# Patient Record
Sex: Male | Born: 1975 | Race: White | Hispanic: No | Marital: Married | Smoking: Never smoker
Health system: Southern US, Community
[De-identification: ages and names within clinical notes are randomized; demographics above are authoritative.]

## PROBLEM LIST (undated history)

## (undated) DIAGNOSIS — E119 Type 2 diabetes mellitus without complications: Secondary | ICD-10-CM

## (undated) DIAGNOSIS — E1142 Type 2 diabetes mellitus with diabetic polyneuropathy: Secondary | ICD-10-CM

## (undated) DIAGNOSIS — Z800 Family history of malignant neoplasm of digestive organs: Secondary | ICD-10-CM

## (undated) DIAGNOSIS — K602 Anal fissure, unspecified: Secondary | ICD-10-CM

## (undated) DIAGNOSIS — Z860100 Personal history of colon polyps, unspecified: Secondary | ICD-10-CM

## (undated) DIAGNOSIS — K219 Gastro-esophageal reflux disease without esophagitis: Secondary | ICD-10-CM

## (undated) DIAGNOSIS — T7840XA Allergy, unspecified, initial encounter: Secondary | ICD-10-CM

## (undated) HISTORY — DX: Personal history of colon polyps, unspecified: Z86.0100

## (undated) HISTORY — DX: Anal fissure, unspecified: K60.2

## (undated) HISTORY — DX: Allergy, unspecified, initial encounter: T78.40XA

## (undated) HISTORY — DX: Family history of malignant neoplasm of digestive organs: Z80.0

## (undated) HISTORY — DX: Type 2 diabetes mellitus with diabetic polyneuropathy: E11.42

## (undated) HISTORY — DX: Personal history of colonic polyps: Z86.010

---
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
## 2004-07-22 ENCOUNTER — Emergency Department: Payer: Self-pay | Admitting: Unknown Physician Specialty

## 2006-02-22 ENCOUNTER — Ambulatory Visit: Payer: Self-pay | Admitting: Unknown Physician Specialty

## 2013-11-30 ENCOUNTER — Ambulatory Visit: Payer: Self-pay

## 2013-11-30 LAB — RAPID STREP-A WITH REFLX: Micro Text Report: NEGATIVE

## 2013-11-30 LAB — CBC WITH DIFFERENTIAL/PLATELET
Basophil #: 0.1 10*3/uL (ref 0.0–0.1)
Basophil %: 0.5 %
Eosinophil #: 0.2 10*3/uL (ref 0.0–0.7)
Eosinophil %: 1.5 %
HCT: 46.9 % (ref 40.0–52.0)
HGB: 16 g/dL (ref 13.0–18.0)
Lymphocyte #: 3 10*3/uL (ref 1.0–3.6)
Lymphocyte %: 24.8 %
MCH: 29.4 pg (ref 26.0–34.0)
MCHC: 34 g/dL (ref 32.0–36.0)
MCV: 86 fL (ref 80–100)
Monocyte #: 0.7 x10 3/mm (ref 0.2–1.0)
Monocyte %: 6 %
Neutrophil #: 8.1 10*3/uL — ABNORMAL HIGH (ref 1.4–6.5)
Neutrophil %: 67.2 %
Platelet: 287 10*3/uL (ref 150–440)
RBC: 5.44 10*6/uL (ref 4.40–5.90)
RDW: 13.3 % (ref 11.5–14.5)
WBC: 12.1 10*3/uL — ABNORMAL HIGH (ref 3.8–10.6)

## 2013-11-30 LAB — MONONUCLEOSIS SCREEN: Mono Test: NEGATIVE

## 2013-12-03 LAB — BETA STREP CULTURE(ARMC)

## 2014-06-19 ENCOUNTER — Ambulatory Visit (INDEPENDENT_AMBULATORY_CARE_PROVIDER_SITE_OTHER): Payer: 59 | Admitting: Cardiovascular Disease

## 2014-06-19 ENCOUNTER — Encounter (INDEPENDENT_AMBULATORY_CARE_PROVIDER_SITE_OTHER): Payer: Self-pay

## 2014-06-19 ENCOUNTER — Encounter: Payer: Self-pay | Admitting: Cardiovascular Disease

## 2014-06-19 VITALS — BP 151/108 | HR 71 | Ht 76.0 in | Wt 256.8 lb

## 2014-06-19 DIAGNOSIS — IMO0001 Reserved for inherently not codable concepts without codable children: Secondary | ICD-10-CM

## 2014-06-19 DIAGNOSIS — R030 Elevated blood-pressure reading, without diagnosis of hypertension: Secondary | ICD-10-CM

## 2014-06-19 DIAGNOSIS — E785 Hyperlipidemia, unspecified: Secondary | ICD-10-CM

## 2014-06-19 DIAGNOSIS — Z8349 Family history of other endocrine, nutritional and metabolic diseases: Secondary | ICD-10-CM

## 2014-06-19 DIAGNOSIS — Z83438 Family history of other disorder of lipoprotein metabolism and other lipidemia: Secondary | ICD-10-CM

## 2014-06-19 DIAGNOSIS — I10 Essential (primary) hypertension: Secondary | ICD-10-CM

## 2014-06-19 NOTE — Assessment & Plan Note (Signed)
It is possible that he might have white coat syndrome. He also has not had any routine labs done in the last few years. Thus, I ordered CBC, basic metabolic profile and liver profile. I asked him to check his blood pressure at home twice weekly over the next few weeks and provide [place] with readings. If blood pressure is elevated at home, I recommend lifestyle changes with low-sodium diet, exercise and weight loss.

## 2014-06-19 NOTE — Progress Notes (Signed)
   HPI  Tanner Frye is a pleasant 38 year old male who is here to establish cardiovascular care. He has no previous cardiac history on no chronic medical conditions. He has family history of hypertension and hyperlipidemia but no premature coronary artery disease. He has known history of GERD. He is not a smoker. He denies any chest pain, shortness of breath, palpitations or dizziness. He does not have a primary care physician.  Allergies  Allergen Reactions  . Bee Venom   . Prednisone     Other reaction(s): Other (See Comments) Effects mood verbal per patient     No current outpatient prescriptions on file prior to visit.   No current facility-administered medications on file prior to visit.     History reviewed. No pertinent past medical history.   Past Surgical History  Procedure Laterality Date  . Hernia repair       Family History  Problem Relation Age of Onset  . Hypertension Mother   . Hyperlipidemia Mother      History   Social History  . Marital Status: Married    Spouse Name: N/A    Number of Children: N/A  . Years of Education: N/A   Occupational History  . Not on file.   Social History Main Topics  . Smoking status: Never Smoker   . Smokeless tobacco: Not on file  . Alcohol Use: Yes     Comment: social  . Drug Use: No  . Sexual Activity: Not on file   Other Topics Concern  . Not on file   Social History Narrative  . No narrative on file     ROS A 10 point review of system was performed. It is negative other than that mentioned in the history of present illness.   PHYSICAL EXAM   BP 151/108  Pulse 71  Ht 6\' 4"  (1.93 m)  Wt 256 lb 12 oz (116.461 kg)  BMI 31.27 kg/m2 Constitutional: He is oriented to person, place, and time. He appears well-developed and well-nourished. No distress.  HENT: No nasal discharge.  Head: Normocephalic and atraumatic.  Eyes: Pupils are equal and round.  No discharge. Neck: Normal range of motion. Neck supple.  No JVD present. No thyromegaly present.  Cardiovascular: Normal rate, regular rhythm, normal heart sounds. Exam reveals no gallop and no friction rub. No murmur heard.  Pulmonary/Chest: Effort normal and breath sounds normal. No stridor. No respiratory distress. He has no wheezes. He has no rales. He exhibits no tenderness.  Abdominal: Soft. Bowel sounds are normal. He exhibits no distension. There is no tenderness. There is no rebound and no guarding.  Musculoskeletal: Normal range of motion. He exhibits no edema and no tenderness.  Neurological: He is alert and oriented to person, place, and time. Coordination normal.  Skin: Skin is warm and dry. No rash noted. He is not diaphoretic. No erythema. No pallor.  Psychiatric: He has a normal mood and affect. His behavior is normal. Judgment and thought content normal.       UXN:ATFTD  Rhythm  -RSR(V1) -nondiagnostic.   PROBABLY NORMAL   ASSESSMENT AND PLAN

## 2014-06-19 NOTE — Assessment & Plan Note (Signed)
I requested a fasting lipid profile.

## 2014-06-19 NOTE — Patient Instructions (Signed)
Your physician recommends that you have labs today: CBC BMP Lipid and Liver Panel   Your physician wants you to follow-up in: 1 year with Dr. Fletcher Anon. You will receive a reminder letter in the mail two months in advance. If you don't receive a letter, please call our office to schedule the follow-up appointment.  Please check your blood pressure at home twice a week for 1 month and send [place] your results

## 2014-06-20 ENCOUNTER — Telehealth: Payer: Self-pay | Admitting: *Deleted

## 2014-06-20 DIAGNOSIS — E785 Hyperlipidemia, unspecified: Secondary | ICD-10-CM

## 2014-06-20 DIAGNOSIS — R945 Abnormal results of liver function studies: Secondary | ICD-10-CM

## 2014-06-20 LAB — CBC WITH DIFFERENTIAL
Basophils Absolute: 0 10*3/uL (ref 0.0–0.2)
Basos: 0 %
Eos: 1 %
Eosinophils Absolute: 0.2 10*3/uL (ref 0.0–0.4)
HCT: 43.2 % (ref 37.5–51.0)
Hemoglobin: 15 g/dL (ref 12.6–17.7)
Immature Grans (Abs): 0 10*3/uL (ref 0.0–0.1)
Immature Granulocytes: 0 %
Lymphocytes Absolute: 3.4 10*3/uL — ABNORMAL HIGH (ref 0.7–3.1)
Lymphs: 32 %
MCH: 29.3 pg (ref 26.6–33.0)
MCHC: 34.7 g/dL (ref 31.5–35.7)
MCV: 84 fL (ref 79–97)
Monocytes Absolute: 0.9 10*3/uL (ref 0.1–0.9)
Monocytes: 9 %
Neutrophils Absolute: 6.1 10*3/uL (ref 1.4–7.0)
Neutrophils Relative %: 58 %
Platelets: 268 10*3/uL (ref 150–379)
RBC: 5.12 x10E6/uL (ref 4.14–5.80)
RDW: 14.1 % (ref 12.3–15.4)
WBC: 10.7 10*3/uL (ref 3.4–10.8)

## 2014-06-20 LAB — LIPID PANEL
Chol/HDL Ratio: 5.8 ratio units — ABNORMAL HIGH (ref 0.0–5.0)
Cholesterol, Total: 244 mg/dL — ABNORMAL HIGH (ref 100–199)
HDL: 42 mg/dL (ref >39)
LDL Calculated: 124 mg/dL — ABNORMAL HIGH (ref 0–99)
Triglycerides: 391 mg/dL — ABNORMAL HIGH (ref 0–149)
VLDL Cholesterol Cal: 78 mg/dL — ABNORMAL HIGH (ref 5–40)

## 2014-06-20 LAB — HEPATIC FUNCTION PANEL
ALT: 80 IU/L — ABNORMAL HIGH (ref 0–44)
AST: 47 IU/L — ABNORMAL HIGH (ref 0–40)
Albumin: 4.5 g/dL (ref 3.5–5.5)
Alkaline Phosphatase: 41 IU/L (ref 39–117)
Bilirubin, Direct: 0.19 mg/dL (ref 0.00–0.40)
Total Bilirubin: 0.8 mg/dL (ref 0.0–1.2)
Total Protein: 6.8 g/dL (ref 6.0–8.5)

## 2014-06-20 LAB — BASIC METABOLIC PANEL
BUN/Creatinine Ratio: 13 (ref 8–19)
BUN: 11 mg/dL (ref 6–20)
CO2: 23 mmol/L (ref 18–29)
Calcium: 9.8 mg/dL (ref 8.7–10.2)
Chloride: 98 mmol/L (ref 97–108)
Creatinine, Ser: 0.88 mg/dL (ref 0.76–1.27)
GFR calc Af Amer: 127 mL/min/{1.73_m2} (ref >59)
GFR calc non Af Amer: 110 mL/min/{1.73_m2} (ref >59)
Glucose: 82 mg/dL (ref 65–99)
Potassium: 4.1 mmol/L (ref 3.5–5.2)
Sodium: 139 mmol/L (ref 134–144)

## 2014-06-20 MED ORDER — FISH OIL 1000 MG PO CPDR: 300.0000 | DELAYED_RELEASE_CAPSULE | Freq: Every day | ORAL | Status: DC

## 2014-06-20 NOTE — Telephone Encounter (Signed)
Message copied by Tracie Harrier on Wed Jun 20, 2014  9:45 AM ------      Message from: Kathlyn Sacramento A      Created: Wed Jun 20, 2014  9:03 AM       Inform patient that labs were normal except for mildly abnormal liver test. How much alcohol does he drink exactly? Any previous liver issues.  Cholesterol and triglycerdies were both high. He needs the low carb low fat diet, exercise and some weight loss. Start Fish oil 3 gram daily. We should repeat liver and lipid in 3 months. ------

## 2014-06-20 NOTE — Telephone Encounter (Signed)
Message copied by Tracie Harrier on Wed Jun 20, 2014  1:46 PM ------      Message from: Kathlyn Sacramento A      Created: Wed Jun 20, 2014 10:08 AM       Order hepatitis b and c tests. Order liver ultrasound ------

## 2014-06-21 ENCOUNTER — Other Ambulatory Visit: Payer: Self-pay | Admitting: Cardiovascular Disease

## 2014-06-21 ENCOUNTER — Other Ambulatory Visit: Payer: Self-pay

## 2014-06-21 ENCOUNTER — Telehealth: Payer: Self-pay | Admitting: *Deleted

## 2014-06-21 DIAGNOSIS — R945 Abnormal results of liver function studies: Secondary | ICD-10-CM

## 2014-06-21 DIAGNOSIS — R748 Abnormal levels of other serum enzymes: Secondary | ICD-10-CM

## 2014-06-21 NOTE — Telephone Encounter (Signed)
You are scheduled for an Abdominal Ultrasound on 06/26/14 at 0945 am at [facility]  Please arrive at the registration desk in the medical mall  Nothing to eat or drink after midnight the night before  Please have labs at that time as well

## 2014-06-21 NOTE — Telephone Encounter (Signed)
Message copied by Tracie Harrier on Thu Jun 21, 2014 12:09 PM ------      Message from: Kathlyn Sacramento A      Created: Wed Jun 20, 2014 10:08 AM       Order hepatitis b and c tests. Order liver ultrasound ------

## 2014-06-26 ENCOUNTER — Other Ambulatory Visit: Payer: Self-pay | Admitting: Cardiovascular Disease

## 2014-06-26 ENCOUNTER — Telehealth: Payer: Self-pay | Admitting: *Deleted

## 2014-06-26 NOTE — Telephone Encounter (Signed)
Patient accidentally forgot to remain NPO this morning for abd [place]  He is still getting labs today  Ultrasound rescheduled for 06/29/14 0900  Patient aware

## 2014-06-27 ENCOUNTER — Other Ambulatory Visit: Payer: Self-pay

## 2014-06-27 DIAGNOSIS — R945 Abnormal results of liver function studies: Secondary | ICD-10-CM

## 2014-06-29 ENCOUNTER — Ambulatory Visit: Payer: Self-pay | Admitting: Cardiovascular Disease

## 2014-07-13 ENCOUNTER — Other Ambulatory Visit: Payer: Self-pay

## 2014-07-13 DIAGNOSIS — E785 Hyperlipidemia, unspecified: Secondary | ICD-10-CM

## 2014-08-25 ENCOUNTER — Ambulatory Visit: Payer: 59

## 2014-08-25 ENCOUNTER — Ambulatory Visit (INDEPENDENT_AMBULATORY_CARE_PROVIDER_SITE_OTHER): Payer: 59 | Admitting: *Deleted

## 2014-08-25 DIAGNOSIS — Z23 Encounter for immunization: Secondary | ICD-10-CM

## 2014-09-19 ENCOUNTER — Other Ambulatory Visit: Payer: 59

## 2014-09-24 ENCOUNTER — Telehealth: Payer: Self-pay | Admitting: *Deleted

## 2014-09-24 MED ORDER — DICLOFENAC SODIUM 75 MG PO TBEC: 75.0000 mg | DELAYED_RELEASE_TABLET | Freq: Two times a day (BID) | ORAL | Status: DC

## 2014-09-24 NOTE — Telephone Encounter (Signed)
Per patient he is out of his gout medication  He is in severe pain  He asked if Dr. Fletcher Anon will refill once   Per telephone call with Dr. Fletcher Anon  He stated it was all right to refill medication one time   Patient aware

## 2014-10-26 ENCOUNTER — Telehealth: Payer: Self-pay | Admitting: *Deleted

## 2014-10-26 MED ORDER — DICLOFENAC SODIUM 75 MG PO TBEC: 75.0000 mg | DELAYED_RELEASE_TABLET | Freq: Two times a day (BID) | ORAL | Status: DC

## 2014-10-26 NOTE — Telephone Encounter (Signed)
Please refill Voltaren per verbal from Dr. Fletcher Anon

## 2014-10-28 ENCOUNTER — Telehealth: Payer: 59 | Admitting: Family

## 2014-10-28 DIAGNOSIS — J0120 Acute ethmoidal sinusitis, unspecified: Secondary | ICD-10-CM

## 2014-10-28 MED ORDER — AMOXICILLIN-POT CLAVULANATE 875-125 MG PO TABS: 1.0000 | ORAL_TABLET | Freq: Two times a day (BID) | ORAL | Status: DC

## 2014-10-28 NOTE — Progress Notes (Signed)

## 2015-08-15 ENCOUNTER — Telehealth: Payer: Self-pay | Admitting: Cardiovascular Disease

## 2015-08-15 NOTE — Telephone Encounter (Signed)
Patient does not want to schedule at this time per last phone call.  Will call when ready to be seen.     Deleting recall.

## 2015-09-13 ENCOUNTER — Telehealth: Payer: 59 | Admitting: Family

## 2015-09-13 DIAGNOSIS — J019 Acute sinusitis, unspecified: Secondary | ICD-10-CM

## 2015-09-13 MED ORDER — AMOXICILLIN-POT CLAVULANATE 875-125 MG PO TABS: 1.0000 | ORAL_TABLET | Freq: Two times a day (BID) | ORAL | Status: DC

## 2015-09-13 NOTE — Progress Notes (Signed)
We are sorry that you are not feeling well.  Here is how we plan to help!  Based on what you have shared with me it looks like you have sinusitis.  Sinusitis is inflammation and infection in the sinus cavities of the head.  Based on your presentation I believe you most likely have Acute Bacterial Sinusitis.  This is an infection caused by bacteria and is treated with antibiotics. I have prescribed Augmentin, an antibiotic in the penicillin family, one tablet twice daily with food, for 7 days. You may use an oral decongestant such as Mucinex D or if you have glaucoma or high blood pressure use plain Mucinex. Saline nasal spray help and can safely be used as often as needed for congestion.  If you develop worsening sinus pain, fever or notice severe headache and vision changes, or if symptoms are not better after completion of antibiotic, please schedule an appointment with a health care provider.    Sinus infections are not as easily transmitted as other respiratory infection, however we still recommend that you avoid close contact with loved ones, especially the very young and elderly.  Remember to wash your hands thoroughly throughout the day as this is the number one way to prevent the spread of infection!  Home Care:  Only take medications as instructed by your medical team.  Complete the entire course of an antibiotic.  Do not take these medications with alcohol.  A steam or ultrasonic humidifier can help congestion.  You can place a towel over your head and breathe in the steam from hot water coming from a faucet.  Avoid close contacts especially the very young and the elderly.  Cover your mouth when you cough or sneeze.  Always remember to wash your hands.  Get Help Right Away If:  You develop worsening fever or sinus pain.  You develop a severe head ache or visual changes.  Your symptoms persist after you have completed your treatment plan.  Make sure you  Understand these  instructions.  Will watch your condition.  Will get help right away if you are not doing well or get worse.  Your e-visit answers were reviewed by a board certified advanced clinical practitioner to complete your personal care plan.  Depending on the condition, your plan could have included both over the counter or prescription medications.  If there is a problem please reply  once you have received a response from your provider.  Your safety is important to [place].  If you have drug allergies check your prescription carefully.    You can use MyChart to ask questions about today's visit, request a non-urgent call back, or ask for a work or school excuse for 24 hours related to this e-Visit. If it has been greater than 24 hours you will need to follow up with your provider, or enter a new e-Visit to address those concerns.  You will get an e-mail in the next two days asking about your experience.  I hope that your e-visit has been valuable and will speed your recovery. Thank you for using e-visits.

## 2015-09-30 ENCOUNTER — Other Ambulatory Visit: Payer: Self-pay

## 2015-09-30 DIAGNOSIS — E785 Hyperlipidemia, unspecified: Secondary | ICD-10-CM

## 2015-09-30 DIAGNOSIS — Z8249 Family history of ischemic heart disease and other diseases of the circulatory system: Secondary | ICD-10-CM

## 2015-10-08 ENCOUNTER — Ambulatory Visit (INDEPENDENT_AMBULATORY_CARE_PROVIDER_SITE_OTHER)
Admission: RE | Admit: 2015-10-08 | Discharge: 2015-10-08 | Disposition: A | Discharge status: Home / Self Care | Payer: 59 | Source: Ambulatory Visit | Attending: Cardiovascular Disease | Admitting: Cardiovascular Disease

## 2015-10-08 DIAGNOSIS — E785 Hyperlipidemia, unspecified: Secondary | ICD-10-CM

## 2015-10-08 DIAGNOSIS — Z8249 Family history of ischemic heart disease and other diseases of the circulatory system: Secondary | ICD-10-CM

## 2015-10-15 ENCOUNTER — Other Ambulatory Visit: Payer: Self-pay

## 2015-10-15 DIAGNOSIS — R918 Other nonspecific abnormal finding of lung field: Secondary | ICD-10-CM

## 2016-02-17 DIAGNOSIS — J305 Allergic rhinitis due to food: Secondary | ICD-10-CM | POA: Diagnosis not present

## 2016-02-17 DIAGNOSIS — T781XXA Other adverse food reactions, not elsewhere classified, initial encounter: Secondary | ICD-10-CM | POA: Diagnosis not present

## 2016-03-20 DIAGNOSIS — D229 Melanocytic nevi, unspecified: Secondary | ICD-10-CM | POA: Diagnosis not present

## 2016-03-20 DIAGNOSIS — Z1283 Encounter for screening for malignant neoplasm of skin: Secondary | ICD-10-CM | POA: Diagnosis not present

## 2016-03-20 DIAGNOSIS — L821 Other seborrheic keratosis: Secondary | ICD-10-CM | POA: Diagnosis not present

## 2016-03-20 DIAGNOSIS — L814 Other melanin hyperpigmentation: Secondary | ICD-10-CM | POA: Diagnosis not present

## 2016-07-09 DIAGNOSIS — H5213 Myopia, bilateral: Secondary | ICD-10-CM | POA: Diagnosis not present

## 2016-12-02 ENCOUNTER — Ambulatory Visit (INDEPENDENT_AMBULATORY_CARE_PROVIDER_SITE_OTHER): Payer: 59

## 2016-12-02 ENCOUNTER — Encounter: Payer: Self-pay | Admitting: Podiatry

## 2016-12-02 ENCOUNTER — Ambulatory Visit (INDEPENDENT_AMBULATORY_CARE_PROVIDER_SITE_OTHER): Payer: 59 | Admitting: Podiatry

## 2016-12-02 VITALS — BP 152/102 | HR 98 | Resp 16

## 2016-12-02 DIAGNOSIS — G5762 Lesion of plantar nerve, left lower limb: Secondary | ICD-10-CM | POA: Diagnosis not present

## 2016-12-02 DIAGNOSIS — M722 Plantar fascial fibromatosis: Secondary | ICD-10-CM

## 2016-12-02 NOTE — Progress Notes (Signed)
   Subjective:    Patient ID: Tanner Frye, male    DOB: May 08, 1976, 41 y.o.   MRN: [id number]  HPI: He presents today with a chief complaint of pain from his forefoot to his arch. He states this been going on for greater than 3 years now on the left foot hurts worse than the right foot. He states that his been aching for 3 years initially just the left foot hurting and then it went to the right foot. He saw another podiatrist locally who injected the bilateral heels thinking this is plantar fasciitis and was dispensed orthotics. He states that the orthotics were hardly salt another opinion from [facility] where they recommended new balance shoes and helped with shockwave therapy. He states that eventually his toe started to go numb solid another surgeon who recommended injection therapy to no avail.    Review of Systems  Musculoskeletal: Positive for gait problem.  All other systems reviewed and are negative.      Objective:   Physical Exam: I have reviewed his past mental history medications allergies surgery social history and review of systems. He appears to be aggravated with his feet but no apparent distress. Pulses are strongly palpable. Neurologic sensorium is intact. Deep tendon reflexes are intact. Muscle strength +5 over 5 dorsiflexion plantar flexors and inverters everters all into the musculature is intact. Repeat evaluation demonstrate all joints distal to the ankle for range of motion crepitation mild to moderate flexible pes planus is noted bilateral. He has pain on palpation to the third interdigital space of the left foot and generalized tenderness on palpation of the lesser metatarsophalangeal joints left greater than right. He also has pain on palpation to the medial calcaneal tubercle of the bilateral heels left greater than right. Radiographs 3 views taken in the office today demonstrate osseous limited to her feet pes planus is noted. Soft tissue increase in density of  his noted at the plantar fascia calcaneal insertion site.        Assessment & Plan:  Plantar fasciitis of the bilateral foot left greater than right with neuroma third interdigital space of the left foot and capsulitis most likely compensatory to the lesser metatarsophalangeal joints bilaterally.  Plan: Discussed etiology pathology conservative versus surgical therapies. At this point I highly recommended an MRI of the left foot since that seems to be the worst foot and we will extrapolate to the right foot because of similar symptoms. He states that he is ready for surgical intervention at best necessary which may consist of gastroc recession and an endoscopic plantar fasciotomy. We are requesting MRI based on the failure of conservative therapies from multiple physicians over the past 3 years.

## 2016-12-02 NOTE — Patient Instructions (Signed)

## 2016-12-03 ENCOUNTER — Telehealth: Payer: Self-pay | Admitting: *Deleted

## 2016-12-03 DIAGNOSIS — M722 Plantar fascial fibromatosis: Secondary | ICD-10-CM

## 2016-12-03 NOTE — Telephone Encounter (Addendum)
-----   Message from Mack Hook, LPN sent at [phone]  5:13 PM EST ----- Dr. Milinda Pointer request MRI left foot dx Plantar fasciitis and Neuroma left.  Order is in [place]. 12/03/2016-Faxed orders to [facility] and [facility] for pre-cert.12/18/2016-Pt's wife, Leafy Ro states she has already gone into Patient Experience concerning her husband's MRI was of the wrong part of the foot, and he is to have another MRI, and she would like to discuss this with someone. I spoke with pt's wife, Leafy Ro after she spoke with B. Bonkemeyer - [facility] [place], and told her I would have [facility] call with pt's new appt for MRI. Faxed MRI to [facility] 12/21/2016-Faxed orders to [facility] for [employer]. 0000000 wife, Leafy Ro states pt is having to wait for correct MRI of left ankle, until the incorrect MRI of the toes is taken care of. Leafy Ro states they are not able to pay the $800.00 again they paid for the incorrect MRI. I told Leafy Ro I would inform B. Bonkemeyer of her call and give the name of Pt Experience- Philipp Ovens [phone], she has been working with. I left message for B. Bonkemeyer with Mandy's concern and asked if there was anything I could do. 01/11/2017-B. Bonkemeyer states make this right for the pt with [facility], if the charges need to be absorbed by our office.[place], I informed her of the MRI of left foot not being the view Dr. Milinda Pointer had wanted and Vickii Chafe states her supervisor would need to speak to me. I spoke with April [facility] and she reviewed the orders and states our office initially ordered the MRI left foot, I would need to speak with her supervisor and she would call me again.01/12/2017-Kenisha - [place] Scheduling transferred to April - Scheduling Supervisor, I left a message reminding her that she was to have her supervisor to call me concerning a MRI that needed completion. I spoke with April - Scheduling Supervisor and explained that after speaking with Dr. Milinda Pointer, his explanation is that the  initial MRI was ordered for the neuroma and plantar fasciitis and the exam did not run the entire course of the plantar fascia, the plantar fascia goes all the way to where it is connected to the heel bone. I told her Dr. Milinda Pointer stated the MRI should be performed without cost to the pt and to include the entire plantar fascia. April states she will have the MRI supervisor - Romelle Starcher call our office to discuss. April - Scheduling called prior to my 3:09pm call and left message stating Lily Peer [phone] would call me. 01/14/2017-Rodney Jimmye Norman states as he has reviewed the pt's orders, the radiology technician should have reviewed the orders and performed the MRI to cover the entire plantar fasica, there has been in-office training to clarify technicians evaluation of orders. Mr. Jimmye Norman states he will contact pt and have return for completion of the MRI, that will appear as an addentum to the order for the radiologist to read and not charge pt. I thanked Mr. Jimmye Norman for his help and informed B. Bonkemeyer Teacher, music.02/01/2017-Mailed copy of MRI of left foot with addendum and copy of report with addendum to pt.

## 2016-12-04 NOTE — Telephone Encounter (Signed)
Insurance does not require PA for MRI.  I contacted [facility] they will contact patient to schedule MRI appt.

## 2016-12-11 ENCOUNTER — Ambulatory Visit
Admission: RE | Admit: 2016-12-11 | Discharge: 2016-12-11 | Disposition: A | Discharge status: Home / Self Care | Payer: 59 | Source: Ambulatory Visit | Attending: Podiatry | Admitting: Podiatry

## 2016-12-11 DIAGNOSIS — G5762 Lesion of plantar nerve, left lower limb: Secondary | ICD-10-CM | POA: Diagnosis not present

## 2016-12-11 DIAGNOSIS — M2010 Hallux valgus (acquired), unspecified foot: Secondary | ICD-10-CM | POA: Insufficient documentation

## 2016-12-11 DIAGNOSIS — M2012 Hallux valgus (acquired), left foot: Secondary | ICD-10-CM | POA: Diagnosis not present

## 2016-12-11 DIAGNOSIS — M722 Plantar fascial fibromatosis: Secondary | ICD-10-CM | POA: Diagnosis not present

## 2016-12-14 ENCOUNTER — Other Ambulatory Visit: Payer: Self-pay

## 2016-12-14 DIAGNOSIS — M722 Plantar fascial fibromatosis: Secondary | ICD-10-CM

## 2016-12-17 ENCOUNTER — Telehealth: Payer: Self-pay

## 2016-12-17 NOTE — Telephone Encounter (Signed)
I called and spoke with patient regarding getting another MRI of left heel.  Per Dr. Milinda Pointer, he request MRI left heel w/o contrast, the first MRI was done of left foot.   No precert required for MRI.  Patient informed to call radiology scheduling dept to schedule appt, then after MRI patient can call for follow up appt with Dr. Milinda Pointer.

## 2016-12-20 ENCOUNTER — Telehealth: Payer: 59 | Admitting: Family

## 2016-12-20 DIAGNOSIS — J019 Acute sinusitis, unspecified: Secondary | ICD-10-CM | POA: Diagnosis not present

## 2016-12-20 MED ORDER — AMOXICILLIN-POT CLAVULANATE 875-125 MG PO TABS: 1.0000 | ORAL_TABLET | Freq: Two times a day (BID) | ORAL | 0 refills | Status: DC

## 2016-12-20 NOTE — Progress Notes (Signed)

## 2016-12-22 ENCOUNTER — Encounter: Payer: Self-pay | Admitting: Family Medicine

## 2016-12-22 ENCOUNTER — Ambulatory Visit (INDEPENDENT_AMBULATORY_CARE_PROVIDER_SITE_OTHER): Payer: 59 | Admitting: Family Medicine

## 2016-12-22 VITALS — BP 142/95 | HR 78 | Temp 98.4°F | Ht 75.5 in | Wt 270.2 lb

## 2016-12-22 DIAGNOSIS — J111 Influenza due to unidentified influenza virus with other respiratory manifestations: Secondary | ICD-10-CM | POA: Diagnosis not present

## 2016-12-22 LAB — POC INFLUENZA A&B (BINAX/QUICKVUE)
Influenza A, POC: NEGATIVE
Influenza B, POC: NEGATIVE

## 2016-12-22 MED ORDER — OSELTAMIVIR PHOSPHATE 75 MG PO CAPS: 75.0000 mg | ORAL_CAPSULE | Freq: Two times a day (BID) | ORAL | 0 refills | Status: DC

## 2016-12-22 NOTE — Progress Notes (Signed)
   Subjective:  Patient ID: Tanner Frye, male    DOB: [birth year]  Age: 41 y.o. MRN: [id number]  CC: Concern for flu  HPI Tanner Frye is a 41 y.o. male presents to the clinic today with concerns for influenza.  Patient reports that he's been sick since Sunday. Started with sinus pressure/congestion. He had an E visit and was given Augmentin for sinusitis. He states that since that time he has continued to feel poorly. Symptoms are severe.  He has developed cough, chills, body aches. He has had a recent exposure to influenza and he is concerned that he is developing influenza. No documented fever. No other associated symptoms. He's had no improvement with Augmentin. No known exacerbating factors. No other complaints or concerns at this time.  PMH, Surgical Hx, Family Hx, Social History reviewed and updated as below.  Past Medical History:  Diagnosis Date  . Elevated BP without diagnosis of hypertension     Past Surgical History:  Procedure Laterality Date  . HERNIA REPAIR     Family History  Problem Relation Age of Onset  . Hypertension Mother   . Hyperlipidemia Mother    Social History  Substance Use Topics  . Smoking status: Never Smoker  . Smokeless tobacco: Never Used  . Alcohol use Yes     Comment: social   Review of Systems  Constitutional: Positive for chills.  HENT: Positive for congestion.   Musculoskeletal:       Body aches.   All other systems reviewed and are negative.  Objective:   Today's Vitals: BP (!) 142/95   Pulse 78   Temp 98.4 F (36.9 C) (Oral)   Ht 6' 3.5" (1.918 m)   Wt 270 lb 3.2 oz (122.6 kg)   SpO2 96%   BMI 33.33 kg/m   Physical Exam  Constitutional: He is oriented to person, place, and time. He appears well-developed. No distress.  HENT:  Head: Normocephalic and atraumatic.  Mouth/Throat: Oropharynx is clear and moist.  No sinus tenderness.  Eyes: Conjunctivae are normal.  Neck: Neck supple.  Cardiovascular: Normal  rate and regular rhythm.   Pulmonary/Chest: Effort normal and breath sounds normal.  Abdominal: Soft. He exhibits no distension. There is no tenderness.  Musculoskeletal: Normal range of motion.  Neurological: He is alert and oriented to person, place, and time.  Skin: No rash noted.  Psychiatric: He has a normal mood and affect.  Vitals reviewed.  Assessment & Plan:   Problem List Items Addressed This Visit    Influenza - Primary    New problem. Rapid test negative today but given exposure and symptoms will treat empirically for flu. Rx for tamiflu sent.      Relevant Medications   oseltamivir (TAMIFLU) 75 MG capsule   Other Relevant Orders   POC Influenza A&B(BINAX/QUICKVUE) (Completed)     Outpatient Encounter Prescriptions as of 12/22/2016  Medication Sig  . amoxicillin-clavulanate (AUGMENTIN) 875-125 MG tablet Take 1 tablet by mouth 2 (two) times daily.  . Multiple Vitamin (MULTIVITAMIN) tablet Take 1 tablet by mouth daily.  . Omega-3 Fatty Acids (FISH OIL PO) Take by mouth.  Marland Kitchen omeprazole (PRILOSEC) 20 MG capsule Take 20 mg by mouth daily.  Marland Kitchen oseltamivir (TAMIFLU) 75 MG capsule Take 1 capsule (75 mg total) by mouth 2 (two) times daily.   No facility-administered encounter medications on file as of 12/22/2016.     Follow-up: PRN  [place]

## 2016-12-22 NOTE — Patient Instructions (Signed)
Tamiflu as prescribed.  Finish augmentin.  Follow up annually.  Take care  Dr. Lacinda Axon

## 2016-12-22 NOTE — Assessment & Plan Note (Signed)
New problem. Rapid test negative today but given exposure and symptoms will treat empirically for flu. Rx for tamiflu sent.

## 2017-01-07 ENCOUNTER — Telehealth: Payer: Self-pay | Admitting: Podiatry

## 2017-01-26 NOTE — Telephone Encounter (Addendum)
-----   Message from Garrel Ridgel, [place] sent at 01/25/2017  7:00 AM EDT ----- Make sure that he receives a copy of both disks to take with him to [facility].  We will not be seeing him.  Both he and his wife stated that he would not be coming back to [place].01/26/2017-FAxed request of left foot and left foot addendum MRI disc copies.

## 2017-03-22 ENCOUNTER — Other Ambulatory Visit: Payer: Self-pay

## 2017-03-22 ENCOUNTER — Other Ambulatory Visit
Admission: RE | Admit: 2017-03-22 | Discharge: 2017-03-22 | Disposition: A | Discharge status: Home / Self Care | Payer: 59 | Source: Ambulatory Visit | Attending: Cardiovascular Disease | Admitting: Cardiovascular Disease

## 2017-03-22 DIAGNOSIS — K625 Hemorrhage of anus and rectum: Secondary | ICD-10-CM | POA: Insufficient documentation

## 2017-03-22 LAB — CBC
HCT: 42.4 % (ref 40.0–52.0)
Hemoglobin: 15 g/dL (ref 13.0–18.0)
MCH: 30 pg (ref 26.0–34.0)
MCHC: 35.4 g/dL (ref 32.0–36.0)
MCV: 84.7 fL (ref 80.0–100.0)
Platelets: 262 10*3/uL (ref 150–440)
RBC: 5 MIL/uL (ref 4.40–5.90)
RDW: 13.4 % (ref 11.5–14.5)
WBC: 10.7 10*3/uL — ABNORMAL HIGH (ref 3.8–10.6)

## 2017-03-22 LAB — BASIC METABOLIC PANEL
Anion gap: 7 (ref 5–15)
BUN: 12 mg/dL (ref 6–20)
CO2: 26 mmol/L (ref 22–32)
Calcium: 9.2 mg/dL (ref 8.9–10.3)
Chloride: 103 mmol/L (ref 101–111)
Creatinine, Ser: 0.92 mg/dL (ref 0.61–1.24)
GFR calc Af Amer: >60 mL/min (ref >60)
GFR calc non Af Amer: >60 mL/min (ref >60)
Glucose, Bld: 206 mg/dL — ABNORMAL HIGH (ref 65–99)
Potassium: 3.7 mmol/L (ref 3.5–5.1)
Sodium: 136 mmol/L (ref 135–145)

## 2017-03-24 DIAGNOSIS — M7741 Metatarsalgia, right foot: Secondary | ICD-10-CM | POA: Diagnosis not present

## 2017-03-24 DIAGNOSIS — M7742 Metatarsalgia, left foot: Secondary | ICD-10-CM | POA: Diagnosis not present

## 2017-03-24 DIAGNOSIS — M2142 Flat foot [pes planus] (acquired), left foot: Secondary | ICD-10-CM | POA: Diagnosis not present

## 2017-03-29 ENCOUNTER — Encounter: Payer: Self-pay | Admitting: Gastroenterology

## 2017-03-29 ENCOUNTER — Other Ambulatory Visit: Payer: Self-pay

## 2017-03-29 ENCOUNTER — Ambulatory Visit (INDEPENDENT_AMBULATORY_CARE_PROVIDER_SITE_OTHER): Payer: 59 | Admitting: Gastroenterology

## 2017-03-29 ENCOUNTER — Telehealth: Payer: Self-pay

## 2017-03-29 VITALS — BP 133/82 | HR 67 | Temp 97.6°F | Ht 75.5 in | Wt 262.2 lb

## 2017-03-29 DIAGNOSIS — K625 Hemorrhage of anus and rectum: Secondary | ICD-10-CM

## 2017-03-29 MED ORDER — HYDROCORTISONE ACETATE 25 MG RE SUPP: 25.0000 mg | Freq: Every day | RECTAL | 0 refills | Status: AC

## 2017-03-29 NOTE — Telephone Encounter (Signed)
Gastroenterology Pre-Procedure Review  Request Date: 7/10 Requesting Physician: Dr. Vicente Males  PATIENT REVIEW QUESTIONS: The patient responded to the following health history questions as indicated:    1. Are you having any GI issues? yes (rectal bleeding) 2. Do you have a personal history of Polyps? yes (No: anal fissure) 3. Do you have a family history of Colon Cancer or Polyps? no 4. Diabetes Mellitus? no 5. Joint replacements in the past 12 months?no 6. Major health problems in the past 3 months?no 7. Any artificial heart valves, MVP, or defibrillator?no    MEDICATIONS & ALLERGIES:    Patient reports the following regarding taking any anticoagulation/antiplatelet therapy:   Plavix, Coumadin, Eliquis, Xarelto, Lovenox, Pradaxa, Brilinta, or Effient? no Aspirin? no  Patient confirms/reports the following medications:  Current Outpatient Prescriptions  Medication Sig Dispense Refill  . hydrocortisone (ANUSOL-HC) 25 MG suppository Place 1 suppository (25 mg total) rectally daily. 7 suppository 0  . Multiple Vitamin (MULTIVITAMIN) tablet Take 1 tablet by mouth daily.    . Omega-3 Fatty Acids (FISH OIL PO) Take by mouth.    Marland Kitchen omeprazole (PRILOSEC) 20 MG capsule Take 20 mg by mouth daily.     No current facility-administered medications for this visit.     Patient confirms/reports the following allergies:  Allergies  Allergen Reactions  . Bee Venom   . Prednisone     Other reaction(s): Other (See Comments) Effects mood verbal per patient    No orders of the defined types were placed in this encounter.   AUTHORIZATION INFORMATION Primary Insurance: 1D#: Group #:  Secondary Insurance: 1D#: Group #:  SCHEDULE INFORMATION: Date: 7/10 Time: Location: [place]

## 2017-03-29 NOTE — Progress Notes (Signed)
Tanner Bellows MD, MRCP(U.K) [address]  [place]  [place], [place] [address]  Main: [phone]  Fax: [phone]   Gastroenterology Consultation  Referring Provider:     Coral Spikes, DO Primary Care Physician:  Tanner Spikes, DO Primary Gastroenterologist:  Dr. Jonathon Frye  Reason for Consultation:     Rectal bleeding         HPI:   Tanner Frye is a 41 y.o. y/o male referred for consultation & management  by Dr. Lacinda Frye, Tanner Del, DO.  He has been referred for rectal bleeding .  Hb 15 on 03/22/17   Rectal bleeding :  Onset and where was blood seen  :He says at the age of 8-15 , had anal fissures , on and off had some blood . A month back had a single occurrence of blood , bright red on the tissue and on the stool, a week back he had similar episode with blood clots for 1 day in duration .  Frequency of bowel movements :every day  Consistency : not hard  Change in shape of stool:no  Pain associated with bowel movements:occasionally - when he had the episodes of bleeding cant recall any pain  Blood thinner usage:none  NSAID's: none  Prior colonoscopy :at the age 26  Family history of colon cancer or polyps:not first degree  Weight loss:no  Has tried anusol as a child and cant recall if it helped.  He is not allergic to steroids- it makes him a bit irritable.  Past Medical History:  Diagnosis Date  . Elevated BP without diagnosis of hypertension     Past Surgical History:  Procedure Laterality Date  . HERNIA REPAIR      Prior to Admission medications   Medication Sig Start Date End Date Taking? Authorizing Provider  amoxicillin-clavulanate (AUGMENTIN) 875-125 MG tablet Take 1 tablet by mouth 2 (two) times daily. 12/20/16   Tanner Balloon, FNP  Multiple Vitamin (MULTIVITAMIN) tablet Take 1 tablet by mouth daily.    [provider]  Omega-3 Fatty Acids (FISH OIL PO) Take by mouth.    [provider]  omeprazole (PRILOSEC) 20 MG capsule  Take 20 mg by mouth daily.    [provider]  oseltamivir (TAMIFLU) 75 MG capsule Take 1 capsule (75 mg total) by mouth 2 (two) times daily. 12/22/16   Tanner Spikes, DO    Family History  Problem Relation Age of Onset  . Hypertension Mother   . Hyperlipidemia Mother      Social History  Substance Use Topics  . Smoking status: Never Smoker  . Smokeless tobacco: Never Used  . Alcohol use Yes     Comment: social    Allergies as of 03/29/2017 - Review Complete 12/22/2016  Allergen Reaction Noted  . Bee venom  06/19/2014  . Prednisone  06/19/2014    Review of Systems:    All systems reviewed and negative except where noted in HPI.   Physical Exam:  There were no vitals taken for this visit. No LMP for male patient. Psych:  Alert and cooperative. Normal mood and affect. General:   Alert,  Well-developed, well-nourished, pleasant and cooperative in NAD Head:  Normocephalic and atraumatic. Eyes:  Sclera clear, no icterus.   Conjunctiva pink. Ears:  Normal auditory acuity. Nose:  No deformity, discharge, or lesions. Mouth:  No deformity or lesions,oropharynx pink & moist. Neck:  Supple; no masses or thyromegaly. Lungs:  Respirations even and unlabored.  Clear throughout to auscultation.   No wheezes, crackles, or rhonchi. No acute distress. Heart:  Regular rate and rhythm; no murmurs, clicks, rubs, or gallops. Abdomen:  Normal bowel sounds.  No bruits.  Soft, non-tender and non-distended without masses, hepatosplenomegaly or hernias noted.  No guarding or rebound tenderness.    Pulses:  Normal pulses noted. Extremities:  No clubbing or edema.  No cyanosis. Neurologic:  Alert and oriented x3;  grossly normal neurologically. Psych:  Alert and cooperative. Normal mood and affect.  Imaging Studies: No results found.  Assessment and Plan:   Tanner Frye is a 41 y.o. y/o male has been referred for rectal bleeding  History suggestive of a hemorroidal bleed.    Plan :  1. Colonoscopy  2. Trial of anusol for 7 days  3. High fiber diet and hemoroid patient information provided    I have discussed alternative options, risks & benefits,  which include, but are not limited to, bleeding, infection, perforation,respiratory complication & drug reaction.  The patient agrees with this plan & written consent will be obtained.   Follow up as needed   Dr Tanner Bellows MD,MRCP(U.K)

## 2017-03-30 ENCOUNTER — Telehealth: Payer: Self-pay | Admitting: Gastroenterology

## 2017-03-30 DIAGNOSIS — M2142 Flat foot [pes planus] (acquired), left foot: Secondary | ICD-10-CM | POA: Diagnosis not present

## 2017-03-30 DIAGNOSIS — M7741 Metatarsalgia, right foot: Secondary | ICD-10-CM | POA: Diagnosis not present

## 2017-03-30 DIAGNOSIS — M7742 Metatarsalgia, left foot: Secondary | ICD-10-CM | POA: Diagnosis not present

## 2017-03-30 NOTE — Telephone Encounter (Signed)
No prior auth required for Colonoscopy [phone] / K62.5 for [facility]).

## 2017-04-06 DIAGNOSIS — M7742 Metatarsalgia, left foot: Secondary | ICD-10-CM | POA: Diagnosis not present

## 2017-04-06 DIAGNOSIS — M2142 Flat foot [pes planus] (acquired), left foot: Secondary | ICD-10-CM | POA: Diagnosis not present

## 2017-04-06 DIAGNOSIS — M7741 Metatarsalgia, right foot: Secondary | ICD-10-CM | POA: Diagnosis not present

## 2017-04-27 ENCOUNTER — Encounter: Payer: Self-pay | Admitting: *Deleted

## 2017-04-27 ENCOUNTER — Ambulatory Visit: Payer: 59 | Admitting: Certified Registered"

## 2017-04-27 ENCOUNTER — Ambulatory Visit
Admission: RE | Admit: 2017-04-27 | Discharge: 2017-04-27 | Disposition: A | Discharge status: Home / Self Care | Payer: 59 | Source: Ambulatory Visit | Attending: Gastroenterology | Admitting: Gastroenterology

## 2017-04-27 ENCOUNTER — Encounter
Admission: RE | Disposition: A | Discharge status: Home / Self Care | Payer: Self-pay | Source: Ambulatory Visit | Attending: Gastroenterology

## 2017-04-27 DIAGNOSIS — K621 Rectal polyp: Secondary | ICD-10-CM | POA: Diagnosis not present

## 2017-04-27 DIAGNOSIS — Z79899 Other long term (current) drug therapy: Secondary | ICD-10-CM | POA: Insufficient documentation

## 2017-04-27 DIAGNOSIS — K640 First degree hemorrhoids: Secondary | ICD-10-CM | POA: Diagnosis not present

## 2017-04-27 DIAGNOSIS — K625 Hemorrhage of anus and rectum: Secondary | ICD-10-CM | POA: Insufficient documentation

## 2017-04-27 DIAGNOSIS — K648 Other hemorrhoids: Secondary | ICD-10-CM | POA: Diagnosis not present

## 2017-04-27 DIAGNOSIS — K219 Gastro-esophageal reflux disease without esophagitis: Secondary | ICD-10-CM | POA: Insufficient documentation

## 2017-04-27 DIAGNOSIS — D128 Benign neoplasm of rectum: Secondary | ICD-10-CM | POA: Diagnosis not present

## 2017-04-27 HISTORY — PX: COLONOSCOPY WITH PROPOFOL: SHX5780

## 2017-04-27 HISTORY — DX: Gastro-esophageal reflux disease without esophagitis: K21.9

## 2017-04-27 SURGERY — COLONOSCOPY WITH PROPOFOL
Anesthesia: General

## 2017-04-27 MED ORDER — SODIUM CHLORIDE 0.9 % IV SOLN
INTRAVENOUS | Status: DC
  Administered 2017-04-27: 11:00:00 via INTRAVENOUS
  Administered 2017-04-27: 1000 mL via INTRAVENOUS

## 2017-04-27 MED ORDER — PROPOFOL 500 MG/50ML IV EMUL
INTRAVENOUS | Status: AC
  Filled 2017-04-27: qty 50

## 2017-04-27 MED ORDER — LIDOCAINE HCL (CARDIAC) 20 MG/ML IV SOLN
INTRAVENOUS | Status: DC | PRN
  Administered 2017-04-27: 50 mg via INTRAVENOUS

## 2017-04-27 MED ORDER — PROPOFOL 500 MG/50ML IV EMUL
INTRAVENOUS | Status: DC | PRN
  Administered 2017-04-27: 150 ug/kg/min via INTRAVENOUS

## 2017-04-27 MED ORDER — PROPOFOL 10 MG/ML IV BOLUS
INTRAVENOUS | Status: DC | PRN
  Administered 2017-04-27: 100 mg via INTRAVENOUS
  Administered 2017-04-27: 50 mg via INTRAVENOUS

## 2017-04-27 NOTE — Transfer of Care (Signed)
Immediate Anesthesia Transfer of Care Note  Patient: Tanner Frye  Procedure(s) Performed: Procedure(s): COLONOSCOPY WITH PROPOFOL (N/A)  Patient Location: PACU  Anesthesia Type:General  Level of Consciousness: sedated and responds to stimulation  Airway & Oxygen Therapy: Patient Spontanous Breathing and Patient connected to nasal cannula oxygen  Post-op Assessment: Post -op Vital signs reviewed and stable  Post vital signs: Reviewed and stable  Last Vitals:  Vitals:   04/27/17 0954 04/27/17 1133  BP: 115/85 113/71  Pulse: 65   Resp: 18 12  Temp: (!) 36.2 C     Last Pain: There were no vitals filed for this visit.       Complications: No apparent anesthesia complications

## 2017-04-27 NOTE — H&P (Signed)
  Jonathon Bellows MD [address] [place], [place] [address] Phone: [phone] Fax : [phone]  Primary Care Physician:  Coral Spikes, DO Primary Gastroenterologist:  Dr. Jonathon Bellows   Pre-Procedure History & Physical: HPI:  JUNG YURCHAK is a 41 y.o. male is here for an colonoscopy.   Past Medical History:  Diagnosis Date  . Anal fissure   . Elevated BP without diagnosis of hypertension   . GERD (gastroesophageal reflux disease)     Past Surgical History:  Procedure Laterality Date  . HERNIA REPAIR      Prior to Admission medications   Medication Sig Start Date End Date Taking? Authorizing Provider  Multiple Vitamin (MULTIVITAMIN) tablet Take 1 tablet by mouth daily.    [provider]  Omega-3 Fatty Acids (FISH OIL PO) Take by mouth.    [provider]  omeprazole (PRILOSEC) 20 MG capsule Take 20 mg by mouth daily.    [provider]    Allergies as of 03/29/2017 - Review Complete 03/29/2017  Allergen Reaction Noted  . Bee venom  06/19/2014  . Prednisone  06/19/2014    Family History  Problem Relation Age of Onset  . Hypertension Mother   . Hyperlipidemia Mother     Social History   Social History  . Marital status: Married    Spouse name: N/A  . Number of children: N/A  . Years of education: N/A   Occupational History  . Not on file.   Social History Main Topics  . Smoking status: Never Smoker  . Smokeless tobacco: Never Used  . Alcohol use Yes     Comment: social  . Drug use: No  . Sexual activity: Yes    Partners: Female   Other Topics Concern  . Not on file   Social History Narrative  . No narrative on file    Review of Systems: See HPI, otherwise negative ROS  Physical Exam: BP 115/85   Pulse 65   Temp (!) 97.2 F (36.2 C)   Resp 18   Ht 6\' 4"  (1.93 m)   Wt 260 lb (117.9 kg)   SpO2 99%   BMI 31.65 kg/m  General:   Alert,  pleasant and cooperative in NAD Head:  Normocephalic and  atraumatic. Neck:  Supple; no masses or thyromegaly. Lungs:  Clear throughout to auscultation.    Heart:  Regular rate and rhythm. Abdomen:  Soft, nontender and nondistended. Normal bowel sounds, without guarding, and without rebound.   Neurologic:  Alert and  oriented x4;  grossly normal neurologically.  Impression/Plan: [name] is here for an colonoscopy to be performed for rectal bleeding   Risks, benefits, limitations, and alternatives regarding  colonoscopy have been reviewed with the patient.  Questions have been answered.  All parties agreeable.   Jonathon Bellows, MD  04/27/2017, 10:07 AM

## 2017-04-27 NOTE — Anesthesia Preprocedure Evaluation (Signed)
Anesthesia Evaluation  Patient identified by MRN, date of birth, ID band Patient awake    Reviewed: Allergy & Precautions, NPO status , Patient's Chart, lab work & pertinent test results  History of Anesthesia Complications Negative for: history of anesthetic complications  Airway Mallampati: I  TM Distance: >3 FB Neck ROM: Full    Dental no notable dental hx.    Pulmonary neg pulmonary ROS, neg sleep apnea, neg COPD,    breath sounds clear to auscultation- rhonchi (-) wheezing      Cardiovascular Exercise Tolerance: Good (-) hypertension(-) CAD and (-) Past MI  Rhythm:Regular Rate:Normal - Systolic murmurs and - Diastolic murmurs    Neuro/Psych negative psych ROS   GI/Hepatic Neg liver ROS, GERD  ,  Endo/Other  negative endocrine ROSneg diabetes  Renal/GU negative Renal ROS     Musculoskeletal negative musculoskeletal ROS (+)   Abdominal (+) + obese,   Peds  Hematology negative hematology ROS (+)   Anesthesia Other Findings Past Medical History: No date: Anal fissure No date: Elevated BP without diagnosis of hypertension No date: GERD (gastroesophageal reflux disease)   Reproductive/Obstetrics                             Anesthesia Physical Anesthesia Plan  ASA: II  Anesthesia Plan: General   Post-op Pain Management:    Induction: Intravenous  PONV Risk Score and Plan: 1 and Propofol  Airway Management Planned: Natural Airway  Additional Equipment:   Intra-op Plan:   Post-operative Plan:   Informed Consent: I have reviewed the patients History and Physical, chart, labs and discussed the procedure including the risks, benefits and alternatives for the proposed anesthesia with the patient or authorized representative who has indicated his/her understanding and acceptance.   Dental advisory given  Plan Discussed with: CRNA and Anesthesiologist  Anesthesia Plan Comments:          Anesthesia Quick Evaluation

## 2017-04-27 NOTE — Anesthesia Procedure Notes (Signed)
Performed by: Michaelyn Wall Pre-anesthesia Checklist: Patient identified, Emergency Drugs available, Suction available, Patient being monitored and Timeout performed Patient Re-evaluated:Patient Re-evaluated prior to inductionOxygen Delivery Method: Nasal cannula Preoxygenation: Pre-oxygenation with 100% oxygen Intubation Type: IV induction       

## 2017-04-27 NOTE — Op Note (Signed)
[facility] Gastroenterology Patient Name: Tanner Frye Procedure Date: 04/27/2017 11:03 AM MRN: [id number] Account #: [id number] Date of Birth: Dec 21, 1975 Admit Type: Outpatient Age: 41 Room: [facility] ENDO ROOM 1 Gender: Male Note Status: Finalized Procedure:            Colonoscopy Indications:          Rectal bleeding Providers:            Jonathon Bellows MD, MD Referring MD:         Barnie Del. Lacinda Axon MD, MD (Referring MD) Medicines:            Monitored Anesthesia Care Complications:        No immediate complications. Procedure:            Pre-Anesthesia Assessment:                       - Prior to the procedure, a History and Physical was                        performed, and patient medications, allergies and                        sensitivities were reviewed. The patient's tolerance of                        previous anesthesia was reviewed.                       - The risks and benefits of the procedure and the                        sedation options and risks were discussed with the                        patient. All questions were answered and informed                        consent was obtained.                       - ASA Grade Assessment: II - A patient with mild                        systemic disease.                       After obtaining informed consent, the colonoscope was                        passed under direct vision. Throughout the procedure,                        the patient's blood pressure, pulse, and oxygen                        saturations were monitored continuously. The                        Colonoscope was introduced through the anus and  advanced to the the cecum, identified by the                        appendiceal orifice, IC valve and transillumination.                        The colonoscopy was performed with ease. The patient                        tolerated the procedure well. The quality of the bowel               preparation was good. Findings:      Non-bleeding internal hemorrhoids were found during retroflexion. The       hemorrhoids were small and Grade I (internal hemorrhoids that do not       prolapse).      A 7 mm polyp was found in the rectum. The polyp was sessile. The polyp       was removed with a cold snare. Resection and retrieval were complete.      The exam was otherwise without abnormality on direct and retroflexion       views. Impression:           - Non-bleeding internal hemorrhoids.                       - One 7 mm polyp in the rectum, removed with a cold                        snare. Resected and retrieved.                       - The examination was otherwise normal on direct and                        retroflexion views. Recommendation:       - Discharge patient to home (with escort).                       - Resume previous diet.                       - Continue present medications.                       - Await pathology results.                       - Repeat colonoscopy for surveillance based on                        pathology results. Procedure Code(s):    --- Professional ---                       [id number], Colonoscopy, flexible; with removal of tumor(s),                        polyp(s), or other lesion(s) by snare technique Diagnosis Code(s):    --- Professional ---                       K62.1, Rectal polyp  K64.0, First degree hemorrhoids                       K62.5, Hemorrhage of anus and rectum CPT copyright 2016 American Medical Association. All rights reserved. The codes documented in this report are preliminary and upon coder review may  be revised to meet current compliance requirements. Jonathon Bellows, MD Jonathon Bellows MD, MD 04/27/2017 11:26:53 AM This report has been signed electronically. Number of Addenda: 0 Note Initiated On: 04/27/2017 11:03 AM Scope Withdrawal Time: 0 hours 9 minutes 14 seconds  Total Procedure Duration: 0  hours 13 minutes 1 second       [facility]

## 2017-04-27 NOTE — Anesthesia Postprocedure Evaluation (Signed)
Anesthesia Post Note  Patient: Tanner Frye  Procedure(s) Performed: Procedure(s) (LRB): COLONOSCOPY WITH PROPOFOL (N/A)  Patient location during evaluation: Endoscopy Anesthesia Type: General Level of consciousness: awake and alert and oriented Pain management: pain level controlled Vital Signs Assessment: post-procedure vital signs reviewed and stable Respiratory status: spontaneous breathing, nonlabored ventilation and respiratory function stable Cardiovascular status: blood pressure returned to baseline and stable Postop Assessment: no signs of nausea or vomiting Anesthetic complications: no     Last Vitals:  Vitals:   04/27/17 1152 04/27/17 1202  BP: (!) 123/93 109/73  Pulse: 62 61  Resp: 14 17  Temp:      Last Pain:  Vitals:   04/27/17 1132  TempSrc: Tympanic                 Carl Butner

## 2017-04-27 NOTE — Anesthesia Post-op Follow-up Note (Cosign Needed)
Anesthesia QCDR form completed.        

## 2017-04-28 ENCOUNTER — Encounter: Payer: Self-pay | Admitting: Gastroenterology

## 2017-04-28 ENCOUNTER — Ambulatory Visit: Payer: 59 | Admitting: Gastroenterology

## 2017-04-28 LAB — SURGICAL PATHOLOGY

## 2017-05-02 ENCOUNTER — Encounter: Payer: Self-pay | Admitting: Gastroenterology

## 2017-06-11 ENCOUNTER — Ambulatory Visit (INDEPENDENT_AMBULATORY_CARE_PROVIDER_SITE_OTHER): Payer: 59 | Admitting: Sports Medicine

## 2017-06-11 VITALS — BP 132/90 | Ht 76.0 in | Wt 260.0 lb

## 2017-06-11 DIAGNOSIS — R269 Unspecified abnormalities of gait and mobility: Secondary | ICD-10-CM

## 2017-06-11 DIAGNOSIS — M2141 Flat foot [pes planus] (acquired), right foot: Secondary | ICD-10-CM | POA: Diagnosis not present

## 2017-06-11 DIAGNOSIS — M76821 Posterior tibial tendinitis, right leg: Secondary | ICD-10-CM | POA: Diagnosis not present

## 2017-06-11 DIAGNOSIS — M2140 Flat foot [pes planus] (acquired), unspecified foot: Secondary | ICD-10-CM | POA: Insufficient documentation

## 2017-06-11 DIAGNOSIS — M2142 Flat foot [pes planus] (acquired), left foot: Secondary | ICD-10-CM

## 2017-06-11 DIAGNOSIS — M76822 Posterior tibial tendinitis, left leg: Secondary | ICD-10-CM | POA: Diagnosis not present

## 2017-06-11 DIAGNOSIS — M25579 Pain in unspecified ankle and joints of unspecified foot: Secondary | ICD-10-CM | POA: Diagnosis not present

## 2017-06-11 MED ORDER — NAPROXEN 500 MG PO TABS: ORAL_TABLET | ORAL | 1 refills | Status: DC

## 2017-06-11 NOTE — Progress Notes (Signed)
HPI  CC: Bilateral foot pain (L>R) Patient is here with chronic bilateral foot pain. He states that he has been dealing with this issue over the past 3 years. Patient has seen numerous providers in the past and has not had much relief with various interventions. He states that pain is located distal to the medial malleolus and travels downward to the plantar surface of the foot. He also has significant tenderness of the distal metatarsals (2 and 3). Pain is sharp and aching in nature. It seems to be getting worse. Pain is constant. Most interventions have not provided much relief. Pain is NOT worse first thing in the morning.  He denies any history of injury, trauma, or fall. No weakness, numbness, or paresthesias. No significant swelling, erythema, or ecchymosis.  Traumatic: No (Yes? MOI)  Location: Distal to the medial malleolus and lateral malleolus bilaterally, plantar foot near the distal head of the metatarsals. Quality: Sharp and aching  Duration: 3 years  Timing: Seems to be worse at the end of the day, especially if he has been on his feet.  Improving/Worsening: Stable/worse  Makes better: Rest  Makes worse: Prolonged activity  Associated symptoms: None   Previous Interventions Tried: Orthotics, hydrotherapy, plantar fasciitis stretches.   Past Injuries: No  Past Surgeries: no  Smoking: no  Family Hx: Noncontributory   ROS: Per HPI; in addition no fever, no rash, no additional weakness, no additional numbness, no additional paresthesias, and no additional falls/injury.   Objective: BP 132/90   Ht 6\' 4"  (1.93 m)   Wt 260 lb (117.9 kg)   BMI 31.65 kg/m  Gen: NAD, well groomed, a/o x3, normal affect.  CV: Well-perfused. Warm.  Resp: Non-labored.  Neuro: Sensation intact throughout. No gross coordination deficits.  Gait: Nonpathologic posture, unremarkable stride without signs of limp or balance issues. Tibiotalar "wobble" noted bilaterally (L>R) Ankle/Foot, bilateral:  TTP noted at the soft tissue just distal to the medial malleolus and lateral malleolus tracking posteriorly and superiorly into the deep calf. TTP also on the plantar surface of the foot near the distal metatarsal heads. No visible erythema, swelling, ecchymosis, or bony deformity. Notable pes planus deformity when standing. Transverse arch grossly intact; Minimal evidence of left-sided tibiotalar deviation; left-sided hallux valgus deviation. Bilateral forefoot abduction. Range of motion is full in all directions. Strength is 5/5 in all directions. No tenderness at the insertion/body/myotendinous junction of the Achilles tendon; TTP at the peroneal tendons; No tenderness on posterior aspects of lateral and medial malleolus; Stable lateral and medial ligaments; Unremarkable squeeze and kleiger tests; Talar dome nontender; Unremarkable calcaneal squeeze; No plantar calcaneal tenderness; No tenderness over the navicular prominence; No tenderness over cuboid; No pain at base of 5th MT; heel raise elicited pain but appropriate calcaneal inversion demonstrated. Able to walk 4 steps.    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 Findings consistent with bilateral posterior tibialis and peroneal tendon tendinitis. Exam yielded signs of pes planus and forefoot abduction. Gait showed a tibiotalar wobble bilaterally. Many findings seem to indicate risk for abnormal peroneal and posterior tibialis dependence and use. These findings are consistent with MRI obtained in April. - Shoe inserts provided today. Patient advised to use these whenever he is anticipating being mobile for 15 minutes or more. - Naproxen 500 mg twice a day 5 days then twice a day when necessary after that - Rest and ice as needed -  Referral to physical therapy placed >> instruction for posterior tibialis muscle strengthening requested. - Patient to  follow-up in 4 weeks  Next: if patient has some improvement with this treatment plan strong consideration for custom orthotic   Orders Placed This Encounter  Procedures  . Ambulatory referral to Physical Therapy    Referral Priority:   Routine    Referral Type:   Physical Medicine    Referral Reason:   Specialty Services Required    Requested Specialty:   Physical Therapy    Number of Visits Requested:   1    Meds ordered this encounter  Medications  . naproxen (NAPROSYN) 500 MG tablet    Sig: Take 1 tablet 2 times a day for 5 days. Then take 1 tablet 2 times a day AS NEEDED after that.    Dispense:  60 tablet    Refill:  [place], MD,MS [facility] Fellow 06/11/2017 5:27 PM

## 2017-06-11 NOTE — Patient Instructions (Addendum)
It was a pleasure seeing you today in our clinic. Today we discussed your bilateral foot pain. Here is the treatment plan we have discussed and agreed upon together:  1. Posterior Tibialis Tendonitis 2. Peroneal Tendonitis   - We have provided you shoe inserts today. Where these daily for any activities requiring need to be on your feet for longer than 15 minutes. - I prescribed you naproxen. Take 1 tablet twice a day every day for 5 days. Then take 1 tablet twice a day AS NEEDED after that. - We've placed a referral for physical therapy. He will be contacted to make an appointment. - We would like to see you back in 1 month to assess any improvement.   [facility] at [facility] [place], [place], [place] [address] Phone: [phone]

## 2017-06-11 NOTE — Assessment & Plan Note (Signed)
Patient is here with a long history of bilateral foot pain. Findings consistent with bilateral posterior tibialis and peroneal tendon tendinitis. Exam yielded signs of pes planus and forefoot abduction. Gait showed a tibiotalar wobble bilaterally. Many findings seem to indicate risk for abnormal peroneal and posterior tibialis dependence and use. These findings are consistent with MRI obtained in April. - Shoe inserts provided today. Patient advised to use these whenever Tanner Frye is anticipating being mobile for 15 minutes or more. - Naproxen 500 mg twice a day 5 days then twice a day when necessary after that - Rest and ice as needed - Referral to physical therapy placed >> instruction for posterior tibialis muscle strengthening requested. - Patient to follow-up in 4 weeks  Next: if patient has some improvement with this treatment plan strong consideration for custom orthotic

## 2017-06-17 ENCOUNTER — Ambulatory Visit: Payer: 59 | Attending: Sports Medicine | Admitting: Physical Therapy

## 2017-06-17 DIAGNOSIS — M25675 Stiffness of left foot, not elsewhere classified: Secondary | ICD-10-CM | POA: Diagnosis not present

## 2017-06-17 DIAGNOSIS — M79672 Pain in left foot: Secondary | ICD-10-CM | POA: Insufficient documentation

## 2017-06-17 DIAGNOSIS — M79671 Pain in right foot: Secondary | ICD-10-CM | POA: Diagnosis not present

## 2017-06-17 DIAGNOSIS — M25674 Stiffness of right foot, not elsewhere classified: Secondary | ICD-10-CM | POA: Insufficient documentation

## 2017-06-17 DIAGNOSIS — R269 Unspecified abnormalities of gait and mobility: Secondary | ICD-10-CM | POA: Insufficient documentation

## 2017-06-18 ENCOUNTER — Encounter: Payer: Self-pay | Admitting: Physical Therapy

## 2017-06-18 NOTE — Therapy (Addendum)
[facility] [address] [place], [place], [address] Phone: [phone]   Fax:  [phone]  Physical Therapy Evaluation  Patient Details  Name: Tanner Frye MRN: [id number] Date of Birth: [birth year] Referring Provider: Dr. Micheline Chapman  Encounter Date: 06/17/2017   T End of Session - 06/18/17 1657    Visit Number 1   Number of Visits 8   Date for PT Re-Evaluation 07/15/17   PT Start Time 7062   PT Stop Time 1445   PT Time Calculation (min) 63 min   Activity Tolerance Patient tolerated treatment well   Behavior During Therapy [facility] for tasks assessed/performed    Past Medical History:  Diagnosis Date  . Anal fissure   . Elevated BP without diagnosis of hypertension   . GERD (gastroesophageal reflux disease)     Past Surgical History:  Procedure Laterality Date  . COLONOSCOPY WITH PROPOFOL N/A 04/27/2017   Procedure: COLONOSCOPY WITH PROPOFOL;  Surgeon: Jonathon Bellows, MD;  Location: [facility] ENDOSCOPY;  Service: Endoscopy;  Laterality: N/A;  . HERNIA REPAIR      There were no vitals filed for this visit.     Pt. reports chronic B foot (ankle/plantar pain)- L worse than R.  Pt. has seen several MDs and has received imaging in past (see EPIC chart).        See HEP (stretching/ stability ex.).         Pt. is a pleasant 41 y/o male with chronic c/o B foot pain (posterior tibial tendonitis).  Pt. presents with custom orthoses in L shoe with cuboid pad (discomfort reported/ not used to wearing <1 week).  Significant B pes planus/ <3 mm. Navicular drop.  Poor moblity in navicular/ cuboid.  Good B great toe extension and toe off.  Poor fitting sneaker with gapping in medial aspect (> 2 finger width).  Pt. wears a size 16 shoe and has difficulty finding properly fitting shoes.  L/R ankle AROM: DF (13/12 deg.), PF (52/55 deg.), IV (38/36 deg.), EV (25/26 deg.)- pain.  Palpable pain over L>R posterior tibial tendon.  Pt. ambulates with  slight antalgic gait with significant pronation noted in B feet.  LEFS: 55 out of 80.  Pt. will benefit from skilled PT services to increae B ankle strength to improve pain-free mobility with walking/ work-related tasks.            PT Long Term Goals - 06/18/17 3762      PT LONG TERM GOAL #1   Title Pt. I with HEP to increase B ankle IV/EV to 5/5 MMT with no c/o foot pain to improve foot control with walking.    Baseline B ankle IV/EV 4/5 MMT (pain reported).    Time 4   Period Weeks   Status New   Target Date 07/15/17     PT LONG TERM GOAL #2   Title Pt. will increase LEFS to >65 out of 80 to improve pain-free mobility.     Baseline LEFS: 55 out of 80 on 8/30   Time 4   Period Weeks   Status New   Target Date 07/15/17     PT LONG TERM GOAL #3   Title Pt. able to demonstrate proper technique with doming on B plantar aspects of feet to increase arch mobility/ decrease pain.     Baseline limited arch/ poor control/ plantar muscle weakness noted.    Time 4   Period Weeks   Status New   Target  Date 07/15/17     PT LONG TERM GOAL #4   Title Pt. able to complete 30 minutes of standing/walking at work with no c/o B foot pain.     Baseline Increase B foot pain with prolonged standing/walking >5/10 (pt. states he has a high pain tolerance).    Time 4   Period Weeks   Status New   Target Date 07/15/17          Patient will benefit from skilled therapeutic intervention in order to improve the following deficits and impairments:  Pain, Decreased mobility, Decreased activity tolerance, Decreased range of motion, Decreased strength, Hypomobility, Difficulty walking  Visit Diagnosis: Bilateral foot pain  Joint stiffness of both feet  Gait difficulty     Problem List Patient Active Problem List   Diagnosis Date Noted  . 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 Pes planus 06/11/2017  . Abnormal gait 06/11/2017  . Influenza 12/22/2016  .  Elevated blood pressure 06/19/2014  . Family history of hyperlipidemia 06/19/2014   Pura Spice, PT, DPT # [phone] 06/18/2017, 6:13 AM  [place] [facility] [address] [place], [place], [address] Phone: [phone]   Fax:  [phone]  Name: Tanner Frye MRN: [id number] Date of Birth: 1975-10-29

## 2017-06-25 ENCOUNTER — Ambulatory Visit: Payer: 59 | Attending: Sports Medicine | Admitting: Physical Therapy

## 2017-06-25 DIAGNOSIS — M79672 Pain in left foot: Secondary | ICD-10-CM | POA: Diagnosis not present

## 2017-06-25 DIAGNOSIS — M25675 Stiffness of left foot, not elsewhere classified: Secondary | ICD-10-CM | POA: Insufficient documentation

## 2017-06-25 DIAGNOSIS — R269 Unspecified abnormalities of gait and mobility: Secondary | ICD-10-CM | POA: Diagnosis not present

## 2017-06-25 DIAGNOSIS — M25674 Stiffness of right foot, not elsewhere classified: Secondary | ICD-10-CM | POA: Diagnosis not present

## 2017-06-25 DIAGNOSIS — M79671 Pain in right foot: Secondary | ICD-10-CM | POA: Diagnosis not present

## 2017-06-26 NOTE — Therapy (Addendum)
[place] [facility] [address] [place], [place], [address] Phone: [phone]   Fax:  [phone]  Physical Therapy Treatment  Patient Details  Name: Tanner Frye MRN: [id number] Date of Birth: April 30, 1976 Referring Provider: Dr. Micheline Chapman  Encounter Date: 06/25/2017       PT End of Session - 06/26/17 1845    Visit Number 2   Number of Visits 8   Date for PT Re-Evaluation 07/15/17   PT Start Time 1110   PT Stop Time 1201   PT Time Calculation (min) 51 min   Activity Tolerance Patient tolerated treatment well;Patient limited by pain   Behavior During Therapy [facility] for tasks assessed/performed        Past Medical History:  Diagnosis Date  . Anal fissure   . Elevated BP without diagnosis of hypertension   . GERD (gastroesophageal reflux disease)     Past Surgical History:  Procedure Laterality Date  . COLONOSCOPY WITH PROPOFOL N/A 04/27/2017   Procedure: COLONOSCOPY WITH PROPOFOL;  Surgeon: Jonathon Bellows, MD;  Location: [facility] ENDOSCOPY;  Service: Endoscopy;  Laterality: N/A;  . HERNIA REPAIR      There were no vitals filed for this visit.    No new complaints.  Pt. reports compliance with HEP.  Pt. still not used to wearing orthorses with metatarsal pad.       There.ex.:  Reviewed HEP  Manual tx.: Supine B gastroc/ soleus stretching 3x 30 sec. Each.  Supine L/R TC AP mobs. 2x30 sec. Generalized B ankle PROM/ stretching in pain tolerable range.  Deep STM to B plantar aspects of feet with lotion.  Instructed in ice massage after tx.         Moderate B plantar fascia tenderness with STM.  Increase plantar foot tightness with great toe extension/ stretches.  Significant pes planus and limited ability to correct in standing/ wt. bearing tasks without shoes.  Primary focus on manual tx. in clinic today with focus on stability ex. at home.          PT Long Term Goals - 06/18/17 4081      PT LONG TERM GOAL #1   Title Pt. I  with HEP to increase B ankle IV/EV to 5/5 MMT with no c/o foot pain to improve foot control with walking.    Baseline B ankle IV/EV 4/5 MMT (pain reported).    Time 4   Period Weeks   Status New   Target Date 07/15/17     PT LONG TERM GOAL #2   Title Pt. will increase LEFS to >65 out of 80 to improve pain-free mobility.     Baseline LEFS: 55 out of 80 on 8/30   Time 4   Period Weeks   Status New   Target Date 07/15/17     PT LONG TERM GOAL #3   Title Pt. able to demonstrate proper technique with doming on B plantar aspects of feet to increase arch mobility/ decrease pain.     Baseline limited arch/ poor control/ plantar muscle weakness noted.    Time 4   Period Weeks   Status New   Target Date 07/15/17     PT LONG TERM GOAL #4   Title Pt. able to complete 30 minutes of standing/walking at work with no c/o B foot pain.     Baseline Increase B foot pain with prolonged standing/walking >5/10 (pt. states he has a high pain tolerance).    Time 4  Period Weeks   Status New   Target Date 07/15/17             Patient will benefit from skilled therapeutic intervention in order to improve the following deficits and impairments:  Pain, Decreased mobility, Decreased activity tolerance, Decreased range of motion, Decreased strength, Hypomobility, Difficulty walking  Visit Diagnosis: Bilateral foot pain  Joint stiffness of both feet  Gait difficulty     Problem List Patient Active Problem List   Diagnosis Date Noted  . 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 Pes planus 06/11/2017  . Abnormal gait 06/11/2017  . Influenza 12/22/2016  . Elevated blood pressure 06/19/2014  . Family history of hyperlipidemia 06/19/2014   Pura Spice, PT, DPT # [phone] 06/26/2017, 2:39 PM  [facility] [address] [place], [place], [address] Phone: [phone]   Fax:  [phone]  Name: Tanner Frye MRN:  [id number] Date of Birth: 12-23-1975

## 2017-07-07 ENCOUNTER — Ambulatory Visit: Payer: 59 | Admitting: Physical Therapy

## 2017-07-07 DIAGNOSIS — M25675 Stiffness of left foot, not elsewhere classified: Secondary | ICD-10-CM | POA: Diagnosis not present

## 2017-07-07 DIAGNOSIS — R269 Unspecified abnormalities of gait and mobility: Secondary | ICD-10-CM | POA: Diagnosis not present

## 2017-07-07 DIAGNOSIS — M79671 Pain in right foot: Secondary | ICD-10-CM

## 2017-07-07 DIAGNOSIS — M25674 Stiffness of right foot, not elsewhere classified: Secondary | ICD-10-CM

## 2017-07-07 DIAGNOSIS — M79672 Pain in left foot: Secondary | ICD-10-CM | POA: Diagnosis not present

## 2017-07-08 ENCOUNTER — Ambulatory Visit (INDEPENDENT_AMBULATORY_CARE_PROVIDER_SITE_OTHER): Payer: 59 | Admitting: Sports Medicine

## 2017-07-08 VITALS — BP 144/100 | Ht 76.0 in | Wt 260.0 lb

## 2017-07-08 DIAGNOSIS — M76821 Posterior tibial tendinitis, right leg: Secondary | ICD-10-CM

## 2017-07-08 DIAGNOSIS — M76822 Posterior tibial tendinitis, left leg: Secondary | ICD-10-CM

## 2017-07-08 DIAGNOSIS — M25572 Pain in left ankle and joints of left foot: Secondary | ICD-10-CM | POA: Diagnosis not present

## 2017-07-08 NOTE — Progress Notes (Signed)
   Subjective:    Patient ID: Tanner Frye, male    DOB: Feb 03, 1976, 41 y.o.   MRN: [id number]  HPI   Fitzroy comes in today for follow-up on chronic bilateral foot pain. He thinks that the green sports insoles are helpful but he's not quite sure. He still having some pain in the second metatarsal head of his left foot. He's had 3 visits with physical therapy. He's not sure if that has been helpful or not. He is here today to consider custom orthotics.   Review of Systems As above    Objective:   Physical Exam  Well-developed, well-nourished. No acute distress. Sitting In exam room.  Examination of both feet shows noticeable pes planus bilaterally. Pronation with walking. He also has some slight tenderness to palpation along the anterior lateral ankle. No effusion. No soft tissue swelling. He is tender to palpation at the second metatarsal head on the left. Negative metatarsal squeeze. Neurovascularly intact distally. Walking without a significant limp.      Assessment & Plan:   Chronic bilateral foot pain secondary to posterior tibialis tendon dysfunction Metatarsalgia, left foot  I do not think the green sports insoles are providing enough arch support. He is a rather large gentleman with a very big foot. I think we should try custom orthotics. He is in agreement with this. Custom orthotics were constructed today. He can discontinue physical therapy if he does not find it to be helpful. He understands that the orthotics may help some but they may not be curative. Previous MRI of his foot did not show any operative pathology. I would like to get an x-ray of his left ankle just to rule out early OA. I'll call him with those results when available. I would like to see him back in the office in 4 weeks for reevaluation.  Total time spent with the patient was 30 minutes with greater than 50% of the time spent in face-to-face consultation discussing orthotic construction, instruction, and  fitting. Gait neutral with orthotics in place.  Patient was fitted for a : standard, cushioned, semi-rigid orthotic. The orthotic was heated and afterward the patient stood on the orthotic blank positioned on the orthotic stand. The patient was positioned in subtalar neutral position and 10 degrees of ankle dorsiflexion in a weight bearing stance. After completion of molding, a stable base was applied to the orthotic blank. The blank was ground to a stable position for weight bearing. Size: 16 Base: Blue EVA Posting: none Additional orthotic padding: metatarsal pad on the left

## 2017-07-11 NOTE — Therapy (Addendum)
[facility] [address] [place], [place], [address] Phone: [phone]   Fax:  [phone]  Physical Therapy Treatment  Patient Details  Name: Tanner Frye MRN: [id number] Date of Birth: 1976/03/20 Referring Provider: Dr. Micheline Chapman  Encounter Date: 07/07/2017    PT End of Session - 07/11/17 2036    Visit Number 3   Number of Visits 8   Date for PT Re-Evaluation 07/15/17   PT Start Time 1884   PT Stop Time 1511   PT Time Calculation (min) 42 min   Activity Tolerance Patient tolerated treatment well;Patient limited by pain   Behavior During Therapy [facility] for tasks assessed/performed        Past Medical History:  Diagnosis Date  . Anal fissure   . Elevated BP without diagnosis of hypertension   . GERD (gastroesophageal reflux disease)     Past Surgical History:  Procedure Laterality Date  . COLONOSCOPY WITH PROPOFOL N/A 04/27/2017   Procedure: COLONOSCOPY WITH PROPOFOL;  Surgeon: Jonathon Bellows, MD;  Location: [facility] ENDOSCOPY;  Service: Endoscopy;  Laterality: N/A;  . HERNIA REPAIR      There were no vitals filed for this visit.    Pt. states he is still limited with B foot pain L>R with prolonged standing/ walking.  Pt. states wife is massaging his feet at home.  Pt. returns to MD tomorrow.     Manual tx.: Supine B gastroc/ soleus stretching 3x 30 sec. Each.  Supine B ankle manual isometrics with max resistance 5x10 sec. Each (focus on IV/EV in pain tolerable range). Supine L/R TC AP mobs. 2x30 sec. Generalized B ankle PROM/ stretching in pain tolerable range.  Deep STM to B plantar aspects of feet with lotion.  Instructed in ice massage after tx.    Limited navicular mobility.       Moderate L>R ankle instability with higher level BOSU/ Airex/ heel raise ex.  Significant pes planus without shoes and increase strain/ tension on posterior tibia tendon. (+) Medial ankle tenderness along medial malleoli.  Pt. will benefit  from more supportive sneakers and continued PT.  Discuss MD appt. next tx. session.              PT Long Term Goals - 06/18/17 1660      PT LONG TERM GOAL #1   Title Pt. I with HEP to increase B ankle IV/EV to 5/5 MMT with no c/o foot pain to improve foot control with walking.    Baseline B ankle IV/EV 4/5 MMT (pain reported).    Time 4   Period Weeks   Status New   Target Date 07/15/17     PT LONG TERM GOAL #2   Title Pt. will increase LEFS to >65 out of 80 to improve pain-free mobility.     Baseline LEFS: 55 out of 80 on 8/30   Time 4   Period Weeks   Status New   Target Date 07/15/17     PT LONG TERM GOAL #3   Title Pt. able to demonstrate proper technique with doming on B plantar aspects of feet to increase arch mobility/ decrease pain.     Baseline limited arch/ poor control/ plantar muscle weakness noted.    Time 4   Period Weeks   Status New   Target Date 07/15/17     PT LONG TERM GOAL #4   Title Pt. able to complete 30 minutes of standing/walking at work with no c/o B  foot pain.     Baseline Increase B foot pain with prolonged standing/walking >5/10 (pt. states he has a high pain tolerance).    Time 4   Period Weeks   Status New   Target Date 07/15/17             Patient will benefit from skilled therapeutic intervention in order to improve the following deficits and impairments:  Pain, Decreased mobility, Decreased activity tolerance, Decreased range of motion, Decreased strength, Hypomobility, Difficulty walking  Visit Diagnosis: Bilateral foot pain  Joint stiffness of both feet  Gait difficulty     Problem List Patient Active Problem List   Diagnosis Date Noted  . 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 Pes planus 06/11/2017  . Abnormal gait 06/11/2017  . Influenza 12/22/2016  . Elevated blood pressure 06/19/2014  . Family history of hyperlipidemia 06/19/2014   Pura Spice, PT, DPT #  [phone] 07/11/2017, 2:50 PM  [place] [facility] [address] [place], [place], [address] Phone: [phone]   Fax:  [phone]  Name: Tanner Frye MRN: [id number] Date of Birth: 09-Nov-1975

## 2017-07-23 ENCOUNTER — Ambulatory Visit: Payer: 59 | Attending: Sports Medicine | Admitting: Physical Therapy

## 2017-07-23 DIAGNOSIS — M79671 Pain in right foot: Secondary | ICD-10-CM | POA: Diagnosis not present

## 2017-07-23 DIAGNOSIS — M79672 Pain in left foot: Secondary | ICD-10-CM | POA: Insufficient documentation

## 2017-07-23 DIAGNOSIS — M25674 Stiffness of right foot, not elsewhere classified: Secondary | ICD-10-CM | POA: Insufficient documentation

## 2017-07-23 DIAGNOSIS — R269 Unspecified abnormalities of gait and mobility: Secondary | ICD-10-CM | POA: Diagnosis not present

## 2017-07-23 DIAGNOSIS — M25675 Stiffness of left foot, not elsewhere classified: Secondary | ICD-10-CM | POA: Insufficient documentation

## 2017-07-29 ENCOUNTER — Ambulatory Visit: Payer: 59 | Admitting: Physical Therapy

## 2017-07-29 DIAGNOSIS — R269 Unspecified abnormalities of gait and mobility: Secondary | ICD-10-CM | POA: Diagnosis not present

## 2017-07-29 DIAGNOSIS — M79671 Pain in right foot: Secondary | ICD-10-CM | POA: Diagnosis not present

## 2017-07-29 DIAGNOSIS — M25674 Stiffness of right foot, not elsewhere classified: Secondary | ICD-10-CM | POA: Diagnosis not present

## 2017-07-29 DIAGNOSIS — M25675 Stiffness of left foot, not elsewhere classified: Secondary | ICD-10-CM | POA: Diagnosis not present

## 2017-07-29 DIAGNOSIS — M79672 Pain in left foot: Principal | ICD-10-CM

## 2017-07-30 NOTE — Therapy (Addendum)
[place] [facility] [address] [place], [place], [address] Phone: [phone]   Fax:  [phone]  Physical Therapy Treatment  Patient Details  Name: Tanner Frye MRN: [id number] Date of Birth: 1976/07/29 Referring Provider: Dr. Micheline Chapman  Encounter Date: 07/23/2017  Treatment 4 of 8.  Recert: 13/2/44    Past Medical History:  Diagnosis Date  . Anal fissure   . Elevated BP without diagnosis of hypertension   . GERD (gastroesophageal reflux disease)     Past Surgical History:  Procedure Laterality Date  . COLONOSCOPY WITH PROPOFOL N/A 04/27/2017   Procedure: COLONOSCOPY WITH PROPOFOL;  Surgeon: Jonathon Bellows, MD;  Location: [facility] ENDOSCOPY;  Service: Endoscopy;  Laterality: N/A;  . HERNIA REPAIR      There were no vitals filed for this visit.    Pt. has returned to MD with no significant changes to tx.  Pt. received a modified orthorises in L shoe.  Pt. given option from MD to continue with PT if he feels it is helping.  Pt. states he wants to focus on PT/ strengthening at this time but he is not interested in surgery.         See new HEP/ handouts.  BOSU ankle stability ex.  Manual tx.:  Supine L/R TC AP mobs. 2x30 sec.  Static gastroc/ soleus stretches with slight IV or EV in pain tolerable range.  STM to medial ankle joint/ plantar aspects of foot.  Prone cuboid/ navicular mobs.  Ice to B arch in seated posture with water bottle 8 min.         Pt. has returned to focus on skilled PT services to avoid any invasive/ surgical interventions to feet/ ankle.  Pt. states he hasn't been too compliant with HEP secondary to busy.  PT issued focused ex. program and edcuated on importance of foot posture/ shoe wear/ arch support.  See updated goals.  Returns to MD in 4-6 weeks.           PT Long Term Goals - 07/24/17 1502      PT LONG TERM GOAL #1   Title Pt. I with HEP to increase B ankle IV/EV to 5/5 MMT with no c/o foot pain to  improve foot control with walking.    Baseline B ankle IV/EV 4+/5 MMT (pain reported).    Time 4   Period Weeks   Status Partially Met   Target Date 08/20/17     PT LONG TERM GOAL #2   Title Pt. will increase LEFS to >65 out of 80 to improve pain-free mobility.     Baseline LEFS: 55 out of 80 on 8/30   Time 4   Period Weeks   Status On-going   Target Date 08/20/17     PT LONG TERM GOAL #3   Title Pt. able to demonstrate proper technique with doming on B plantar aspects of feet to increase arch mobility/ decrease pain.     Baseline limited arch/ poor control/ plantar muscle weakness noted.    Time 4   Period Weeks   Status Not Met   Target Date 08/20/17     PT LONG TERM GOAL #4   Title Pt. able to complete 30 minutes of standing/walking at work with no c/o B foot pain.     Baseline Increase B foot pain with prolonged standing/walking >3/10     Time 4   Period Weeks   Status Not Met   Target Date 08/20/17  Patient will benefit from skilled therapeutic intervention in order to improve the following deficits and impairments:  Pain, Decreased mobility, Decreased activity tolerance, Decreased range of motion, Decreased strength, Hypomobility, Difficulty walking  Visit Diagnosis: Bilateral foot pain  Joint stiffness of both feet  Gait difficulty     Problem List Patient Active Problem List   Diagnosis Date Noted  . 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 Pes planus 06/11/2017  . Abnormal gait 06/11/2017  . Influenza 12/22/2016  . Elevated blood pressure 06/19/2014  . Family history of hyperlipidemia 06/19/2014   Pura Spice, PT, DPT # [phone] 07/30/2017, 3:04 PM  [facility] [address] [place], [place], [address] Phone: [phone]   Fax:  [phone]  Name: Tanner Frye MRN: [id number] Date of Birth: 24-Mar-1976

## 2017-07-30 NOTE — Therapy (Addendum)
[place] [facility] [address] [place], [place], [address] Phone: [phone]   Fax:  [phone]  Physical Therapy Treatment  Patient Details  Name: SABIEN UMLAND MRN: [id number] Date of Birth: 1976/04/25 Referring Provider: Dr. Micheline Chapman  Encounter Date: 07/29/2017    Past Medical History:  Diagnosis Date  . Anal fissure   . Elevated BP without diagnosis of hypertension   . GERD (gastroesophageal reflux disease)     Past Surgical History:  Procedure Laterality Date  . COLONOSCOPY WITH PROPOFOL N/A 04/27/2017   Procedure: COLONOSCOPY WITH PROPOFOL;  Surgeon: Jonathon Bellows, MD;  Location: [facility] ENDOSCOPY;  Service: Endoscopy;  Laterality: N/A;  . HERNIA REPAIR      There were no vitals filed for this visit.    Pt. reports no new complaints.  Doing HEP.        Manual tx.:  Supine L/R TC AP mobs. 2x30 sec.  Static gastroc/ soleus stretches with slight IV or EV in pain tolerable range.  STM to medial ankle joint/ plantar aspects of foot.  Prone cuboid/ navicular mobs.  Deep STM to B plantar aspects of foot with occasional great toe ext.  Therex.: isometrics (max. Resistance)- 5x each plane.  BOSU step ups/ static standing/ marching.  Balance disc standing/ SLS (UE assist in //-bars).  Standing prostretch/ stair stretch.         Good B ankle strength noted with isometrics today with no increase c/o pain (2/10 L medial ankle discomfort).  Stability issues noted on balance disc/ BOSU with shoes on.  Pt. progressing with stability HEP and continues to benefit from increasing foot posture/ arch to decrease post. tibial pain.            PT Long Term Goals - 07/24/17 1502      PT LONG TERM GOAL #1   Title Pt. I with HEP to increase B ankle IV/EV to 5/5 MMT with no c/o foot pain to improve foot control with walking.    Baseline B ankle IV/EV 4+/5 MMT (pain reported).    Time 4   Period Weeks   Status Partially Met   Target Date  08/20/17     PT LONG TERM GOAL #2   Title Pt. will increase LEFS to >65 out of 80 to improve pain-free mobility.     Baseline LEFS: 55 out of 80 on 8/30   Time 4   Period Weeks   Status On-going   Target Date 08/20/17     PT LONG TERM GOAL #3   Title Pt. able to demonstrate proper technique with doming on B plantar aspects of feet to increase arch mobility/ decrease pain.     Baseline limited arch/ poor control/ plantar muscle weakness noted.    Time 4   Period Weeks   Status Not Met   Target Date 08/20/17     PT LONG TERM GOAL #4   Title Pt. able to complete 30 minutes of standing/walking at work with no c/o B foot pain.     Baseline Increase B foot pain with prolonged standing/walking >3/10     Time 4   Period Weeks   Status Not Met   Target Date 08/20/17          Patient will benefit from skilled therapeutic intervention in order to improve the following deficits and impairments:  Pain, Decreased mobility, Decreased activity tolerance, Decreased range of motion, Decreased strength, Hypomobility, Difficulty walking  Visit Diagnosis: Bilateral foot  pain  Joint stiffness of both feet  Gait difficulty     Problem List Patient Active Problem List   Diagnosis Date Noted  . 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 Pes planus 06/11/2017  . Abnormal gait 06/11/2017  . Influenza 12/22/2016  . Elevated blood pressure 06/19/2014  . Family history of hyperlipidemia 06/19/2014   Pura Spice, PT, DPT # [phone] 07/30/2017, 3:16 PM  [place] [facility] [address] [place], [place], [address] Phone: [phone]   Fax:  [phone]  Name: DOMINION KATHAN MRN: [id number] Date of Birth: July 30, 1976

## 2017-08-06 ENCOUNTER — Ambulatory Visit: Payer: 59 | Admitting: Physical Therapy

## 2017-08-06 DIAGNOSIS — M25674 Stiffness of right foot, not elsewhere classified: Secondary | ICD-10-CM | POA: Diagnosis not present

## 2017-08-06 DIAGNOSIS — M79672 Pain in left foot: Secondary | ICD-10-CM | POA: Diagnosis not present

## 2017-08-06 DIAGNOSIS — R269 Unspecified abnormalities of gait and mobility: Secondary | ICD-10-CM | POA: Diagnosis not present

## 2017-08-06 DIAGNOSIS — M25675 Stiffness of left foot, not elsewhere classified: Secondary | ICD-10-CM

## 2017-08-06 DIAGNOSIS — M79671 Pain in right foot: Secondary | ICD-10-CM | POA: Diagnosis not present

## 2017-08-07 NOTE — Therapy (Addendum)
[place] [facility] [address] [place], [place], [address] Phone: [phone]   Fax:  [phone]  Physical Therapy Treatment  Patient Details  Name: Tanner Frye MRN: [id number] Date of Birth: Dec 31, 1975 Referring Provider: Dr. Micheline Chapman  Encounter Date: 08/06/2017      PT End of Session - 08/12/17 1539    Visit Number 6   Number of Visits 8   Date for PT Re-Evaluation 08/20/17   PT Start Time 0071   PT Stop Time 1703   PT Time Calculation (min) 47 min   Activity Tolerance Patient tolerated treatment well;Patient limited by pain   Behavior During Therapy [facility] for tasks assessed/performed      Past Medical History:  Diagnosis Date  . Anal fissure   . Elevated BP without diagnosis of hypertension   . GERD (gastroesophageal reflux disease)     Past Surgical History:  Procedure Laterality Date  . COLONOSCOPY WITH PROPOFOL N/A 04/27/2017   Procedure: COLONOSCOPY WITH PROPOFOL;  Surgeon: Jonathon Bellows, MD;  Location: [facility] ENDOSCOPY;  Service: Endoscopy;  Laterality: N/A;  . HERNIA REPAIR      There were no vitals filed for this visit.    Pt. states he is scheduled to return to MD next Tuesday.  Pt. wants to continue with PT at this time because he feels it is helping and he is not interested in more invasive tx. options.         Manual tx.: Supine L/R TC AP mobs. 2x30 sec. Static gastroc/ soleus stretches with slight IV or EV in pain tolerable range. STM to medial ankle joint/ plantar aspects of foot. Prone cuboid/ navicular mobs.  Deep STM to B plantar aspects of foot with occasional great toe ext.  Therex.: isometrics (max. Resistance)- 5x each plane.  BOSU step ups/ static standing/ marching.  Balance disc standing/ SLS (UE assist in //-bars).  Lateral walking with increase knee flexion/ DF (trying to wt. On lateral aspect of feet)- 5x each direction in //-bars.  Standing heel raises with eccentric gastroc control 10x2.   Pt. Instructed to ice if any soreness/ pain after ex. Session.        Pt. progressing with higher level stability ex. in clinic (BOSU with min. to no UE assist).  Sidestepping good knee posture but moderate pes planus with shoes/orthorsis on.  Pt. continues to wear shoes that are loose in ankle/ achilles region (moderate spacing noted).  PT emphasized the importance of proper fitting shoes to support ankle/ arch.     DISCUSS MD f/u APPT.  Check for any change in tx.        PT Long Term Goals - 07/24/17 1502      PT LONG TERM GOAL #1   Title Pt. I with HEP to increase B ankle IV/EV to 5/5 MMT with no c/o foot pain to improve foot control with walking.    Baseline B ankle IV/EV 4+/5 MMT (pain reported).    Time 4   Period Weeks   Status Partially Met   Target Date 08/20/17     PT LONG TERM GOAL #2   Title Pt. will increase LEFS to >65 out of 80 to improve pain-free mobility.     Baseline LEFS: 55 out of 80 on 8/30   Time 4   Period Weeks   Status On-going   Target Date 08/20/17     PT LONG TERM GOAL #3   Title Pt. able to demonstrate proper  technique with doming on B plantar aspects of feet to increase arch mobility/ decrease pain.     Baseline limited arch/ poor control/ plantar muscle weakness noted.    Time 4   Period Weeks   Status Not Met   Target Date 08/20/17     PT LONG TERM GOAL #4   Title Pt. able to complete 30 minutes of standing/walking at work with no c/o B foot pain.     Baseline Increase B foot pain with prolonged standing/walking >3/10     Time 4   Period Weeks   Status Not Met   Target Date 08/20/17             Patient will benefit from skilled therapeutic intervention in order to improve the following deficits and impairments:  Pain, Decreased mobility, Decreased activity tolerance, Decreased range of motion, Decreased strength, Hypomobility, Difficulty walking  Visit Diagnosis: Bilateral foot pain  Joint stiffness of both feet  Gait  difficulty     Problem List Patient Active Problem List   Diagnosis Date Noted  . 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 Pes planus 06/11/2017  . Abnormal gait 06/11/2017  . Influenza 12/22/2016  . Elevated blood pressure 06/19/2014  . Family history of hyperlipidemia 06/19/2014   Pura Spice, PT, DPT # [id number] 08/12/2017, 3:44 PM  [place] [facility] [address] [place], [place], [address] Phone: [phone]   Fax:  [phone]  Name: Tanner Frye MRN: [id number] Date of Birth: May 24, 1976

## 2017-08-08 ENCOUNTER — Other Ambulatory Visit: Payer: Self-pay | Admitting: Family Medicine

## 2017-08-09 ENCOUNTER — Ambulatory Visit
Admission: RE | Admit: 2017-08-09 | Discharge: 2017-08-09 | Disposition: A | Discharge status: Home / Self Care | Payer: 59 | Source: Ambulatory Visit | Attending: Sports Medicine | Admitting: Sports Medicine

## 2017-08-09 DIAGNOSIS — M25572 Pain in left ankle and joints of left foot: Secondary | ICD-10-CM

## 2017-08-09 DIAGNOSIS — M19072 Primary osteoarthritis, left ankle and foot: Secondary | ICD-10-CM | POA: Diagnosis not present

## 2017-08-10 ENCOUNTER — Ambulatory Visit (INDEPENDENT_AMBULATORY_CARE_PROVIDER_SITE_OTHER): Payer: 59 | Admitting: Sports Medicine

## 2017-08-10 VITALS — BP 138/102 | Ht 76.0 in | Wt 260.0 lb

## 2017-08-10 DIAGNOSIS — M76821 Posterior tibial tendinitis, right leg: Secondary | ICD-10-CM

## 2017-08-10 DIAGNOSIS — M25572 Pain in left ankle and joints of left foot: Secondary | ICD-10-CM

## 2017-08-10 DIAGNOSIS — M76822 Posterior tibial tendinitis, left leg: Secondary | ICD-10-CM

## 2017-08-10 NOTE — Progress Notes (Signed)
   Subjective:    Patient ID: Tanner Frye, male    DOB: 1976-10-08, 41 y.o.   MRN: [id number]  HPI   Tanner Frye comes in today for follow-up on bilateral foot pain. Right foot is feeling pretty good. Left foot is still painful. His pain is along the medial aspect of the ankle and medial aspect of the foot. He's not sure whether or not the custom orthotics have been helpful. He continues with physical therapy. The physical therapist has noted that he has quite a bit of left ankle instability.   Review of Systems As above    Objective:   Physical Exam  Well-developed, well-nourished. No acute distress. Awake alert and oriented 3. Vital signs reviewed.  Left ankle: 2+ talar tilt with a slightly positive anterior drawer. No swelling. No effusion. Slight tenderness to palpation along the posterior tibialis tendon. Slight tenderness to palpation along the arch as well as the medial plantar aspect of the foot. No real pain along the metatarsal heads. Good pulses. Walking without a limp.  Right ankle: Negative anterior drawer, negative talar tilt. No tenderness to palpation.        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 I've also recommended that he try a body helix compression sleeve. He definitely has left ankle instability and I've encouraged him to continue with physical therapy until ready for discharge. After that, he will need to continue with home exercises.I would like to see him back in the office in another 4-6 weeks for reevaluation. Patient is in agreement with this plan.

## 2017-08-12 ENCOUNTER — Ambulatory Visit: Payer: 59 | Admitting: Physical Therapy

## 2017-08-12 ENCOUNTER — Encounter: Payer: Self-pay | Admitting: Physical Therapy

## 2017-08-12 DIAGNOSIS — R269 Unspecified abnormalities of gait and mobility: Secondary | ICD-10-CM | POA: Diagnosis not present

## 2017-08-12 DIAGNOSIS — M25675 Stiffness of left foot, not elsewhere classified: Secondary | ICD-10-CM

## 2017-08-12 DIAGNOSIS — M79671 Pain in right foot: Secondary | ICD-10-CM

## 2017-08-12 DIAGNOSIS — M25674 Stiffness of right foot, not elsewhere classified: Secondary | ICD-10-CM

## 2017-08-12 DIAGNOSIS — M79672 Pain in left foot: Secondary | ICD-10-CM | POA: Diagnosis not present

## 2017-08-12 NOTE — Therapy (Signed)
[place] [facility] [address] [place], [place], [address] Phone: [phone]   Fax:  [phone]  Physical Therapy Treatment  Patient Details  Name: Tanner Frye MRN: [id number] Date of Birth: [birth year] Referring Provider: Dr. Micheline Chapman  Encounter Date: 08/12/2017      PT End of Session - 08/12/17 1620    Visit Number 7   Number of Visits 8   Date for PT Re-Evaluation 08/20/17   PT Start Time 1620   PT Stop Time 1706   PT Time Calculation (min) 46 min   Activity Tolerance Patient tolerated treatment well;Patient limited by pain   Behavior During Therapy [facility] for tasks assessed/performed      Past Medical History:  Diagnosis Date  . Anal fissure   . Elevated BP without diagnosis of hypertension   . GERD (gastroesophageal reflux disease)     Past Surgical History:  Procedure Laterality Date  . COLONOSCOPY WITH PROPOFOL N/A 04/27/2017   Procedure: COLONOSCOPY WITH PROPOFOL;  Surgeon: Jonathon Bellows, MD;  Location: [facility] ENDOSCOPY;  Service: Endoscopy;  Laterality: N/A;  . HERNIA REPAIR      There were no vitals filed for this visit.      Subjective Assessment - 08/12/17 1617    Subjective Pt has been using icyhot with some relief.  He has been rolling his sore region on a golf ball with some relief as well.  Pt had more padding added to his insert on 10/23 and was told to begin using compression sock, pt reports he has not noticed a difference from this yet.  He reports he has not noticed a change in his pain since starting therapy.    Limitations Standing;Walking   Diagnostic tests see chart   Patient Stated Goals Increase B ankle/foot stability and pain-free mobility.     Currently in Pain? Yes   Pain Score 5    Pain Location Ankle   Pain Orientation Left   Pain Descriptors / Indicators Aching   Pain Type Chronic pain   Pain Onset More than a month ago   Pain Frequency Constant   Multiple Pain Sites No       TREATMENT    Manual Therapy:  STM L peroneals with noted trigger points and reproduction of pt's pain  STM and ischemic compression L gastroc with trigger points noted    Therapeutic Exercise:  Seated inversion and eversion stretching with 20 second holds x3 each direction. Discontinued inversion on first bout due to sharp pain at insertion site of peroneus longus.  Standing soleus stretch with 30 second holds x4  Calf raises with focus on eccentric phase 3x10 with significant fatigue at end of each set  SLS on airex pad 3x30 seconds each LE pt reports fatigue in calf and anterior ankle but denies pain in main symptomatic region  Seated eversion with GTB 2x20  Seated inversion with GTB 2x10             PT Education - 08/12/17 1620    Education provided Yes   Education Details Exercise technique; reasoning behind interventions; emphasized importance of completing HEP as prescribed   Person(s) Educated Patient   Methods Explanation;Demonstration;Verbal cues   Comprehension Verbalized understanding;Returned demonstration;Verbal cues required             PT Long Term Goals - 07/24/17 1502      PT LONG TERM GOAL #1   Title Pt. I with HEP to increase B ankle IV/EV to  5/5 MMT with no c/o foot pain to improve foot control with walking.    Baseline B ankle IV/EV 4+/5 MMT (pain reported).    Time 4   Period Weeks   Status Partially Met   Target Date 08/20/17     PT LONG TERM GOAL #2   Title Pt. will increase LEFS to >65 out of 80 to improve pain-free mobility.     Baseline LEFS: 55 out of 80 on 8/30   Time 4   Period Weeks   Status On-going   Target Date 08/20/17     PT LONG TERM GOAL #3   Title Pt. able to demonstrate proper technique with doming on B plantar aspects of feet to increase arch mobility/ decrease pain.     Baseline limited arch/ poor control/ plantar muscle weakness noted.    Time 4   Period Weeks   Status Not Met   Target Date 08/20/17     PT LONG TERM GOAL  #4   Title Pt. able to complete 30 minutes of standing/walking at work with no c/o B foot pain.     Baseline Increase B foot pain with prolonged standing/walking >3/10     Time 4   Period Weeks   Status Not Met   Target Date 08/20/17               Plan - 08/12/17 1754    Clinical Impression Statement Pt demonstrates significant fatigue with eccentric heel raises on step with rest break between sets.  He demonstrates instability and fatigue with SLS on each LE (LLE>RLE) and encouraged pt to continue completing this exercise as part of his HEP.  STM to L peroneals reproduced his pain and pt reported relief following.  He will benefit from continued skilled PT interventions for improved strength and instability, decrease in pain, and hopeful return to basketball.   Rehab Potential Good   PT Frequency 1x / week   PT Duration 4 weeks   PT Treatment/Interventions Cryotherapy;Electrical Stimulation;Iontophoresis 41m/ml Dexamethasone;Moist Heat;Gait training;Stair training;Functional mobility training;Therapeutic activities;Balance training;Therapeutic exercise;Orthotic Fit/Training;Patient/family education;Neuromuscular re-education;Manual techniques;Scar mobilization;Passive range of motion;Dry needling   PT Next Visit Plan Progress ankle/ foot stability/  Check Doming of L arch and focus on strengthening ex.     Consulted and Agree with Plan of Care Patient      Patient will benefit from skilled therapeutic intervention in order to improve the following deficits and impairments:  Pain, Decreased mobility, Decreased activity tolerance, Decreased range of motion, Decreased strength, Hypomobility, Difficulty walking  Visit Diagnosis: Bilateral foot pain  Joint stiffness of both feet  Gait difficulty     Problem List Patient Active Problem List   Diagnosis Date Noted  . 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 Pes planus 06/11/2017  . Abnormal gait  06/11/2017  . Influenza 12/22/2016  . Elevated blood pressure 06/19/2014  . Family history of hyperlipidemia 06/19/2014    ACollie SiadPT, DPT 08/12/2017, 5:57 PM  [place] [facility] [place] [address]Phone: [phone]  Fax:  [phone] Name: Tanner ADVANIMRN: [id number] of Birth: 105-Sep-1977

## 2017-08-15 ENCOUNTER — Telehealth: Payer: 59 | Admitting: Family

## 2017-08-15 DIAGNOSIS — J208 Acute bronchitis due to other specified organisms: Secondary | ICD-10-CM

## 2017-08-15 MED ORDER — BENZONATATE 100 MG PO CAPS: 100.0000 mg | ORAL_CAPSULE | Freq: Three times a day (TID) | ORAL | 0 refills | Status: DC | PRN

## 2017-08-15 NOTE — Progress Notes (Signed)

## 2017-08-19 ENCOUNTER — Ambulatory Visit: Payer: 59 | Attending: Sports Medicine | Admitting: Physical Therapy

## 2017-08-19 DIAGNOSIS — M25675 Stiffness of left foot, not elsewhere classified: Secondary | ICD-10-CM | POA: Diagnosis not present

## 2017-08-19 DIAGNOSIS — M25674 Stiffness of right foot, not elsewhere classified: Secondary | ICD-10-CM | POA: Insufficient documentation

## 2017-08-19 DIAGNOSIS — M79671 Pain in right foot: Secondary | ICD-10-CM | POA: Diagnosis not present

## 2017-08-19 DIAGNOSIS — R269 Unspecified abnormalities of gait and mobility: Secondary | ICD-10-CM | POA: Diagnosis not present

## 2017-08-19 DIAGNOSIS — M79672 Pain in left foot: Secondary | ICD-10-CM | POA: Diagnosis not present

## 2017-08-20 NOTE — Therapy (Addendum)
[place] [address] [place], [place], [address] Phone: [phone]   Fax:  [phone]  Physical Therapy Treatment  Patient Details  Name: Tanner Frye MRN: [id number] Date of Birth: 1976-05-28 Referring Provider: Dr. Micheline Chapman  Encounter Date: 08/19/2017      PT End of Session - 08/20/17 1605    Visit Number 8   Number of Visits 8   Date for PT Re-Evaluation 08/20/17   PT Start Time 3212   PT Stop Time 1739   PT Time Calculation (min) 45 min   Activity Tolerance Patient tolerated treatment well;Patient limited by pain   Behavior During Therapy [facility] for tasks assessed/performed      Past Medical History:  Diagnosis Date  . Anal fissure   . Elevated BP without diagnosis of hypertension   . GERD (gastroesophageal reflux disease)     Past Surgical History:  Procedure Laterality Date  . COLONOSCOPY WITH PROPOFOL N/A 04/27/2017   Procedure: COLONOSCOPY WITH PROPOFOL;  Surgeon: Jonathon Bellows, MD;  Location: [facility] ENDOSCOPY;  Service: Endoscopy;  Laterality: N/A;  . HERNIA REPAIR      There were no vitals filed for this visit.      Subjective Assessment - 08/20/17 1604    Limitations Standing;Walking   Diagnostic tests see chart   Patient Stated Goals Increase B ankle/foot stability and pain-free mobility.     Currently in Pain? No/denies         Pt. reports no pain currently at rest but pain worsens with work-related tasks.          TREATMENT   Manual Therapy:  STM L peroneals/ achilles/ medial arch (trigger point release techniques) STM and ischemic compression L gastroc with trigger points noted    Therapeutic Exercise:  Seated inversion and eversion stretching with 20 second holds x3 each direction.  Standing soleus stretch with 30 second holds x4  Calf raises with focus on eccentric phase 3x10 with significant fatigue at end of each set  BOSU step ups/downs at 6"step Seated eversion with GTB  2x20  Seated inversion with GTB 2x10 Instructed in doming of R/L arch in seated posture.       Slight increase in L medial arch/ foot with doming ex. Pt. has good strength/ progressing stability with use of BOSU in //-bars. Minimal UE assist required for safety/balance. Added doming to HEP.          PT Long Term Goals - 07/24/17 1502      PT LONG TERM GOAL #1   Title Pt. I with HEP to increase B ankle IV/EV to 5/5 MMT with no c/o foot pain to improve foot control with walking.    Baseline B ankle IV/EV 4+/5 MMT (pain reported).    Time 4   Period Weeks   Status Partially Met   Target Date 08/20/17     PT LONG TERM GOAL #2   Title Pt. will increase LEFS to >65 out of 80 to improve pain-free mobility.     Baseline LEFS: 55 out of 80 on 8/30   Time 4   Period Weeks   Status On-going   Target Date 08/20/17     PT LONG TERM GOAL #3   Title Pt. able to demonstrate proper technique with doming on B plantar aspects of feet to increase arch mobility/ decrease pain.     Baseline limited arch/ poor control/ plantar muscle weakness noted.    Time 4  Period Weeks   Status Not Met   Target Date 08/20/17     PT LONG TERM GOAL #4   Title Pt. able to complete 30 minutes of standing/walking at work with no c/o B foot pain.     Baseline Increase B foot pain with prolonged standing/walking >3/10     Time 4   Period Weeks   Status Not Met   Target Date 08/20/17               Plan - 08/20/17 1605    Clinical Presentation Stable   Clinical Decision Making Low   Rehab Potential Good   PT Frequency 1x / week   PT Duration 4 weeks   PT Treatment/Interventions Cryotherapy;Electrical Stimulation;Iontophoresis 39m/ml Dexamethasone;Moist Heat;Gait training;Stair training;Functional mobility training;Therapeutic activities;Balance training;Therapeutic exercise;Orthotic Fit/Training;Patient/family education;Neuromuscular re-education;Manual techniques;Scar mobilization;Passive range of  motion;Dry needling   PT Next Visit Plan Progress ankle/ foot stability/  Check Doming of L arch and focus on strengthening ex.     Consulted and Agree with Plan of Care Patient      Patient will benefit from skilled therapeutic intervention in order to improve the following deficits and impairments:  Pain, Decreased mobility, Decreased activity tolerance, Decreased range of motion, Decreased strength, Hypomobility, Difficulty walking  Visit Diagnosis: Bilateral foot pain  Joint stiffness of both feet  Gait difficulty     Problem List Patient Active Problem List   Diagnosis Date Noted  . 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 Pes planus 06/11/2017  . Abnormal gait 06/11/2017  . Influenza 12/22/2016  . Elevated blood pressure 06/19/2014  . Family history of hyperlipidemia 06/19/2014   MPura Spice PT, DPT # [id number]/11/2016, 4:06 PM  [place] [facility] [address]Phone: [phone]  Fax:  [phone] Name: DNEALE MARZETTEMRN: [id number] of Birth: 1Nov 06, 1977

## 2017-08-26 ENCOUNTER — Ambulatory Visit: Payer: 59 | Admitting: Physical Therapy

## 2017-08-26 DIAGNOSIS — M25674 Stiffness of right foot, not elsewhere classified: Secondary | ICD-10-CM

## 2017-08-26 DIAGNOSIS — M25675 Stiffness of left foot, not elsewhere classified: Secondary | ICD-10-CM | POA: Diagnosis not present

## 2017-08-26 DIAGNOSIS — M79671 Pain in right foot: Secondary | ICD-10-CM

## 2017-08-26 DIAGNOSIS — R269 Unspecified abnormalities of gait and mobility: Secondary | ICD-10-CM | POA: Diagnosis not present

## 2017-08-26 DIAGNOSIS — M79672 Pain in left foot: Secondary | ICD-10-CM | POA: Diagnosis not present

## 2017-08-27 ENCOUNTER — Encounter: Payer: Self-pay | Admitting: Physical Therapy

## 2017-08-27 NOTE — Therapy (Addendum)
[place] [facility] [address] [place], [place], [address] Phone: [phone]   Fax:  [phone]  Physical Therapy Treatment  Patient Details  Name: Tanner Frye MRN: [id number] Date of Birth: 12-Jan-1976 No data recorded   Encounter Date: 08/26/2017    PT End of Session - 08/27/17 1739    Visit Number  9    Number of Visits  13    Date for PT Re-Evaluation  09/23/17    PT Start Time  1630    PT Stop Time  1713    PT Time Calculation (min)  43 min    Activity Tolerance  Patient tolerated treatment well;Patient limited by pain    Behavior During Therapy  [facility] for tasks assessed/performed       Past Medical History:  Diagnosis Date  . Anal fissure   . Elevated BP without diagnosis of hypertension   . GERD (gastroesophageal reflux disease)     Past Surgical History:  Procedure Laterality Date  . HERNIA REPAIR      There were no vitals filed for this visit.     Pt. reports some soreness/ discomfort (2/10) in L foot prior to tx. session. Pt. reports working all day and doing a lot of walking.        Manual tx.:   Supine L/R TC AP mobs. 2x30 sec.  Static gastroc/ soleus stretches with slight IV or EV in pain tolerable range.  STM to medial ankle joint/ plantar aspects of foot.  Prone cuboid/ navicular mobs.  Deep STM to B plantar aspects of foot with occasional great toe ext.  Therex.: isometrics (max. Resistance)- 5x each plane.  BOSU step ups/ static standing/ marching.  Balance disc standing/ SLS (UE assist in //-bars).  Standing prostretch/ stair stretch.         Pt. continues to c/o L medial arch/ ankle discomfort during manual tx./ STM. Pt. is wearing shoes that are loose in ankle/ achilles region (moderate spacing noted). PT educated pt in proper fitting shoes to support ankle/ arch., esp. with work-related tasks. Pt. has good ankle stability with isometrics but slight difficulty noted during BOSU/  BAPS board ex. Pt. will continue with PT for a few more tx. sessions with focus on strengthening/ pain mgmt.       PT Long Term Goals - 08/28/17 1310      PT LONG TERM GOAL #1   Title  Pt. I with HEP to increase B ankle IV/EV to 5/5 MMT with no c/o foot pain to improve foot control with walking.     Baseline  B ankle IV/EV 4+/5 MMT (pain reported).     Time  4    Period  Weeks    Status  Partially Met    Target Date  09/23/17      PT LONG TERM GOAL #2   Title  Pt. will increase LEFS to >65 out of 80 to improve pain-free mobility.      Baseline  LEFS: 55 out of 80 on 8/30.  08/26/17: 61/80    Time  4    Period  Weeks    Status  Partially Met    Target Date  09/23/17      PT LONG TERM GOAL #3   Title  Pt. able to demonstrate proper technique with doming on B plantar aspects of feet to increase arch mobility/ decrease pain.      Baseline  limited arch/ poor control/ plantar  muscle weakness noted.     Time  4    Period  Weeks    Status  Partially Met    Target Date  09/23/18      PT LONG TERM GOAL #4   Title  Pt. able to complete 30 minutes of standing/walking at work with no c/o B foot pain.      Baseline  Increase B foot pain with prolonged standing/walking >3/10      Time  4    Period  Weeks    Status  Partially Met    Target Date  09/23/17              Patient will benefit from skilled therapeutic intervention in order to improve the following deficits and impairments:  Pain, Decreased mobility, Decreased activity tolerance, Decreased range of motion, Decreased strength, Hypomobility, Difficulty walking  Visit Diagnosis: Joint stiffness of both feet  Bilateral foot pain  Gait difficulty     Problem List Patient Active Problem List   Diagnosis Date Noted  . Elevated hemoglobin A1c 03/29/2018  . Mixed hyperlipidemia 03/29/2018  . Elevated LFTs 03/29/2018  . Obesity (BMI 30.0-34.9) 03/25/2018  . 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  06/11/2017  . Pes planus 06/11/2017  . Abnormal gait 06/11/2017  . Elevated BP without diagnosis of hypertension 06/19/2014  . Family history of hyperlipidemia 06/19/2014   Pura Spice, PT, DPT # [id number] 08/29/2017, 2:23 PM  [place] [facility] [address] [place], [place], [address] Phone: [phone]   Fax:  [phone]  Name: Tanner Frye MRN: [id number] Date of Birth: 1976-01-04

## 2017-09-02 ENCOUNTER — Ambulatory Visit: Payer: 59 | Admitting: Physical Therapy

## 2017-09-02 DIAGNOSIS — M79671 Pain in right foot: Secondary | ICD-10-CM | POA: Diagnosis not present

## 2017-09-02 DIAGNOSIS — M25674 Stiffness of right foot, not elsewhere classified: Secondary | ICD-10-CM | POA: Diagnosis not present

## 2017-09-02 DIAGNOSIS — M79672 Pain in left foot: Secondary | ICD-10-CM

## 2017-09-02 DIAGNOSIS — R269 Unspecified abnormalities of gait and mobility: Secondary | ICD-10-CM

## 2017-09-02 DIAGNOSIS — M25675 Stiffness of left foot, not elsewhere classified: Principal | ICD-10-CM

## 2017-09-03 ENCOUNTER — Encounter: Payer: Self-pay | Admitting: Physical Therapy

## 2017-09-03 NOTE — Therapy (Addendum)
[facility] [address] [place], [place], [address] Phone: [phone]   Fax:  [phone]  Physical Therapy Treatment  Patient Details  Name: Tanner Frye MRN: [id number] Date of Birth: 11/10/1975 No data recorded   Encounter Date: 09/02/2017    PT End of Session - 09/03/17 1411    Visit Number  10    Number of Visits  13    Date for PT Re-Evaluation  09/23/17    PT Start Time  1631    PT Stop Time  1728    PT Time Calculation (min)  57 min    Activity Tolerance  Patient tolerated treatment well;Patient limited by pain    Behavior During Therapy  WFL for tasks assessed/performed       Past Medical History:  Diagnosis Date  . Anal fissure   . Elevated BP without diagnosis of hypertension   . GERD (gastroesophageal reflux disease)     Past Surgical History:  Procedure Laterality Date  . COLONOSCOPY WITH PROPOFOL N/A 04/27/2017   Performed by Anna, Kiran, MD at [facility]  . HERNIA REPAIR      There were no vitals filed for this visit.      No c/o pain at this time. Pt. states he has his good days and bad days. Pt. busy at work today.        TREATMENT   Manual Therapy:  STM L and R ankle stretches: focus on peroneals/ achilles/ medial arch (16 min.)  Therapeutic Exercise:  BAPS board with/without use of weights Doming on stairs  BOSU step ups/downs at 6"step (added in hip flexion) Seated eversion with GTB 2x20  Seated inversion with GTB 2x10 Discussed shoe wear         Pt. ambulates around PT clinic with improved gait pattern after manual stretches to L/R ankle/ forefoot. (+) medial arch tenderness during STM/ toe extension stretches. No changes to HEP at this time and PT recommended new footwear to support arch/ ankle.      PT Long Term Goals - 08/28/17 1310      PT LONG TERM GOAL #1   Title  Pt. I with HEP to increase B ankle IV/EV to 5/5 MMT with no c/o foot pain to improve foot  control with walking.     Baseline  B ankle IV/EV 4+/5 MMT (pain reported).     Time  4    Period  Weeks    Status  Partially Met    Target Date  09/23/17      PT LONG TERM GOAL #2   Title  Pt. will increase LEFS to >65 out of 80 to improve pain-free mobility.      Baseline  LEFS: 55 out of 80 on 8/30.  08/26/17: 61/80    Time  4    Period  Weeks    Status  Partially Met    Target Date  09/23/17      PT LONG TERM GOAL #3   Title  Pt. able to demonstrate proper technique with doming on B plantar aspects of feet to increase arch mobility/ decrease pain.      Baseline  limited arch/ poor control/ plantar muscle weakness noted.     Time  4    Period  Weeks    Status  Partially Met    Target Date  09/23/18      PT LONG TERM GOAL #4   Title  Pt. able   to complete 30 minutes of standing/walking at work with no c/o B foot pain.      Baseline  Increase B foot pain with prolonged standing/walking >3/10      Time  4    Period  Weeks    Status  Partially Met    Target Date  09/23/17        Patient will benefit from skilled therapeutic intervention in order to improve the following deficits and impairments:  Pain, Decreased mobility, Decreased activity tolerance, Decreased range of motion, Decreased strength, Hypomobility, Difficulty walking  Visit Diagnosis: Joint stiffness of both feet  Bilateral foot pain  Gait difficulty     Problem List Patient Active Problem List   Diagnosis Date Noted  . Elevated hemoglobin A1c 03/29/2018  . Mixed hyperlipidemia 03/29/2018  . Elevated LFTs 03/29/2018  . Obesity (BMI 30.0-34.9) 03/25/2018  . 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 Pes planus 06/11/2017  . Abnormal gait 06/11/2017  . Elevated BP without diagnosis of hypertension 06/19/2014  . Family history of hyperlipidemia 06/19/2014   Pura Spice, PT, DPT # [id number] 09/03/2017, 1:39 PM  [facility] [address] [place], [place], [address] Phone: [phone]   Fax:  [phone]  Name: Tanner Frye MRN: [id number] Date of Birth: 21-Aug-1976

## 2017-09-07 ENCOUNTER — Ambulatory Visit: Payer: 59 | Admitting: Sports Medicine

## 2017-09-16 ENCOUNTER — Encounter: Payer: 59 | Admitting: Physical Therapy

## 2017-09-23 ENCOUNTER — Ambulatory Visit: Payer: 59 | Attending: Sports Medicine | Admitting: Physical Therapy

## 2017-09-23 DIAGNOSIS — M79672 Pain in left foot: Secondary | ICD-10-CM | POA: Insufficient documentation

## 2017-09-23 DIAGNOSIS — M79671 Pain in right foot: Secondary | ICD-10-CM | POA: Diagnosis not present

## 2017-09-23 DIAGNOSIS — M25674 Stiffness of right foot, not elsewhere classified: Secondary | ICD-10-CM | POA: Insufficient documentation

## 2017-09-23 DIAGNOSIS — R269 Unspecified abnormalities of gait and mobility: Secondary | ICD-10-CM | POA: Diagnosis not present

## 2017-09-23 DIAGNOSIS — M25675 Stiffness of left foot, not elsewhere classified: Secondary | ICD-10-CM | POA: Insufficient documentation

## 2017-09-24 ENCOUNTER — Encounter: Payer: Self-pay | Admitting: Physical Therapy

## 2017-09-24 NOTE — Therapy (Addendum)
[facility] [address] [place], [place], [address] Phone: [phone]   Fax:  [phone]  Physical Therapy Treatment  Patient Details  Name: Tanner Frye MRN: [id number] Date of Birth: [birth year] No data recorded   Encounter Date: 09/23/2017    PT End of Session - 09/24/17 1804    Visit Number  11    Number of Visits  13    Date for PT Re-Evaluation  09/23/17    PT Start Time  1632    PT Stop Time  1720    PT Time Calculation (min)  48 min    Activity Tolerance  Patient tolerated treatment well;Patient limited by pain    Behavior During Therapy  [facility] for tasks assessed/performed       Past Medical History:  Diagnosis Date  . Anal fissure   . Elevated BP without diagnosis of hypertension   . GERD (gastroesophageal reflux disease)     Past Surgical History:  Procedure Laterality Date  . COLONOSCOPY WITH PROPOFOL N/A 04/27/2017   Procedure: COLONOSCOPY WITH PROPOFOL;  Surgeon: Jonathon Bellows, MD;  Location: [facility] ENDOSCOPY;  Service: Endoscopy;  Laterality: N/A;  . HERNIA REPAIR          Pt. states he is doing better but not 100%. Pt. reports inconsistency with HEP/ stretches.     Therex:  Reviewed HEP in depth (see handouts)- pt. Instructed to avoid any painful movement patterns  Manual tx.: Supine L/R ankle stretches (all planes)- reassessed mobility (navicular/ cuboid/ subtalar joint). STM to B medial arches/ distal achilles.          PT reviewed all stretches/ ther.ex. with pt. to focus on a more independent ex. program. Pt. instructed to contact PT if any regression or worsening in pain symptoms over next weeks/month. Tenderness remains in medial arch with palpation of fascia tissue. Good ankle AROM/ strength testing 5/5 MMT.     PT Long Term Goals - 08/28/17 1310      PT LONG TERM GOAL #1   Title  Pt. I with HEP to increase B ankle IV/EV to 5/5 MMT with no c/o foot pain to improve foot control with  walking.     Baseline  B ankle IV/EV 4+/5 MMT (pain reported).     Time  4    Period  Weeks    Status  Partially Met    Target Date  09/23/17      PT LONG TERM GOAL #2   Title  Pt. will increase LEFS to >65 out of 80 to improve pain-free mobility.      Baseline  LEFS: 55 out of 80 on 8/30.  08/26/17: 61/80    Time  4    Period  Weeks    Status  Partially Met    Target Date  09/23/17      PT LONG TERM GOAL #3   Title  Pt. able to demonstrate proper technique with doming on B plantar aspects of feet to increase arch mobility/ decrease pain.      Baseline  limited arch/ poor control/ plantar muscle weakness noted.     Time  4    Period  Weeks    Status  Partially Met    Target Date  09/23/18      PT LONG TERM GOAL #4   Title  Pt. able to complete 30 minutes of standing/walking at work with no c/o B foot pain.  Baseline  Increase B foot pain with prolonged standing/walking >3/10      Time  4    Period  Weeks    Status  Partially Met    Target Date  09/23/17              Patient will benefit from skilled therapeutic intervention in order to improve the following deficits and impairments:  Pain, Decreased mobility, Decreased activity tolerance, Decreased range of motion, Decreased strength, Hypomobility, Difficulty walking  Visit Diagnosis: Joint stiffness of both feet  Bilateral foot pain  Gait difficulty     Problem List Patient Active Problem List   Diagnosis Date Noted  . Elevated hemoglobin A1c 03/29/2018  . Mixed hyperlipidemia 03/29/2018  . Elevated LFTs 03/29/2018  . Obesity (BMI 30.0-34.9) 03/25/2018  . 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 Pes planus 06/11/2017  . Abnormal gait 06/11/2017  . Elevated BP without diagnosis of hypertension 06/19/2014  . Family history of hyperlipidemia 06/19/2014   Tanner Frye, PT, DPT # [id number] 09/24/17, 6:55 PM  [facility] [address] [place], [place], [address] Phone: [phone]   Fax:  [phone]  Name: Tanner Frye MRN: [id number] Date of Birth: January 01, 1976

## 2017-09-28 DIAGNOSIS — H5213 Myopia, bilateral: Secondary | ICD-10-CM | POA: Diagnosis not present

## 2018-02-10 IMAGING — CR DG ANKLE COMPLETE 3+V*L*
3 series · 4 of 4 positions shown · non-contrast
Comparison: 12/11/2016 ankle MRI

CLINICAL DATA: Chronic ankle pain. History of plantar fasciitis.
Medial pain.

EXAM:
LEFT ANKLE COMPLETE - 3+ VIEW

[ankle ap]
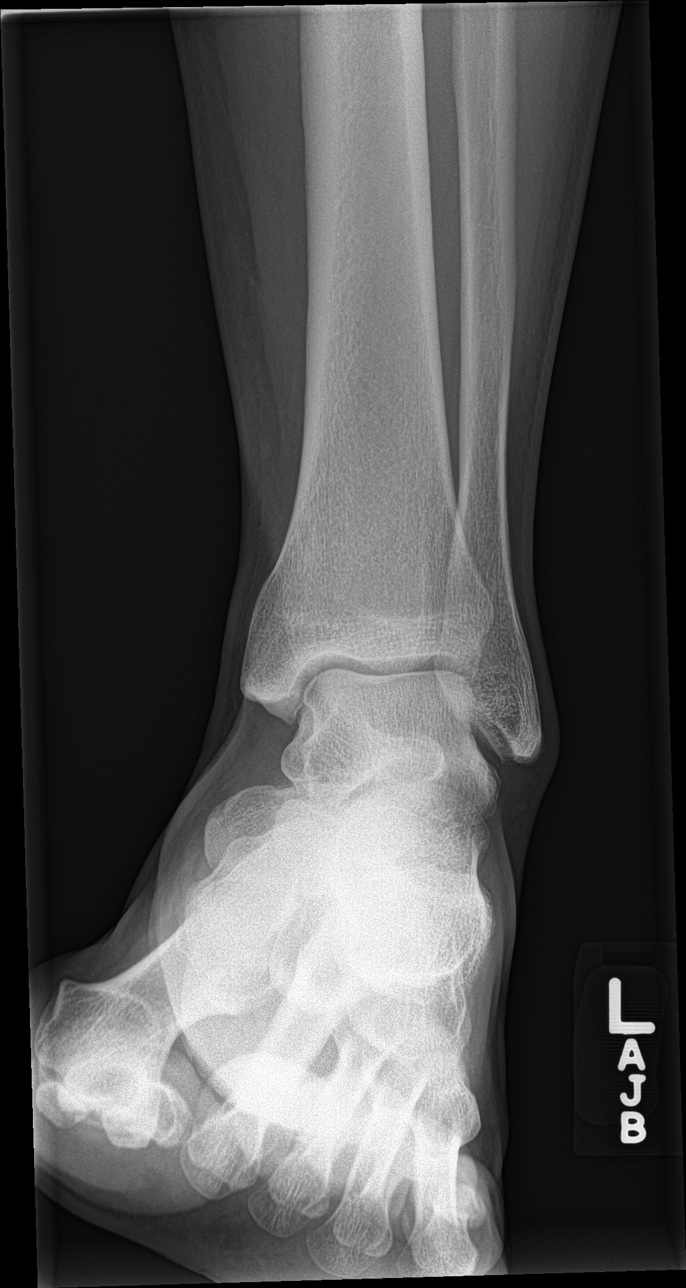

[Series 2: ankle obl · 0.14mm/px · 2 of 2 slices shown]
[im 1/2]
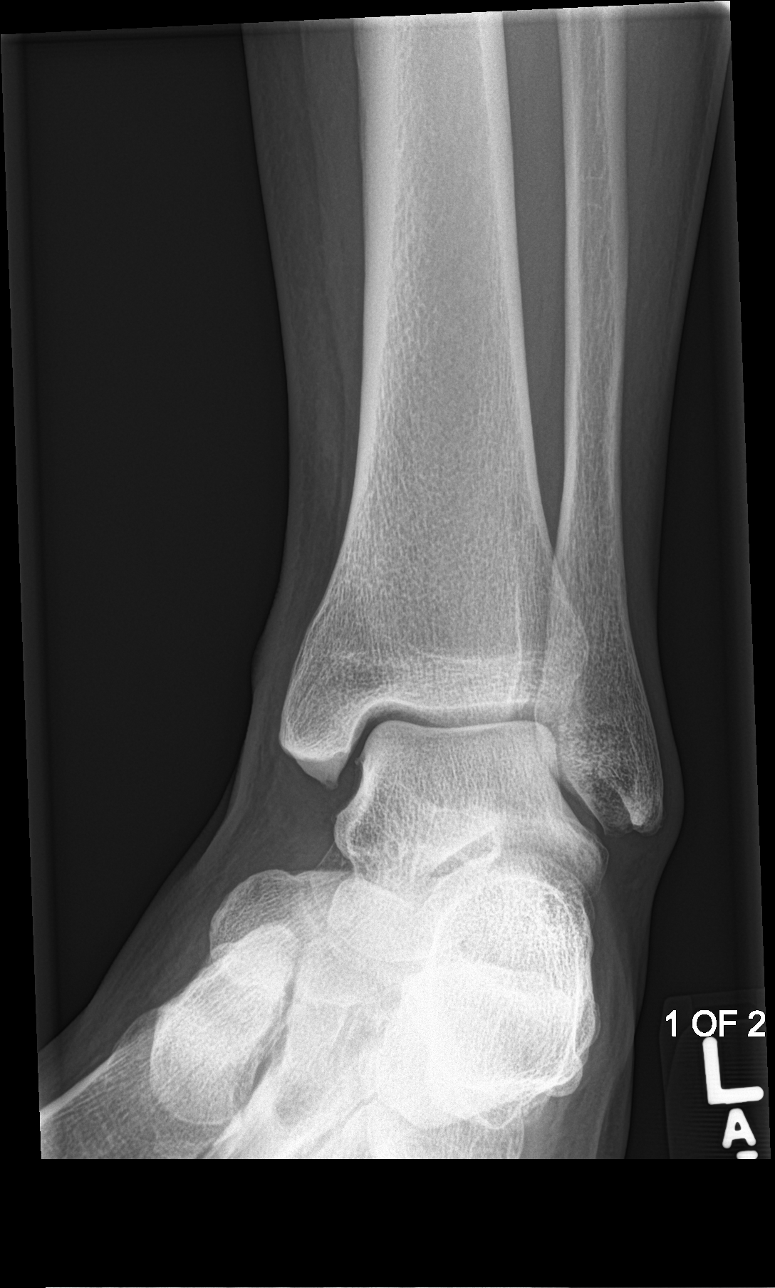
[im 2/2]
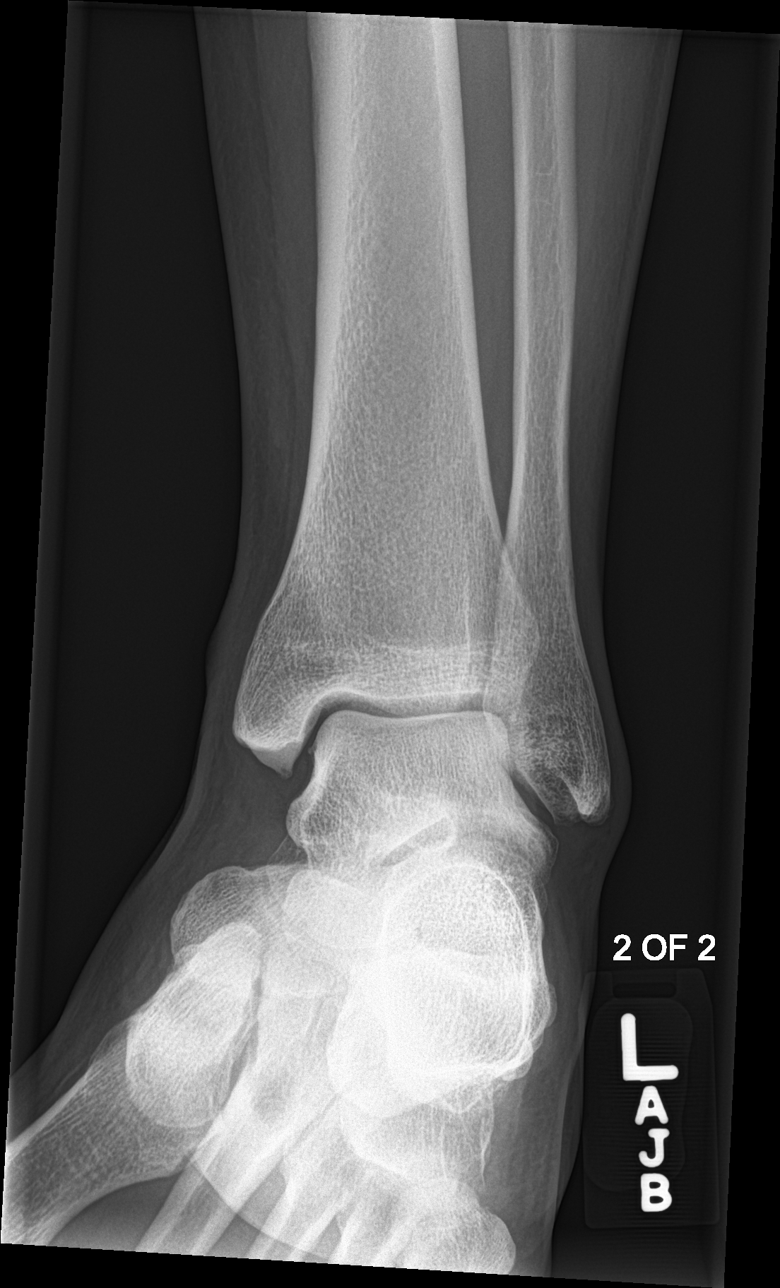

[ankle lat]
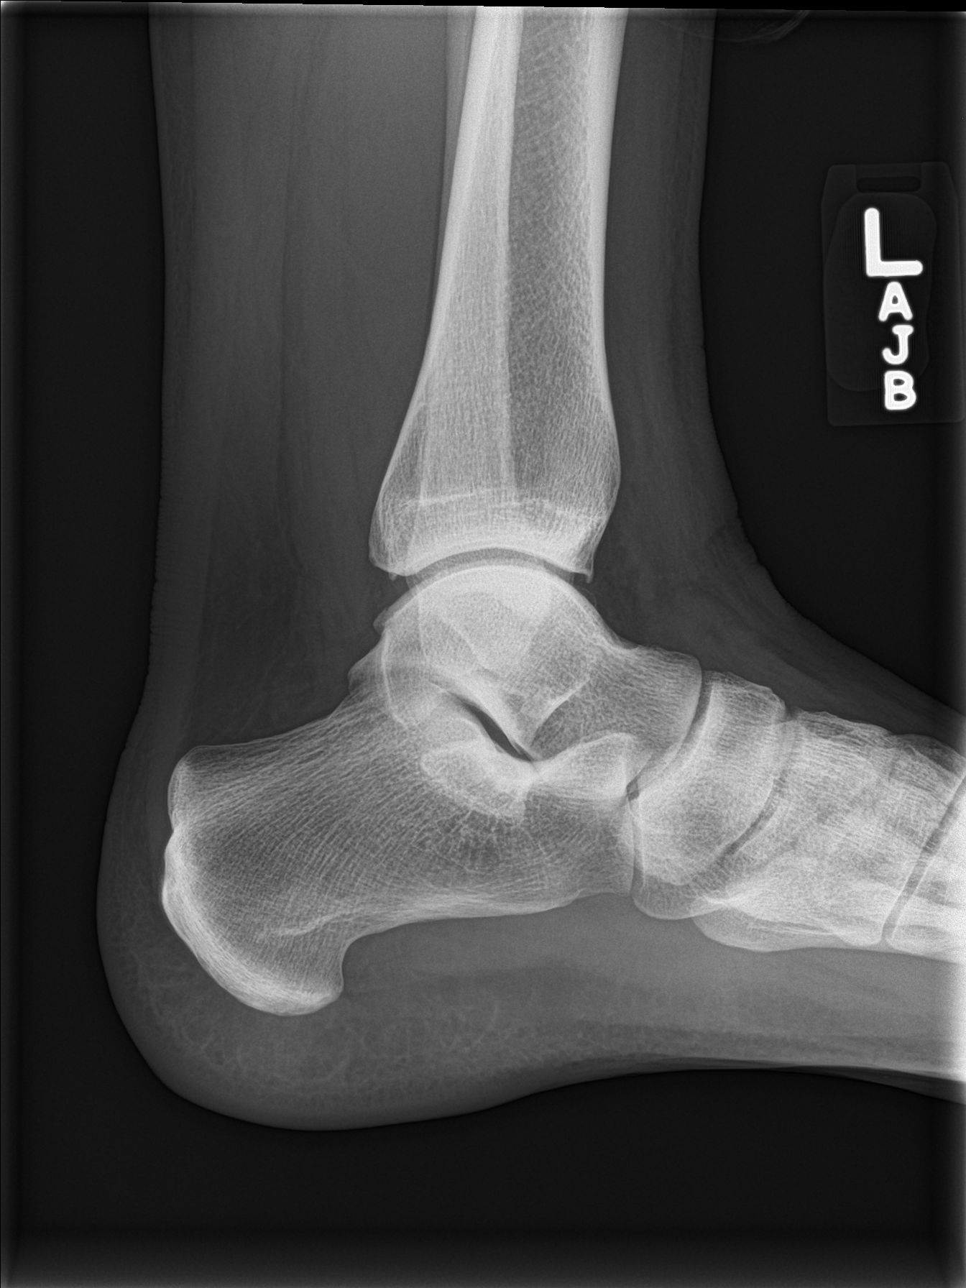

[4 of 4 positions shown; findings below may reference images not displayed]

FINDINGS: Mild degenerative spurring at the tibiotalar articulation. Subtle
linear lucency posteriorly along the talar dome is more likely
degenerative than posttraumatic, and no corresponding abnormality
was visible on 01/20/2017. No significant soft tissue swelling or
peritubal acute bony abnormality.
IMPRESSION: 1. Mild degenerative findings at the tibiotalar articulation,
otherwise unremarkable radiographic appearance.

## 2018-03-04 ENCOUNTER — Telehealth: Payer: 59 | Admitting: Family

## 2018-03-04 DIAGNOSIS — W57XXXA Bitten or stung by nonvenomous insect and other nonvenomous arthropods, initial encounter: Secondary | ICD-10-CM

## 2018-03-04 DIAGNOSIS — S0086XA Insect bite (nonvenomous) of other part of head, initial encounter: Secondary | ICD-10-CM

## 2018-03-04 MED ORDER — DOXYCYCLINE HYCLATE 100 MG PO TABS: 100.0000 mg | ORAL_TABLET | Freq: Two times a day (BID) | ORAL | 0 refills | Status: DC

## 2018-03-04 NOTE — Progress Notes (Signed)
Thank you for the details you included in the comment boxes. Those details are very helpful in determining the best course of treatment for you and help us to provide the best care.  Thank you for describing your tick bite, Here is how we plan to help! Based on the information that you shared with me it looks like you have An infected or complicated tick bite that requires a longer course of antibiotics and will need for you to schedule a follow-up visit with a provider.  In most cases a tick bite is painless and does not itch.  Most tick bites in which the tick is quickly removed do not require prescriptions. Ticks can transmit several diseases if they are infected and remain attacked to your skin. Therefore the length that the tick was attached and any symptoms you have experienced after the bite are import to accurately develop your custom treatment plan. In most cases a single dose of doxycycline may prevent the development of a more serious condition.  Based on your information I have Provided a home care guide for tick bites  and  instructions on when to call for help. and Your symptoms indicate that you need a longer course of antibiotics and a follow up visit with a provider. I have sent doxycycline 100 mg twice a day for 21 days to the pharmacy that you selected. You will need to schedule a follow up visit with your provider. If you do not have a primary care provider you may use our telehealth physicians on the web at [facility].  Which ticks  are associated with illness?  The Wood Tick (dog tick) is the size of a watermelon seed and can sometimes transmit Rocky Mountain spotted fever and [place] tick fever.   The Deer Tick (black-legged tick) is between the size of a poppy seed (pin head) and an apple seed, and can sometimes transmit Lyme disease.  A brown to black tick with a white splotch on its back is likely a male Amblyomma americanum (Lone Star tick). This tick has been  associated with Southern Tick Associated illness ( STARI)  Lyme disease has become the most common tick-borne illness in the [place]. The risk of Lyme disease following a recognized deer tick bite is estimated to be 1%.  The majority of cases of Lyme disease start with a bull's eye rash at the site of the tick bite. The rash can occur days to weeks (typically 7-10 days) after a tick bite. Treatment with antibiotics is indicated if this rash appears. Flu-like symptoms may accompany the rash, including: fever, chills, headaches, muscle aches, and fatigue. Removing ticks promptly may prevent tick borne disease.  What can be used to prevent Tick Bites?   Insect repellant with at leas 20% DEET.  Wearing long pants with sock and shoes.  Avoiding tall grass and heavily wooded areas.  Checking your skin after being outdoors.  Shower with a washcloth after outdoor exposures.  HOME CARE ADVICE FOR TICK BITE  1. Wood Tick Removal:  o Use a pair of tweezers and grasp the wood tick close to the skin (on its head). Pull the wood tick straight upward without twisting or crushing it. Maintain a steady pressure until it releases its grip.   o If tweezers aren't available, use fingers, a loop of thread around the jaws, or a needle between the jaws for traction.  o Note: covering the tick with petroleum jelly, nail polish or rubbing alcohol doesn't work.   Neither does touching the tick with a hot or cold object. 2. Tiny Deer Tick Removal:   o Needs to be scraped off with a knife blade or credit card edge. o Place tick in a sealed container (e.g. glass jar, zip lock plastic bag), in case your doctor wants to see it. 3. Tick's Head Removal:  o If the wood tick's head breaks off in the skin, it must be removed. Clean the skin. Then use a sterile needle to uncover the head and lift it out or scrape it off.  o If a very small piece of the head remains, the skin will eventually slough it  off. 4. Antibiotic Ointment:  o Wash the wound and your hands with soap and water after removal to prevent catching any tick disease.  Apply an over the counter antibiotic ointment (e.g. bacitracin) to the bite once. 5. Expected Course: Tick bites normally don't itch or hurt. That's why they often go unnoticed. 6. Call Your Doctor If:  o You can't remove the tick or the tick's head o Fever, a severe head ache, or rash occur in the next 2 weeks o Bite begins to look infected o Lyme's disease is common in your area o You have not had a tetanus in the last 10 years o Your current symptoms become worse    MAKE SURE YOU   Understand these instructions.  Will watch your condition.  Will get help right away if you are not doing well or get worse.   Thank you for choosing an e-visit.  Your e-visit answers were reviewed by a board certified advanced clinical practitioner to complete your personal care plan. Depending upon the condition, your plan could have included both over the counter or prescription medications. Please review your pharmacy choice. If there is a problem you may use MyChart messaging to have the prescription routed to another pharmacy. Your safety is important to us. If you have drug allergies check your prescription carefully.   You can use MyChart to ask questions about today's visit, request a non-urgent call back, or ask for a work or school excuse for 24 hours related to this e-Visit. If it has been greater than 24 hours you will need to follow up with your provider, or enter a new e-Visit to address those concerns.  You will get an email in the next two days asking about your experience. I hope  that your e-visit has been valuable and will speed your recovery  

## 2018-03-07 ENCOUNTER — Ambulatory Visit (INDEPENDENT_AMBULATORY_CARE_PROVIDER_SITE_OTHER): Payer: 59 | Admitting: Family Medicine

## 2018-03-07 ENCOUNTER — Encounter: Payer: Self-pay | Admitting: Family Medicine

## 2018-03-07 VITALS — BP 150/91 | HR 82 | Temp 98.7°F | Resp 16 | Ht 76.0 in | Wt 279.8 lb

## 2018-03-07 DIAGNOSIS — T7840XA Allergy, unspecified, initial encounter: Secondary | ICD-10-CM

## 2018-03-07 DIAGNOSIS — S0096XA Insect bite (nonvenomous) of unspecified part of head, initial encounter: Secondary | ICD-10-CM | POA: Diagnosis not present

## 2018-03-07 DIAGNOSIS — W57XXXA Bitten or stung by nonvenomous insect and other nonvenomous arthropods, initial encounter: Secondary | ICD-10-CM

## 2018-03-07 DIAGNOSIS — Z91030 Bee allergy status: Secondary | ICD-10-CM

## 2018-03-07 MED ORDER — EPINEPHRINE 0.3 MG/0.3ML IJ SOAJ: 0.3000 mg | Freq: Once | INTRAMUSCULAR | 0 refills | Status: AC

## 2018-03-07 MED ORDER — PREDNISONE 20 MG PO TABS: ORAL_TABLET | ORAL | 0 refills | Status: DC

## 2018-03-07 NOTE — Progress Notes (Signed)
Subjective:    Patient ID: NYREE YONKER, male    DOB: [birth year], 42 y.o.   MRN: [id number]  RICHERD GRIME is a 42 y.o. male presenting on 03/07/2018 for Tick Removal (onset 3 days Doxycycline were Rx thro E-VISIT not improving his Sxs SOB, facial numbness, tingling in body)  Previously not followed by regular PCP before, he did Urgent Care or E-Visits. Now here for acute problem, but has upcoming establish care visit on 03/25/18  Patient presents for a same day appointment.  HPI   TICK BITE / Facial Numbness / Dyspnea Reports now new tick bite now 4 days ago initial new tick bite on lip, tick was found in his beard, thinks he identified it as a Lone Star Tick, while out in woods, was on for < 24 hours, he had a localized area on face that improved with topical cortisone cream, otherwise no extending rash or redness or bulls eye or drainage. He was treated through E-Visit with rx Doxycycline 100mg  BID for 21 days, currently on day 3, with worsening and not improvement. He has bilateral facial numbness and dyspnea, he was advised to f/u with primary doctor but did not have one and next apt is not until 03/25/18 with me for new patient - He is asking about possibility of tick bite allergy, he has history of bee sting allergy, request new refill EpiPen - Prior problem about 1 year ago he had an encounter with "baby ticks" multiple tick bites he said he had >100 of them on him, he was treated promptly with Doxycycline, and he had some dyspnea and his symptoms similar to this lasted up to 6 months with gradual resolution - Also current symptoms he describes feel similar to other food allergies that he has, and he was tested by [facility] in past, not seen other allergist - Not taking anti histamine or claritin - He has taken Prednisone in past with relief of similar symptoms - Denies any fevers, chills, nausea vomiting headache, extending rash chest pain or tightness, lip or facial swelling, weakness      Depression screen PHQ 2/9 03/07/2018  Decreased Interest 0  Down, Depressed, Hopeless 0  PHQ - 2 Score 0    Social History   Tobacco Use  . Smoking status: Never Smoker  . Smokeless tobacco: Never Used  Substance Use Topics  . Alcohol use: Yes    Comment: social  . Drug use: No    Review of Systems Per HPI unless specifically indicated above     Objective:    BP (!) 150/91   Pulse 82   Temp 98.7 F (37.1 C) (Oral)   Resp 16   Ht 6\' 4"  (1.93 m)   Wt 279 lb 12.8 oz (126.9 kg)   SpO2 97%   BMI 34.06 kg/m   Wt Readings from Last 3 Encounters:  03/07/18 279 lb 12.8 oz (126.9 kg)  08/10/17 260 lb (117.9 kg)  07/08/17 260 lb (117.9 kg)    Physical Exam  Constitutional: He is oriented to person, place, and time. He appears well-developed and well-nourished. No distress.  Well-appearing, comfortable, cooperative  HENT:  Head: Normocephalic and atraumatic.  Mouth/Throat: Oropharynx is clear and moist.  Frontal / maxillary sinuses non-tender. Nares patent without purulence or edema. Bilateral TMs clear without erythema, effusion or bulging. Oropharynx clear without erythema, exudates, edema or asymmetry.  Eyes: Conjunctivae are normal. Right eye exhibits no discharge. Left eye exhibits no discharge.  Neck: Normal  range of motion. Neck supple.  Cardiovascular: Normal rate, regular rhythm, normal heart sounds and intact distal pulses.  No murmur heard. Pulmonary/Chest: Effort normal and breath sounds normal. No respiratory distress. He has no wheezes. He has no rales.  Musculoskeletal: Normal range of motion. He exhibits no edema.  Muscle strength intact and symmetrical bilateral extremities  Lymphadenopathy:    He has no cervical adenopathy.  Neurological: He is alert and oriented to person, place, and time. No cranial nerve deficit or sensory deficit (Has intact sensation to light touch bilateral face cheek and lower).  Skin: Skin is warm and dry. No rash noted. He is  not diaphoretic. No erythema.  Chin - Localized 1 cm area of mild erythema and induration seems to be healing, site of tick bite, without evidence of extending erythema, non tender  Psychiatric: He has a normal mood and affect. His behavior is normal.  Well groomed, good eye contact, normal speech and thoughts  Nursing note and vitals reviewed.      Assessment & Plan:   Problem List Items Addressed This Visit    None    Visit Diagnoses    Tick bite of head, initial encounter    -  Primary   Allergic reaction, initial encounter       Relevant Medications   predniSONE (DELTASONE) 20 MG tablet   History of bee sting allergy       Relevant Medications   EPINEPHrine 0.3 mg/0.3 mL IJ SOAJ injection      Clinically uncertain exact etiology of his current constellation of symptoms, given time course and not characteristic with tick borne illness (also reassuring short duration of tick and no rash) suspect may be more allergic reaction to the tick bite itself given prior exposure year ago with similar episode. Other allergies bee sting and food allergies, atopic history. Additionally considered Bell's Palsy but he has bilateral lower only facial "numbness" however has sensation on exam but seems more of a subjective symptom also Bell's Palsy is unilateral only not bilateral and involves upper face  Plan - Discussion on diagnosis and management, challenging given he is new patient to me today, never had chance to establish care on other background history - Agree with current Doxycycline empiric course 100mg  BID x 21 days (has 18 days to finish) - Add Prednisone 7 day taper given concern for allergic reaction symptoms, primarily dyspnea, despite clear lungs and no other focal sign of infection or problem on exam - Add OTC anti histamine loratadine daily - If not improving given strict return criteria when to return to our office vs hospital, and may in future need referral Allergy & Immunology  for further testing - Additoinally offered tick lyme disease ab burgdorfi and can check other tickborne illnesses if not improving, agree to defer for now since < 1 week and already on appropriate treatment   Meds ordered this encounter  Medications  . predniSONE (DELTASONE) 20 MG tablet    Sig: Take daily with food. Start with 60mg  (3 pills) x 2 days, then reduce to 40mg  (2 pills) x 2 days, then 20mg  (1 pill) x 3 days    Dispense:  13 tablet    Refill:  0  . EPINEPHrine 0.3 mg/0.3 mL IJ SOAJ injection    Sig: Inject 0.3 mLs (0.3 mg total) into the muscle once for 1 dose.    Dispense:  1 Device    Refill:  0    Follow up plan: Return in  about 1 week (around 03/14/2018), or if symptoms worsen or fail to improve, for as needed 1 week if not improved tick bite.  Nobie Putnam, [facility] 03/07/2018, 8:25 PM

## 2018-03-07 NOTE — Patient Instructions (Addendum)
Thank you for coming to the office today.  Does not seem consistent with Lyme or RMSF or Bell's Palsy - but cannot completely rule out unless lab test is done  Finish Doxycycline 100mg  twice daily for 21 total days  Start Prednisone taper over 7 days, if 100% better by day 4-5 may stop.  Go ahead and add in Claritin 10mg  daily for additional allergy blockade for now  In future we may need to refer you to Allergy and Immunology specialist if not improving - may need other advanced testing, possibly not done at ENT  If any dramatic worsening numbness, tingling, weakness, shortness of breath, worsening fatigue, fever, and rash then notify office or seek more immediate therapy at urgent care or hospital, we may need further treatment and testing  Please schedule a Follow-up Appointment to: Return in about 1 week (around 03/14/2018), or if symptoms worsen or fail to improve, for as needed 1 week if not improved tick bite.  If you have any other questions or concerns, please feel free to call the office or send a message through [facility]. You may also schedule an earlier appointment if necessary.  Additionally, you may be receiving a survey about your experience at our office within a few days to 1 week by e-mail or mail. We value your feedback.  Tanner Putnam, DO [place]

## 2018-03-25 ENCOUNTER — Ambulatory Visit (INDEPENDENT_AMBULATORY_CARE_PROVIDER_SITE_OTHER): Payer: 59 | Admitting: Family Medicine

## 2018-03-25 ENCOUNTER — Encounter: Payer: Self-pay | Admitting: Family Medicine

## 2018-03-25 VITALS — BP 126/80 | HR 61 | Temp 98.4°F | Resp 16 | Ht 76.0 in | Wt 278.0 lb

## 2018-03-25 DIAGNOSIS — M76821 Posterior tibial tendinitis, right leg: Secondary | ICD-10-CM | POA: Diagnosis not present

## 2018-03-25 DIAGNOSIS — M2142 Flat foot [pes planus] (acquired), left foot: Secondary | ICD-10-CM

## 2018-03-25 DIAGNOSIS — E669 Obesity, unspecified: Secondary | ICD-10-CM

## 2018-03-25 DIAGNOSIS — Z0000 Encounter for general adult medical examination without abnormal findings: Secondary | ICD-10-CM

## 2018-03-25 DIAGNOSIS — M2141 Flat foot [pes planus] (acquired), right foot: Secondary | ICD-10-CM

## 2018-03-25 DIAGNOSIS — R030 Elevated blood-pressure reading, without diagnosis of hypertension: Secondary | ICD-10-CM | POA: Diagnosis not present

## 2018-03-25 DIAGNOSIS — M76822 Posterior tibial tendinitis, left leg: Secondary | ICD-10-CM | POA: Diagnosis not present

## 2018-03-25 DIAGNOSIS — R7309 Other abnormal glucose: Secondary | ICD-10-CM

## 2018-03-25 DIAGNOSIS — Z6829 Body mass index (BMI) 29.0-29.9, adult: Secondary | ICD-10-CM | POA: Insufficient documentation

## 2018-03-25 NOTE — Assessment & Plan Note (Signed)
Abnormal weight gain >10+ lbs in 8 months, attributed to poor lifestyle diet/exercise, had been more limited due to chronic foot pain - Encourage improve diet, reviewed options, handout given - Improve regular exercise now feet improved, avoid high impact exercises that can re-aggravate this problem

## 2018-03-25 NOTE — Patient Instructions (Addendum)
Thank you for coming to the office today.  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[place]   Address: [address] [place], [place], [place] [address] Phone: [phone]  ------------------------------------------------------------------ WEIGHT MANAGEMENT PROGRAM  Dr Dennard Nip  [facility] [place], [place] [address] Ph: [phone]  DUE for FASTING BLOOD WORK (no food or drink after midnight before the lab appointment, only water or coffee without cream/sugar on the morning of)  TODAY  For Lab Results, once available within 2-3 days of blood draw, you can can log in to MyChart online to view your results and a brief explanation. Also, we can discuss results at next follow-up visit.   Please schedule a Follow-up Appointment to: Return in about 1 year (around 03/26/2019) for Annual Physical.  If you have any other questions or concerns, please feel free to call the office or send a message through [place]. You may also schedule an earlier appointment if necessary.  Additionally, you may be receiving a survey about your experience at our office within a few days to 1 week by e-mail or mail. We value your feedback.  Tanner Putnam, DO [place]

## 2018-03-25 NOTE — Assessment & Plan Note (Addendum)
Factor contributing to chronic b/l foot pain and dependence on posterior tibial / peroneal tendons inciting tendonitis - Improved with orthotic

## 2018-03-25 NOTE — Assessment & Plan Note (Signed)
Gradually improved chronic bilateral foot/ankle pain WIthout recent flare or injury Prior dx posterior tibial / peroneal tendonitis, less likely plantar fasciitis based on Sports Med eval/exam and MRI last done 11/2016 - Previous followed by [facility] Dr Micheline Chapman - Prior completed PT at [facility] with improvement  Plan - Continue current plan with PT home exercises, avoid aggravating or overuse injury - RICE therapy - Continue NSAID Naproxen - but now switch to more PRN basis, may use more regular Tylenol - Continue using inserts/insoles - Follow-up sooner if need, can return to [facility] vs PT in future if need

## 2018-03-25 NOTE — Progress Notes (Signed)
Subjective:    Patient ID: Tanner Frye, male    DOB: [birth year], 42 y.o.   MRN: [id number]  Tanner Frye is a 42 y.o. male presenting on 03/25/2018 for Annual Exam   HPI   Here for Annual Physical and he is fasting today for annual labs.  Follow-up Tick Bite, - Last visit 03/07/18, treated for tick bite on face with history of allergic reaction in past, he was already on doxycycline has not finished yet as it was for 21 days, he was given Prednisone taper by me for 7 days finished entire course with improvement, and added Claritin. Review prior note for background information, he has had prior allergic reaction to similar tick bite before and had lasting fatigue and tiredness. - Now today he has finished prednisone >1 week ago, and overall has improved significantly  History of Elevated Glucose / Obesity BMI >33 - Recent weight trend increased from 260 lbs up to about 270s to 278 lbs now, no clear reason for this, but admits he needs to lose weight gain. - Prior lab result in 2018 with hyperglycemia. No prior dx of PreDM or Diabetes - Lifestyle - Diet: Admits he needs to cut carbs out diet, drinking mostly water - Exercise: No regular exercise. He has home gym and pool. He uses recumbent bike to avoid pressure on foot.  History of Posterior Tibialis Tendonitis, bilateral L worse R Followed by Dr Micheline Chapman in past and he was referred to Physical therapy with good results, focus on stretching L calf muscle improving mechanics of feet, also he was given shoe insert with good results, gradual improvement process, in past he saw Podiatry with limited results Today reports it is gradually improving Taking Naproxen 500mg  BID most days over past few months with relief Not taking Tylenol reuglarly   Health Maintenance:  Reported prior history of routine HIV screening 3+ years ago, had case of thrush at the time. Test was reported negative, do not have copy of result.  Colon CA Screening: Last  Colonoscopy (done on 04/27/17 Dr Vicente Males AGI), results with internal hemorrhoids (diagnostic for bleeding) and found 1 polyp, path result tubular adenoma, good for 5 years (2023 approx). Currently asymptomatic. Known family history of colon CA in more remote relatives, aunts and uncles on father side age >17-70+.  Not indicated for prostate cancer screening.  Depression screen [facility] 2/9 03/25/2018 03/07/2018  Decreased Interest 0 0  Down, Depressed, Hopeless 0 0  PHQ - 2 Score 0 0    Past Medical History:  Diagnosis Date  . Anal fissure   . GERD (gastroesophageal reflux disease)    Past Surgical History:  Procedure Laterality Date  . COLONOSCOPY WITH PROPOFOL N/A 04/27/2017   Procedure: COLONOSCOPY WITH PROPOFOL;  Surgeon: Jonathon Bellows, MD;  Location: [facility] ENDOSCOPY;  Service: Endoscopy;  Laterality: N/A;  . HERNIA REPAIR  1980   Social History   Socioeconomic History  . Marital status: Married    Spouse name: Not on file  . Number of children: Not on file  . Years of education: Vocational Degree  . Highest education level: Associate degree: occupational, Hotel manager, or vocational program  Occupational History  . Not on file  Social Needs  . Financial resource strain: Not on file  . Food insecurity:    Worry: Not on file    Inability: Not on file  . Transportation needs:    Medical: Not on file    Non-medical: Not on file  Tobacco Use  .  Smoking status: Never Smoker  . Smokeless tobacco: Never Used  Substance and Sexual Activity  . Alcohol use: Yes    Alcohol/week: 1.2 oz    Types: 2 Cans of beer per week    Comment: social  . Drug use: No  . Sexual activity: Yes    Partners: Female  Lifestyle  . Physical activity:    Days per week: Not on file    Minutes per session: Not on file  . Stress: Not on file  Relationships  . Social connections:    Talks on phone: Not on file    Gets together: Not on file    Attends religious service: Not on file    Active member of club or  organization: Not on file    Attends meetings of clubs or organizations: Not on file    Relationship status: Not on file  . Intimate partner violence:    Fear of current or ex partner: Not on file    Emotionally abused: Not on file    Physically abused: Not on file    Forced sexual activity: Not on file  Other Topics Concern  . Not on file  Social History Narrative  . Not on file   Family History  Problem Relation Age of Onset  . Hypertension Mother   . Hyperlipidemia Mother   . Colon cancer Paternal Aunt   . Colon cancer Paternal Uncle   . Prostate cancer Neg Hx    Current Outpatient Medications on File Prior to Visit  Medication Sig  . doxycycline (VIBRA-TABS) 100 MG tablet Take 1 tablet (100 mg total) by mouth 2 (two) times daily.  Marland Kitchen EPIPEN 2-PAK 0.3 MG/0.3ML SOAJ injection INJ 0.3 ML IM ONCE FOR 1 DOSE  . ibuprofen (ADVIL,MOTRIN) 100 MG tablet Take 100 mg by mouth every 6 (six) hours as needed for fever.  . Multiple Vitamin (MULTIVITAMIN) tablet Take 1 tablet by mouth daily.  . naproxen (NAPROSYN) 500 MG tablet TAKE 1 TABLET BY MOUTH TWICE DAILY FOR 5 DAYS, THEN MAY USE TWICE DAILY AS NEEDED AFTER THAT  . Omega-3 Fatty Acids (FISH OIL PO) Take by mouth.  Marland Kitchen omeprazole (PRILOSEC) 20 MG capsule Take 20 mg by mouth daily.   No current facility-administered medications on file prior to visit.     Review of Systems  Constitutional: Negative for activity change, appetite change, chills, diaphoresis, fatigue and fever.  HENT: Negative for congestion and hearing loss.   Eyes: Negative for visual disturbance.  Respiratory: Negative for apnea, cough, choking, chest tightness, shortness of breath and wheezing.        Snoring   Cardiovascular: Negative for chest pain, palpitations and leg swelling.  Gastrointestinal: Negative for abdominal pain, anal bleeding, blood in stool, constipation, diarrhea, nausea and vomiting.  Endocrine: Negative for cold intolerance.  Genitourinary:  Negative for difficulty urinating, dysuria, frequency and hematuria.  Musculoskeletal: Negative for arthralgias, back pain and neck pain.       Foot pain, improved now  Skin: Negative for rash.  Allergic/Immunologic: Negative for environmental allergies.  Neurological: Negative for dizziness, weakness, light-headedness, numbness and headaches.  Hematological: Negative for adenopathy.  Psychiatric/Behavioral: Negative for behavioral problems, dysphoric mood and sleep disturbance. The patient is not nervous/anxious.    Per HPI unless specifically indicated above     Objective:    BP 126/80 (BP Location: Left Arm, Cuff Size: Normal)   Pulse 61   Temp 98.4 F (36.9 C) (Oral)   Resp 16  Ht 6\' 4"  (1.93 m)   Wt 278 lb (126.1 kg)   BMI 33.84 kg/m   Wt Readings from Last 3 Encounters:  03/25/18 278 lb (126.1 kg)  03/07/18 279 lb 12.8 oz (126.9 kg)  08/10/17 260 lb (117.9 kg)    Physical Exam  Constitutional: He is oriented to person, place, and time. He appears well-developed and well-nourished. No distress.  Well-appearing, comfortable, cooperative, obese but has a larger body frame with muscle mass as well  HENT:  Head: Normocephalic and atraumatic.  Mouth/Throat: Oropharynx is clear and moist.  Frontal / maxillary sinuses non-tender. Nares patent without purulence or edema. Bilateral TMs clear without erythema, effusion or bulging. Oropharynx clear without erythema, exudates, edema or asymmetry.  Eyes: Pupils are equal, round, and reactive to light. Conjunctivae and EOM are normal. Right eye exhibits no discharge. Left eye exhibits no discharge.  Neck: Normal range of motion. Neck supple. No thyromegaly present.  Cardiovascular: Normal rate, regular rhythm, normal heart sounds and intact distal pulses.  No murmur heard. Pulmonary/Chest: Effort normal and breath sounds normal. No respiratory distress. He has no wheezes. He has no rales.  Abdominal: Soft. Bowel sounds are normal. He  exhibits no distension and no mass. There is no tenderness.  Musculoskeletal: Normal range of motion. He exhibits no edema or tenderness.  Upper / Lower Extremities: - Normal muscle tone, strength bilateral upper extremities 5/5, lower extremities 5/5  Lymphadenopathy:    He has no cervical adenopathy.  Neurological: He is alert and oriented to person, place, and time.  Distal sensation intact to light touch all extremities  Skin: Skin is warm and dry. No rash noted. He is not diaphoretic. No erythema.  Previous bug vs tick bite on chin has resolved  Psychiatric: He has a normal mood and affect. His behavior is normal.  Well groomed, good eye contact, normal speech and thoughts  Nursing note and vitals reviewed.    Results for orders placed or performed in visit on 03/25/18  CBC with Differential/Platelet  Result Value Ref Range   WBC 10.1 3.8 - 10.8 Thousand/uL   RBC 5.38 4.20 - 5.80 Million/uL   Hemoglobin 15.6 13.2 - 17.1 g/dL   HCT 45.4 38.5 - 50.0 %   MCV 84.4 80.0 - 100.0 fL   MCH 29.0 27.0 - 33.0 pg   MCHC 34.4 32.0 - 36.0 g/dL   RDW 12.6 11.0 - 15.0 %   Platelets 269 140 - 400 Thousand/uL   MPV 10.1 7.5 - 12.5 fL   Neutro Abs 5,919 1,500 - 7,800 cells/uL   Lymphs Abs 3,091 850 - 3,900 cells/uL   WBC mixed population 889 200 - 950 cells/uL   Eosinophils Absolute 131 15 - 500 cells/uL   Basophils Absolute 71 0 - 200 cells/uL   Neutrophils Relative % 58.6 %   Total Lymphocyte 30.6 %   Monocytes Relative 8.8 %   Eosinophils Relative 1.3 %   Basophils Relative 0.7 %      Assessment & Plan:   Problem List Items Addressed This Visit    Elevated BP without diagnosis of hypertension    Mildly elevated initial BP, repeat manual check improved to normal range. - Home BP readings limited but prior somewhat elevated  No known complications    Plan:  1. Remain off treatment, not dx HTN 2. Encourage improved lifestyle - low sodium diet, restart regular exercise 3. Start  monitor BP outside office, bring readings to next visit, if persistently >140/90 or  new symptoms notify office sooner 4. Follow-up 1 yr or sooner if need       Obesity (BMI 30.0-34.9)    Abnormal weight gain >10+ lbs in 8 months, attributed to poor lifestyle diet/exercise, had been more limited due to chronic foot pain - Encourage improve diet, reviewed options, handout given - Improve regular exercise now feet improved, avoid high impact exercises that can re-aggravate this problem      Relevant Orders   COMPLETE METABOLIC PANEL WITH GFR   Lipid panel   Pes planus    Factor contributing to chronic b/l foot pain and dependence on posterior tibial / peroneal tendons inciting tendonitis - Improved with orthotic      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    Gradually improved chronic bilateral foot/ankle pain WIthout recent flare or injury Prior dx posterior tibial / peroneal tendonitis, less likely plantar fasciitis based on Sports Med eval/exam and MRI last done 11/2016 - Previous followed by [facility] Dr Micheline Chapman - Prior completed PT at [facility] with improvement  Plan - Continue current plan with PT home exercises, avoid aggravating or overuse injury - RICE therapy - Continue NSAID Naproxen - but now switch to more PRN basis, may use more regular Tylenol - Continue using inserts/insoles - Follow-up sooner if need, can return to [facility] vs PT in future if need       Other Visit Diagnoses    Annual physical exam    -  Primary Updated health maintenance Reviewed due labs - ordered fasting labs Encourage improve healthy lifestyle diet and exercise wt loss goals    Relevant Orders   Hemoglobin A1c   CBC with Differential/Platelet (Completed)   COMPLETE METABOLIC PANEL WITH GFR   Lipid panel   Abnormal glucose   Concern for impaired fasting glucose or hyperglycemia F/u result, may return sooner if PreDM range      Relevant Orders   Hemoglobin A1c      #Tick Bite, face  / Allergic reaction Resolving now, completed prednisone, still has some residual fatigue but it is improved Finishing current doxycycline 21 day course  No orders of the defined types were placed in this encounter.   Follow up plan: Return in about 1 year (around 03/26/2019) for Annual Physical.  Future labs to be ordered for 03/21/19 - pending review of current results.  Nobie Putnam, [facility] 03/25/2018, 10:06 PM

## 2018-03-25 NOTE — Assessment & Plan Note (Signed)
Mildly elevated initial BP, repeat manual check improved to normal range. - Home BP readings limited but prior somewhat elevated  No known complications    Plan:  1. Remain off treatment, not dx HTN 2. Encourage improved lifestyle - low sodium diet, restart regular exercise 3. Start monitor BP outside office, bring readings to next visit, if persistently >140/90 or new symptoms notify office sooner 4. Follow-up 1 yr or sooner if need

## 2018-03-26 LAB — COMPLETE METABOLIC PANEL WITH GFR
AG Ratio: 1.8 (calc) (ref 1.0–2.5)
ALT: 72 U/L — ABNORMAL HIGH (ref 9–46)
AST: 54 U/L — ABNORMAL HIGH (ref 10–40)
Albumin: 4.6 g/dL (ref 3.6–5.1)
Alkaline phosphatase (APISO): 45 U/L (ref 40–115)
BUN: 13 mg/dL (ref 7–25)
CO2: 28 mmol/L (ref 20–32)
Calcium: 10 mg/dL (ref 8.6–10.3)
Chloride: 100 mmol/L (ref 98–110)
Creat: 0.96 mg/dL (ref 0.60–1.35)
GFR, Est African American: 113 mL/min/{1.73_m2} (ref >=60)
GFR, Est Non African American: 98 mL/min/{1.73_m2} (ref >=60)
Globulin: 2.6 g/dL (calc) (ref 1.9–3.7)
Glucose, Bld: 94 mg/dL (ref 65–99)
Potassium: 4.7 mmol/L (ref 3.5–5.3)
Sodium: 139 mmol/L (ref 135–146)
Total Bilirubin: 1 mg/dL (ref 0.2–1.2)
Total Protein: 7.2 g/dL (ref 6.1–8.1)

## 2018-03-26 LAB — LIPID PANEL
Cholesterol: 253 mg/dL — ABNORMAL HIGH (ref <200)
HDL: 45 mg/dL (ref >40)
LDL Cholesterol (Calc): 154 mg/dL (calc) — ABNORMAL HIGH
Non-HDL Cholesterol (Calc): 208 mg/dL (calc) — ABNORMAL HIGH (ref <130)
Total CHOL/HDL Ratio: 5.6 (calc) — ABNORMAL HIGH (ref <5.0)
Triglycerides: 351 mg/dL — ABNORMAL HIGH (ref <150)

## 2018-03-26 LAB — CBC WITH DIFFERENTIAL/PLATELET
Basophils Absolute: 71 cells/uL (ref 0–200)
Basophils Relative: 0.7 %
Eosinophils Absolute: 131 cells/uL (ref 15–500)
Eosinophils Relative: 1.3 %
HCT: 45.4 % (ref 38.5–50.0)
Hemoglobin: 15.6 g/dL (ref 13.2–17.1)
Lymphs Abs: 3091 cells/uL (ref 850–3900)
MCH: 29 pg (ref 27.0–33.0)
MCHC: 34.4 g/dL (ref 32.0–36.0)
MCV: 84.4 fL (ref 80.0–100.0)
MPV: 10.1 fL (ref 7.5–12.5)
Monocytes Relative: 8.8 %
Neutro Abs: 5919 cells/uL (ref 1500–7800)
Neutrophils Relative %: 58.6 %
Platelets: 269 10*3/uL (ref 140–400)
RBC: 5.38 10*6/uL (ref 4.20–5.80)
RDW: 12.6 % (ref 11.0–15.0)
Total Lymphocyte: 30.6 %
WBC mixed population: 889 cells/uL (ref 200–950)
WBC: 10.1 10*3/uL (ref 3.8–10.8)

## 2018-03-26 LAB — HEMOGLOBIN A1C
Hgb A1c MFr Bld: 6.2 % of total Hgb — ABNORMAL HIGH (ref <5.7)
Mean Plasma Glucose: 131 (calc)
eAG (mmol/L): 7.3 (calc)

## 2018-03-29 ENCOUNTER — Encounter: Payer: Self-pay | Admitting: Family Medicine

## 2018-03-29 DIAGNOSIS — R7989 Other specified abnormal findings of blood chemistry: Secondary | ICD-10-CM | POA: Insufficient documentation

## 2018-03-29 DIAGNOSIS — E782 Mixed hyperlipidemia: Secondary | ICD-10-CM | POA: Insufficient documentation

## 2018-03-29 DIAGNOSIS — R7309 Other abnormal glucose: Secondary | ICD-10-CM | POA: Insufficient documentation

## 2018-03-29 DIAGNOSIS — E1169 Type 2 diabetes mellitus with other specified complication: Secondary | ICD-10-CM | POA: Insufficient documentation

## 2018-03-29 DIAGNOSIS — E785 Hyperlipidemia, unspecified: Secondary | ICD-10-CM | POA: Insufficient documentation

## 2018-03-29 DIAGNOSIS — R945 Abnormal results of liver function studies: Secondary | ICD-10-CM

## 2018-07-08 ENCOUNTER — Telehealth: Payer: Self-pay | Admitting: Cardiovascular Disease

## 2018-07-08 NOTE — Telephone Encounter (Signed)
Received records request [employer], forwarded to [facility] for processing.

## 2018-07-20 ENCOUNTER — Other Ambulatory Visit: Payer: Self-pay | Admitting: *Deleted

## 2018-07-20 DIAGNOSIS — R918 Other nonspecific abnormal finding of lung field: Secondary | ICD-10-CM

## 2018-07-26 ENCOUNTER — Telehealth: Payer: 59 | Admitting: Nurse Practitioner

## 2018-07-26 DIAGNOSIS — R059 Cough, unspecified: Secondary | ICD-10-CM

## 2018-07-26 DIAGNOSIS — R05 Cough: Secondary | ICD-10-CM | POA: Diagnosis not present

## 2018-07-26 NOTE — Progress Notes (Signed)

## 2018-07-27 ENCOUNTER — Encounter: Payer: Self-pay | Admitting: Emergency Medicine

## 2018-07-27 ENCOUNTER — Other Ambulatory Visit: Payer: Self-pay

## 2018-07-27 ENCOUNTER — Ambulatory Visit
Admission: EM | Admit: 2018-07-27 | Discharge: 2018-07-27 | Disposition: A | Discharge status: Home / Self Care | Payer: 59 | Attending: Family Medicine | Admitting: Family Medicine

## 2018-07-27 DIAGNOSIS — B349 Viral infection, unspecified: Secondary | ICD-10-CM

## 2018-07-27 DIAGNOSIS — W57XXXA Bitten or stung by nonvenomous insect and other nonvenomous arthropods, initial encounter: Secondary | ICD-10-CM

## 2018-07-27 LAB — CBC WITH DIFFERENTIAL/PLATELET
Abs Immature Granulocytes: 0.04 10*3/uL (ref 0.00–0.07)
Basophils Absolute: 0.1 10*3/uL (ref 0.0–0.1)
Basophils Relative: 1 %
Eosinophils Absolute: 0.2 10*3/uL (ref 0.0–0.5)
Eosinophils Relative: 2 %
HCT: 44.4 % (ref 39.0–52.0)
Hemoglobin: 15.4 g/dL (ref 13.0–17.0)
Immature Granulocytes: 0 %
Lymphocytes Relative: 36 %
Lymphs Abs: 3.3 10*3/uL (ref 0.7–4.0)
MCH: 29 pg (ref 26.0–34.0)
MCHC: 34.7 g/dL (ref 30.0–36.0)
MCV: 83.6 fL (ref 80.0–100.0)
Monocytes Absolute: 0.7 10*3/uL (ref 0.1–1.0)
Monocytes Relative: 7 %
Neutro Abs: 5 10*3/uL (ref 1.7–7.7)
Neutrophils Relative %: 54 %
Platelets: 283 10*3/uL (ref 150–400)
RBC: 5.31 MIL/uL (ref 4.22–5.81)
RDW: 13 % (ref 11.5–15.5)
WBC: 9.3 10*3/uL (ref 4.0–10.5)
nRBC: 0 % (ref 0.0–0.2)

## 2018-07-27 LAB — COMPREHENSIVE METABOLIC PANEL
ALT: 83 U/L — ABNORMAL HIGH (ref 0–44)
AST: 54 U/L — ABNORMAL HIGH (ref 15–41)
Albumin: 4.5 g/dL (ref 3.5–5.0)
Alkaline Phosphatase: 44 U/L (ref 38–126)
Anion gap: 14 (ref 5–15)
BUN: 17 mg/dL (ref 6–20)
CO2: 24 mmol/L (ref 22–32)
Calcium: 9 mg/dL (ref 8.9–10.3)
Chloride: 101 mmol/L (ref 98–111)
Creatinine, Ser: 0.85 mg/dL (ref 0.61–1.24)
GFR calc Af Amer: >60 mL/min (ref >60)
GFR calc non Af Amer: >60 mL/min (ref >60)
Glucose, Bld: 97 mg/dL (ref 70–99)
Potassium: 3.9 mmol/L (ref 3.5–5.1)
Sodium: 139 mmol/L (ref 135–145)
Total Bilirubin: 1 mg/dL (ref 0.3–1.2)
Total Protein: 8.1 g/dL (ref 6.5–8.1)

## 2018-07-27 LAB — MONONUCLEOSIS SCREEN: Mono Screen: NEGATIVE

## 2018-07-27 MED ORDER — DOXYCYCLINE HYCLATE 100 MG PO TABS: 100.0000 mg | ORAL_TABLET | Freq: Two times a day (BID) | ORAL | 0 refills | Status: DC

## 2018-07-27 NOTE — Discharge Instructions (Signed)
Rest, fluids, tylenol/ibuprofen as needed Await lyme tests

## 2018-07-27 NOTE — ED Triage Notes (Signed)
Patient c/o fatigue, bodyaches that started on Sunday.  Patient denies any recent insect or tick bites

## 2018-07-27 NOTE — ED Provider Notes (Signed)
[facility]    CSN: [id number] Arrival date & time: 07/27/18  1300     History   Chief Complaint Chief Complaint  Patient presents with  . Fatigue  . Headache    HPI Tanner Frye is a 42 y.o. male.   42 yo male with a c/o fatigue and body aches for 4 days. Denies any other symptoms. Denies fevers, chills, cough, chest pains, shortness of breath, diarrhea.  States he's had similar symptoms in the past when he's been treated for Lyme disease. Patient states he may have been exposed to ticks recently although he didn't find a tick bite or embedded tick. Patient requesting an antibiotic for possible lyme disease.   The history is provided by the patient.    Past Medical History:  Diagnosis Date  . Anal fissure   . GERD (gastroesophageal reflux disease)     Patient Active Problem List   Diagnosis Date Noted  . Elevated hemoglobin A1c 03/29/2018  . Mixed hyperlipidemia 03/29/2018  . Elevated LFTs 03/29/2018  . Obesity (BMI 30.0-34.9) 03/25/2018  . 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 Pes planus 06/11/2017  . Abnormal gait 06/11/2017  . Elevated BP without diagnosis of hypertension 06/19/2014  . Family history of hyperlipidemia 06/19/2014    Past Surgical History:  Procedure Laterality Date  . COLONOSCOPY WITH PROPOFOL N/A 04/27/2017   Procedure: COLONOSCOPY WITH PROPOFOL;  Surgeon: Jonathon Bellows, MD;  Location: [facility] ENDOSCOPY;  Service: Endoscopy;  Laterality: N/A;  . [place] Medications    Prior to Admission medications   Medication Sig Start Date End Date Taking? Authorizing Provider  Multiple Vitamin (MULTIVITAMIN) tablet Take 1 tablet by mouth daily.   Yes [provider]  omeprazole (PRILOSEC) 20 MG capsule Take 20 mg by mouth daily.   Yes [provider]  doxycycline (VIBRA-TABS) 100 MG tablet Take 1 tablet (100 mg total) by mouth 2 (two) times daily. 07/27/18   Norval Gable, MD  EPIPEN 2-PAK 0.3 MG/0.3ML SOAJ injection INJ 0.3 ML IM ONCE FOR 1 DOSE 03/07/18   [provider]  ibuprofen (ADVIL,MOTRIN) 100 MG tablet Take 100 mg by mouth every 6 (six) hours as needed for fever.    [provider]  naproxen (NAPROSYN) 500 MG tablet TAKE 1 TABLET BY MOUTH TWICE DAILY FOR 5 DAYS, THEN MAY USE TWICE DAILY AS NEEDED AFTER THAT 08/08/17   McKeag, Marylynn Pearson, MD  Omega-3 Fatty Acids (FISH OIL PO) Take by mouth.    [provider]    Family History Family History  Problem Relation Age of Onset  . Hypertension Mother   . Hyperlipidemia Mother   . Colon cancer Paternal Aunt   . Colon cancer Paternal Uncle   . Prostate cancer Neg Hx     Social History Social History   Tobacco Use  . Smoking status: Never Smoker  . Smokeless tobacco: Never Used  Substance Use Topics  . Alcohol use: Yes    Alcohol/week: 2.0 standard drinks    Types: 2 Cans of beer per week    Comment: social  . Drug use: No     Allergies   Bee venom   Review of Systems Review of Systems   Physical Exam Triage Vital Signs ED Triage Vitals  Enc Vitals Group     BP 07/27/18 1312 (!) 143/102     Pulse Rate 07/27/18 1312 70  Resp 07/27/18 1312 16     Temp 07/27/18 1312 97.7 F (36.5 C)     Temp Source 07/27/18 1312 Oral     SpO2 07/27/18 1312 99 %     Weight 07/27/18 1310 280 lb (127 kg)     Height 07/27/18 1310 6\' 3"  (1.905 m)     Head Circumference --      Peak Flow --      Pain Score 07/27/18 1310 5     Pain Loc --      Pain Edu? --      Excl. in [place]? --    No data found.  Updated Vital Signs BP (!) 143/102 (BP Location: Left Arm)   Pulse 70   Temp 97.7 F (36.5 C) (Oral)   Resp 16   Ht 6\' 3"  (1.905 m)   Wt 127 kg   SpO2 99%   BMI 35.00 kg/m   Visual Acuity Right Eye Distance:   Left Eye Distance:   Bilateral Distance:    Right Eye Near:   Left Eye Near:    Bilateral Near:     Physical Exam  Constitutional: He appears  well-developed and well-nourished. No distress.  HENT:  Head: Normocephalic and atraumatic.  Nose: Nose normal.  Mouth/Throat: Uvula is midline, oropharynx is clear and moist and mucous membranes are normal. No oropharyngeal exudate, posterior oropharyngeal edema, posterior oropharyngeal erythema or tonsillar abscesses. No tonsillar exudate.  Eyes: Pupils are equal, round, and reactive to light. Conjunctivae and EOM are normal. Right eye exhibits no discharge. Left eye exhibits no discharge. No scleral icterus.  Neck: Normal range of motion. Neck supple. No tracheal deviation present. No thyromegaly present.  Cardiovascular: Normal rate, regular rhythm and normal heart sounds.  Pulmonary/Chest: Effort normal and breath sounds normal. No stridor. No respiratory distress. He has no wheezes. He has no rales. He exhibits no tenderness.  Lymphadenopathy:    He has no cervical adenopathy.  Neurological: He is alert.  Skin: Skin is warm and dry. No rash noted. He is not diaphoretic.  Nursing note and vitals reviewed.    UC Treatments / Results  Labs (all labs ordered are listed, but only abnormal results are displayed) Labs Reviewed  COMPREHENSIVE METABOLIC PANEL - Abnormal; Notable for the following components:      Result Value   AST 54 (*)    ALT 83 (*)    All other components within normal limits  CBC WITH DIFFERENTIAL/PLATELET  MONONUCLEOSIS SCREEN  B. BURGDORFI ANTIBODIES    EKG None  Radiology No results found.  Procedures Procedures (including critical care time)  Medications Ordered in UC Medications - No data to display  Initial Impression / Assessment and Plan / UC Course  I have reviewed the triage vital signs and the nursing notes.  Pertinent labs & imaging results that were available during my care of the patient were reviewed by me and considered in my medical decision making (see chart for details).      Final Clinical Impressions(s) / UC Diagnoses    Final diagnoses:  Viral syndrome  Tick bite, initial encounter  (questionable)   Discharge Instructions     Rest, fluids, tylenol/ibuprofen as needed Await lyme tests    ED Prescriptions    Medication Sig Dispense Auth. Provider   doxycycline (VIBRA-TABS) 100 MG tablet Take 1 tablet (100 mg total) by mouth 2 (two) times daily. 10 tablet Norval Gable, MD     1. Lab results and possible diagnosis  reviewed with patient 2. rx as per orders above; reviewed possible side effects, interactions, risks and benefits;   NOTE: patient only given rx for 5 days of doxycycline while awaiting Lyme tests; if test positive recommend completing a 2 week course; if negative then stop antibiotic  3. Recommend supportive treatment as above 4. Follow-up prn if symptoms worsen or don't improve   Controlled Substance Prescriptions [place] Controlled Substance Registry consulted? Not Applicable   Norval Gable, MD 07/27/18 [phone]

## 2018-07-28 LAB — B. BURGDORFI ANTIBODIES: B burgdorferi Ab IgG+IgM: <0.91 {ISR} (ref 0.00–0.90)

## 2018-08-01 ENCOUNTER — Ambulatory Visit (INDEPENDENT_AMBULATORY_CARE_PROVIDER_SITE_OTHER)
Admission: RE | Admit: 2018-08-01 | Discharge: 2018-08-01 | Disposition: A | Discharge status: Home / Self Care | Payer: Self-pay | Source: Ambulatory Visit | Attending: Cardiovascular Disease | Admitting: Cardiovascular Disease

## 2018-08-01 DIAGNOSIS — R918 Other nonspecific abnormal finding of lung field: Secondary | ICD-10-CM

## 2018-10-04 DIAGNOSIS — H5213 Myopia, bilateral: Secondary | ICD-10-CM | POA: Diagnosis not present

## 2018-11-02 ENCOUNTER — Telehealth: Payer: 59 | Admitting: Nurse Practitioner

## 2018-11-02 DIAGNOSIS — R6889 Other general symptoms and signs: Secondary | ICD-10-CM

## 2018-11-02 MED ORDER — OSELTAMIVIR PHOSPHATE 75 MG PO CAPS
75.0000 mg | ORAL_CAPSULE | Freq: Two times a day (BID) | ORAL | 0 refills | Status: DC
Start: 2018-11-02 — End: 2019-04-03

## 2018-11-02 NOTE — Progress Notes (Signed)

## 2018-11-09 ENCOUNTER — Telehealth: Payer: 59 | Admitting: Family

## 2018-11-09 DIAGNOSIS — J029 Acute pharyngitis, unspecified: Secondary | ICD-10-CM | POA: Diagnosis not present

## 2018-11-09 DIAGNOSIS — Z20818 Contact with and (suspected) exposure to other bacterial communicable diseases: Secondary | ICD-10-CM | POA: Diagnosis not present

## 2018-11-09 MED ORDER — AMOXICILLIN 875 MG PO TABS: 875.0000 mg | ORAL_TABLET | Freq: Two times a day (BID) | ORAL | 0 refills | Status: DC

## 2018-11-09 NOTE — Progress Notes (Signed)
We are sorry that you are not feeling well.  Here is how we plan to help!  Based on what you have shared with me it is likely that you have strep pharyngitis.  Strep pharyngitis is inflammation and infection in the back of the throat.  This is an infection cause by bacteria and is treated with antibiotics.  I have prescribed Amoxicillin 875 mg one tablet twice daily for 7 days. For throat pain, we recommend over the counter oral pain relief medications such as acetaminophen or aspirin, or anti-inflammatory medications such as ibuprofen or naproxen sodium. Topical treatments such as oral throat lozenges or sprays may be used as needed. Strep infections are not as easily transmitted as other respiratory infections, however we still recommend that you avoid close contact with loved ones, especially the very young and elderly.  Remember to wash your hands thoroughly throughout the day as this is the number one way to prevent the spread of infection and wipe down door knobs and counters with disinfectant.   Home Care:  Only take medications as instructed by your medical team.  Complete the entire course of an antibiotic.  Do not take these medications with alcohol.  A steam or ultrasonic humidifier can help congestion.  You can place a towel over your head and breathe in the steam from hot water coming from a faucet.  Avoid close contacts especially the very young and the elderly.  Cover your mouth when you cough or sneeze.  Always remember to wash your hands.  Get Help Right Away If:  You develop worsening fever or sinus pain.  You develop a severe head ache or visual changes.  Your symptoms persist after you have completed your treatment plan.  Make sure you  Understand these instructions.  Will watch your condition.  Will get help right away if you are not doing well or get worse.  Your e-visit answers were reviewed by a board certified advanced clinical practitioner to complete  your personal care plan.  Depending on the condition, your plan could have included both over the counter or prescription medications.  If there is a problem please reply  once you have received a response from your provider.  Your safety is important to [place].  If you have drug allergies check your prescription carefully.    You can use MyChart to ask questions about today's visit, request a non-urgent call back, or ask for a work or school excuse for 24 hours related to this e-Visit. If it has been greater than 24 hours you will need to follow up with your provider, or enter a new e-Visit to address those concerns.  You will get an e-mail in the next two days asking about your experience.  I hope that your e-visit has been valuable and will speed your recovery. Thank you for using e-visits.

## 2019-03-21 ENCOUNTER — Other Ambulatory Visit: Payer: 59

## 2019-03-27 ENCOUNTER — Encounter: Payer: 59 | Admitting: Family Medicine

## 2019-03-29 ENCOUNTER — Other Ambulatory Visit: Payer: 59

## 2019-03-29 DIAGNOSIS — Z0000 Encounter for general adult medical examination without abnormal findings: Secondary | ICD-10-CM | POA: Diagnosis not present

## 2019-04-03 ENCOUNTER — Encounter: Payer: Self-pay | Admitting: Family Medicine

## 2019-04-03 ENCOUNTER — Other Ambulatory Visit: Payer: Self-pay

## 2019-04-03 ENCOUNTER — Ambulatory Visit (INDEPENDENT_AMBULATORY_CARE_PROVIDER_SITE_OTHER): Payer: 59 | Admitting: Family Medicine

## 2019-04-03 VITALS — BP 122/72 | HR 83 | Temp 98.5°F | Resp 16 | Ht 76.0 in | Wt 283.0 lb

## 2019-04-03 DIAGNOSIS — Z0000 Encounter for general adult medical examination without abnormal findings: Secondary | ICD-10-CM

## 2019-04-03 NOTE — Progress Notes (Signed)
Subjective:    Patient ID: Tanner Frye, male    DOB: July 02, 1976, 43 y.o.   MRN: [id number]  Tanner Frye is a 43 y.o. male presenting on 04/03/2019 for Annual Exam   HPI   Here for Annual Physical and Lab Review  History of Elevated Glucose A1c / Elevated BP without HTN / Obesity BMI >34 - Last visit with me 03/2018, for annual, see prior notes for background information. - Interval update with prior elevated A1c 6.2, now due for repeat, was not collected by lab, still pending, also had improved home BP readings, usually high at office with elevated BP - Today patient reports no new concerns. Improved diet, reduced eating problematic foods for him. Improve water, reduce carbs. Weight gradual increase 3 lbs in >6 months Wife checks BP at home she is in healthcare, manual BP - Exercise: No regular exercise. He has home gym and pool. He uses recumbent bike to avoid pressure on foot.  HYPERLIPIDEMIA / Elevated LFTs. - Reports no concerns. Last lipid panel 03/2019, improvement in lipids, reduced total chol LDL TG Last lab showed mild improved LFTs still elevated however. Not on any cholesterol medication   Health Maintenance:  Colon CA Screening: Last Colonoscopy (done on 04/27/17 Dr Vicente Males AGI), results with internal hemorrhoids (diagnostic for bleeding) and found 1 polyp, path result tubular adenoma, good for 5 years (2023 approx). Currently asymptomatic. Known family history of colon CA in more remote relatives, aunts and uncles on father side age >7-70+.  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   Depression screen [facility] 2/9 04/03/2019 03/25/2018 03/07/2018  Decreased Interest 0 0 0  Down, Depressed, Hopeless 0 0 0  PHQ - 2 Score 0 0 0    Past Medical History:  Diagnosis Date  . Anal fissure   . GERD (gastroesophageal reflux disease)    Past Surgical  History:  Procedure Laterality Date  . COLONOSCOPY WITH PROPOFOL N/A 04/27/2017   Procedure: COLONOSCOPY WITH PROPOFOL;  Surgeon: Jonathon Bellows, MD;  Location: [facility] ENDOSCOPY;  Service: Endoscopy;  Laterality: N/A;  . HERNIA REPAIR  1980   Social History   Socioeconomic History  . Marital status: Married    Spouse name: Not on file  . Number of children: Not on file  . Years of education: Vocational Degree  . Highest education level: Associate degree: occupational, Hotel manager, or vocational program  Occupational History  . Not on file  Social Needs  . Financial resource strain: Not on file  . Food insecurity    Worry: Not on file    Inability: Not on file  . Transportation needs    Medical: Not on file    Non-medical: Not on file  Tobacco Use  . Smoking status: Never Smoker  . Smokeless tobacco: Never Used  Substance and Sexual Activity  . Alcohol use: Yes    Alcohol/week: 2.0 standard drinks    Types: 2 Cans of beer per week    Comment: social  . Drug use: No  . Sexual activity: Yes    Partners: Female  Lifestyle  . Physical activity    Days per week: Not on file    Minutes per session: Not on file  . Stress: Not on file  Relationships  . Social Herbalist on phone: Not on file    Gets together: Not on file  Attends religious service: Not on file    Active member of club or organization: Not on file    Attends meetings of clubs or organizations: Not on file    Relationship status: Not on file  . Intimate partner violence    Fear of current or ex partner: Not on file    Emotionally abused: Not on file    Physically abused: Not on file    Forced sexual activity: Not on file  Other Topics Concern  . Not on file  Social History Narrative  . Not on file   Family History  Problem Relation Age of Onset  . Hypertension Mother   . Hyperlipidemia Mother   . Colon cancer Paternal Aunt   . Colon cancer Paternal Uncle   . Prostate cancer Neg Hx    Current  Outpatient Medications on File Prior to Visit  Medication Sig  . EPIPEN 2-PAK 0.3 MG/0.3ML SOAJ injection INJ 0.3 ML IM ONCE FOR 1 DOSE  . ibuprofen (ADVIL,MOTRIN) 100 MG tablet Take 100 mg by mouth every 6 (six) hours as needed for fever.  . Multiple Vitamin (MULTIVITAMIN) tablet Take 1 tablet by mouth daily.  . naproxen (NAPROSYN) 500 MG tablet TAKE 1 TABLET BY MOUTH TWICE DAILY FOR 5 DAYS, THEN MAY USE TWICE DAILY AS NEEDED AFTER THAT  . Omega-3 Fatty Acids (FISH OIL PO) Take by mouth.  Marland Kitchen omeprazole (PRILOSEC) 20 MG capsule Take 20 mg by mouth daily.   No current facility-administered medications on file prior to visit.     Review of Systems  Constitutional: Negative for activity change, appetite change, chills, diaphoresis, fatigue and fever.  HENT: Negative for congestion and hearing loss.   Eyes: Negative for visual disturbance.  Respiratory: Negative for apnea, cough, choking, chest tightness, shortness of breath and wheezing.   Cardiovascular: Negative for chest pain, palpitations and leg swelling.  Gastrointestinal: Negative for abdominal pain, anal bleeding, blood in stool, constipation, diarrhea, nausea and vomiting.  Endocrine: Negative for cold intolerance.  Genitourinary: Negative for difficulty urinating, dysuria, frequency and hematuria.  Musculoskeletal: Negative for arthralgias, back pain and neck pain.  Skin: Negative for rash.  Allergic/Immunologic: Negative for environmental allergies.  Neurological: Negative for dizziness, weakness, light-headedness, numbness and headaches.  Hematological: Negative for adenopathy.  Psychiatric/Behavioral: Negative for behavioral problems, dysphoric mood and sleep disturbance. The patient is not nervous/anxious.    Per HPI unless specifically indicated above     Objective:    BP 122/72   Pulse 83   Temp 98.5 F (36.9 C) (Oral)   Resp 16   Ht 6\' 4"  (1.93 m)   Wt 283 lb (128.4 kg)   BMI 34.45 kg/m   Wt Readings from Last 3  Encounters:  04/03/19 283 lb (128.4 kg)  07/27/18 280 lb (127 kg)  03/25/18 278 lb (126.1 kg)    Physical Exam Vitals signs and nursing note reviewed.  Constitutional:      General: He is not in acute distress.    Appearance: He is well-developed. He is not diaphoretic.     Comments: Well-appearing, comfortable, cooperative  HENT:     Head: Normocephalic and atraumatic.  Eyes:     General:        Right eye: No discharge.        Left eye: No discharge.     Conjunctiva/sclera: Conjunctivae normal.     Pupils: Pupils are equal, round, and reactive to light.  Neck:     Musculoskeletal: Normal range of  motion and neck supple.     Thyroid: No thyromegaly.  Cardiovascular:     Rate and Rhythm: Normal rate and regular rhythm.     Heart sounds: Normal heart sounds. No murmur.  Pulmonary:     Effort: Pulmonary effort is normal. No respiratory distress.     Breath sounds: Normal breath sounds. No wheezing or rales.  Abdominal:     General: Bowel sounds are normal. There is no distension.     Palpations: Abdomen is soft. There is no mass.     Tenderness: There is no abdominal tenderness.  Musculoskeletal: Normal range of motion.        General: No tenderness.     Comments: Upper / Lower Extremities: - Normal muscle tone, strength bilateral upper extremities 5/5, lower extremities 5/5  Lymphadenopathy:     Cervical: No cervical adenopathy.  Skin:    General: Skin is warm and dry.     Findings: No erythema or rash.  Neurological:     Mental Status: He is alert and oriented to person, place, and time.     Comments: Distal sensation intact to light touch all extremities  Psychiatric:        Behavior: Behavior normal.     Comments: Well groomed, good eye contact, normal speech and thoughts        Results for orders placed or performed in visit on 03/29/19  Lipid Profile  Result Value Ref Range   Cholesterol 223 (H) <200 mg/dL   HDL 49 > OR = 40 mg/dL   Triglycerides 189 (H)  <150 mg/dL   LDL Cholesterol (Calc) 141 (H) mg/dL (calc)   Total CHOL/HDL Ratio 4.6 <5.0 (calc)   Non-HDL Cholesterol (Calc) 174 (H) <130 mg/dL (calc)  Comprehensive Metabolic Panel (CMET)  Result Value Ref Range   Glucose, Bld 125 (H) 65 - 99 mg/dL   BUN 10 7 - 25 mg/dL   Creat 0.84 0.60 - 1.35 mg/dL   BUN/Creatinine Ratio NOT APPLICABLE 6 - 22 (calc)   Sodium 139 135 - 146 mmol/L   Potassium 4.3 3.5 - 5.3 mmol/L   Chloride 105 98 - 110 mmol/L   CO2 26 20 - 32 mmol/L   Calcium 8.9 8.6 - 10.3 mg/dL   Total Protein 6.7 6.1 - 8.1 g/dL   Albumin 4.1 3.6 - 5.1 g/dL   Globulin 2.6 1.9 - 3.7 g/dL (calc)   AG Ratio 1.6 1.0 - 2.5 (calc)   Total Bilirubin 0.7 0.2 - 1.2 mg/dL   Alkaline phosphatase (APISO) 46 36 - 130 U/L   AST 47 (H) 10 - 40 U/L   ALT 70 (H) 9 - 46 U/L  CBC with Differential  Result Value Ref Range   WBC 8.4 3.8 - 10.8 Thousand/uL   RBC 5.26 4.20 - 5.80 Million/uL   Hemoglobin 15.3 13.2 - 17.1 g/dL   HCT 46.0 38.5 - 50.0 %   MCV 87.5 80.0 - 100.0 fL   MCH 29.1 27.0 - 33.0 pg   MCHC 33.3 32.0 - 36.0 g/dL   RDW 12.9 11.0 - 15.0 %   Platelets 273 140 - 400 Thousand/uL   MPV 9.7 7.5 - 12.5 fL   Neutro Abs 4,057 1,500 - 7,800 cells/uL   Lymphs Abs 3,276 850 - 3,900 cells/uL   Absolute Monocytes 832 200 - 950 cells/uL   Eosinophils Absolute 168 15 - 500 cells/uL   Basophils Absolute 67 0 - 200 cells/uL   Neutrophils Relative % 48.3 %  Total Lymphocyte 39.0 %   Monocytes Relative 9.9 %   Eosinophils Relative 2.0 %   Basophils Relative 0.8 %      Assessment & Plan:   Problem List Items Addressed This Visit    None    Visit Diagnoses    Annual physical exam    -  Primary      Updated Health Maintenance information Reviewed recent lab results with patient - Improving elevated LFTs, Hyperlipidemia - keep improving lifestyle - Add A1c to lab order called [facility]. Also added hep b titer, due to work exposure, pending result from prior vaccine he received years  ago. Encouraged improvement to lifestyle with diet and exercise - Goal of weight loss  No orders of the defined types were placed in this encounter.   Follow up plan: Return in about 1 year (around 04/02/2020) for Annual Physical.  q 6 month if A1c abnormal or worsening, otherwise can do 1 year, will consider future labs pending result.  Nobie Putnam, [facility] 04/03/2019, 2:14 PM

## 2019-04-03 NOTE — Patient Instructions (Addendum)
Thank you for coming to the office today.  Will work on getting orders for Hepatitis B Titer (immunity)  Also we need to get updated A1c 3 month average sugar result - was not collected with rest of blood work - stay tuned.  Keep improving lifestyle. Overall liver enzymes improved. Cholesterol is lower.  Keep track of Blood Pressure occasionally at home. Reading improved today on repeat.  DUE for FASTING BLOOD WORK (no food or drink after midnight before the lab appointment, only water or coffee without cream/sugar on the morning of)  SCHEDULE "Lab Only" visit in the morning at the clinic for lab draw in 1 YEAR  - Make sure Lab Only appointment is at about 1 week before your next appointment, so that results will be available  For Lab Results, once available within 2-3 days of blood draw, you can can log in to MyChart online to view your results and a brief explanation. Also, we can discuss results at next follow-up visit.   Please schedule a Follow-up Appointment to: Return in about 1 year (around 04/02/2020) for Annual Physical.  If you have any other questions or concerns, please feel free to call the office or send a message through [place]. You may also schedule an earlier appointment if necessary.  Additionally, you may be receiving a survey about your experience at our office within a few days to 1 week by e-mail or mail. We value your feedback.  Nobie Putnam, DO [place]

## 2019-04-04 LAB — COMPREHENSIVE METABOLIC PANEL
AG Ratio: 1.6 (calc) (ref 1.0–2.5)
ALT: 70 U/L — ABNORMAL HIGH (ref 9–46)
AST: 47 U/L — ABNORMAL HIGH (ref 10–40)
Albumin: 4.1 g/dL (ref 3.6–5.1)
Alkaline phosphatase (APISO): 46 U/L (ref 36–130)
BUN: 10 mg/dL (ref 7–25)
CO2: 26 mmol/L (ref 20–32)
Calcium: 8.9 mg/dL (ref 8.6–10.3)
Chloride: 105 mmol/L (ref 98–110)
Creat: 0.84 mg/dL (ref 0.60–1.35)
Globulin: 2.6 g/dL (calc) (ref 1.9–3.7)
Glucose, Bld: 125 mg/dL — ABNORMAL HIGH (ref 65–99)
Potassium: 4.3 mmol/L (ref 3.5–5.3)
Sodium: 139 mmol/L (ref 135–146)
Total Bilirubin: 0.7 mg/dL (ref 0.2–1.2)
Total Protein: 6.7 g/dL (ref 6.1–8.1)

## 2019-04-04 LAB — CBC WITH DIFFERENTIAL/PLATELET
Absolute Monocytes: 832 cells/uL (ref 200–950)
Basophils Absolute: 67 cells/uL (ref 0–200)
Basophils Relative: 0.8 %
Eosinophils Absolute: 168 cells/uL (ref 15–500)
Eosinophils Relative: 2 %
HCT: 46 % (ref 38.5–50.0)
Hemoglobin: 15.3 g/dL (ref 13.2–17.1)
Lymphs Abs: 3276 cells/uL (ref 850–3900)
MCH: 29.1 pg (ref 27.0–33.0)
MCHC: 33.3 g/dL (ref 32.0–36.0)
MCV: 87.5 fL (ref 80.0–100.0)
MPV: 9.7 fL (ref 7.5–12.5)
Monocytes Relative: 9.9 %
Neutro Abs: 4057 cells/uL (ref 1500–7800)
Neutrophils Relative %: 48.3 %
Platelets: 273 10*3/uL (ref 140–400)
RBC: 5.26 10*6/uL (ref 4.20–5.80)
RDW: 12.9 % (ref 11.0–15.0)
Total Lymphocyte: 39 %
WBC: 8.4 10*3/uL (ref 3.8–10.8)

## 2019-04-04 LAB — TEST AUTHORIZATION

## 2019-04-04 LAB — LIPID PANEL
Cholesterol: 223 mg/dL — ABNORMAL HIGH (ref <200)
HDL: 49 mg/dL (ref >=40)
LDL Cholesterol (Calc): 141 mg/dL (calc) — ABNORMAL HIGH
Non-HDL Cholesterol (Calc): 174 mg/dL (calc) — ABNORMAL HIGH (ref <130)
Total CHOL/HDL Ratio: 4.6 (calc) (ref <5.0)
Triglycerides: 189 mg/dL — ABNORMAL HIGH (ref <150)

## 2019-04-04 LAB — HEPATITIS B SURFACE ANTIBODY, QUANTITATIVE: Hep B S AB Quant (Post): <5 m[IU]/mL — ABNORMAL LOW (ref >=10)

## 2019-04-04 LAB — TEST AUTHORIZATION 2

## 2019-04-04 LAB — HEMOGLOBIN A1C W/OUT EAG: Hgb A1c MFr Bld: 6.8 % of total Hgb — ABNORMAL HIGH (ref <5.7)

## 2019-10-20 DIAGNOSIS — E119 Type 2 diabetes mellitus without complications: Secondary | ICD-10-CM

## 2019-10-20 DIAGNOSIS — I10 Essential (primary) hypertension: Secondary | ICD-10-CM

## 2019-10-20 HISTORY — DX: Essential (primary) hypertension: I10

## 2019-10-20 HISTORY — DX: Type 2 diabetes mellitus without complications: E11.9

## 2019-11-27 ENCOUNTER — Encounter: Payer: Self-pay | Admitting: Family Medicine

## 2019-11-27 ENCOUNTER — Ambulatory Visit (INDEPENDENT_AMBULATORY_CARE_PROVIDER_SITE_OTHER): Payer: 59 | Admitting: Family Medicine

## 2019-11-27 ENCOUNTER — Other Ambulatory Visit: Payer: Self-pay

## 2019-11-27 DIAGNOSIS — G8929 Other chronic pain: Secondary | ICD-10-CM | POA: Diagnosis not present

## 2019-11-27 DIAGNOSIS — M25562 Pain in left knee: Secondary | ICD-10-CM | POA: Diagnosis not present

## 2019-11-27 NOTE — Patient Instructions (Addendum)
I recommend a Knee Brace to support your knee and limit motion of the knee to help it heal, avoid excess weight bearing and repetitive activities on it  Recommend trial of Anti-inflammatory with advil or aleve as needed with food for 1-2 weeks follow package instructions OTC - It is safe to take Tylenol Ext Str 500mg  tabs - take 1 to 2 (max dose 1000mg ) every 6 hours as needed for breakthrough pain, max 24 hour daily dose is 6 to 8 tablets or 4000mg   Use RICE therapy: - R - Rest / relative rest with activity modification avoid overuse and frequent bending or pressure on bent knee - I - Ice packs (make sure you use a towel or sock / something to protect skin) - C - Compression with ACE wrap or immobilizer apply pressure and reduce swelling allowing more support - E - Elevation - if significant swelling, lift leg above heart level (toes above your nose) to help reduce swelling, most helpful at night after day of being on your feet  Stay tuned for apt - referral sent.  [facility] [place], [place]  [id number] Phone: [phone]   Please schedule a Follow-up Appointment to: Return if symptoms worsen or fail to improve, for knee pain.  If you have any other questions or concerns, please feel free to call the office or send a message through [place]. You may also schedule an earlier appointment if necessary.  Additionally, you may be receiving a survey about your experience at our office within a few days to 1 week by e-mail or mail. We value your feedback.  Nobie Putnam, DO [place]

## 2019-11-27 NOTE — Progress Notes (Signed)
Virtual Visit via Telephone The purpose of this virtual visit is to provide medical care while limiting exposure to the novel coronavirus (COVID19) for both patient and office staff.  Consent was obtained for phone visit:  Yes.   Answered questions that patient had about telehealth interaction:  Yes.   I discussed the limitations, risks, security and privacy concerns of performing an evaluation and management service by telephone. I also discussed with the patient that there may be a patient responsible charge related to this service. The patient expressed understanding and agreed to proceed.  Patient Location: Home Provider Location: [facility])  ---------------------------------------------------------------------- Chief Complaint  Patient presents with  . Knee Pain    intermittent left knee pain x 2 yr. The patient state the pain worsen when he's pushing up to stand or going up stairs.     S: Reviewed CMA documentation. I have called patient and gathered additional HPI as follows:  Left Knee Pain, chronic Reports that symptoms with intermittent chronic L knee pain over past 2 years +, he has used a brace vs sleeve in past for compression in past with flare up, usually has flare up couple times a year, usually triggered with turning "wrong" or something similar and then it improves quickly. - Recent acute onset, yesterday started to flare up with L knee pain similar to past, did not actually injure it but he describes issue with carrying groceries and going downstairs thinks it flared up, describes sharp shooting pain, top part of knee cap or above it. Admits mild ache at rest, worse if get up and if active will cause pain. He denies significant instability and does not feel like his knee "buckles or gives way". - Usually doesn't take much for Advil/ NSAID or Tylenol - No prior knee surgery or imaging or procedures Denies any fall or acute injury, swelling redness  bruising numbness tingling   Denies any high risk travel to areas of current concern for COVID19. Denies any known or suspected exposure to person with or possibly with COVID19.  Denies any fevers, chills, sweats, body ache, cough, shortness of breath, sinus pain or pressure, headache, abdominal pain, diarrhea  Past Medical History:  Diagnosis Date  . Anal fissure   . GERD (gastroesophageal reflux disease)    Social History   Tobacco Use  . Smoking status: Never Smoker  . Smokeless tobacco: Never Used  Substance Use Topics  . Alcohol use: Yes    Alcohol/week: 2.0 standard drinks    Types: 2 Cans of beer per week    Comment: social  . Drug use: No    Current Outpatient Medications:  .  Ascorbic Acid (VITAMIN C) 1000 MG tablet, Take 1,000 mg by mouth daily., Disp: , Rfl:  .  Multiple Vitamin (MULTIVITAMIN) tablet, Take 1 tablet by mouth daily., Disp: , Rfl:  .  omeprazole (PRILOSEC) 20 MG capsule, Take 20 mg by mouth daily., Disp: , Rfl:  .  EPIPEN 2-PAK 0.3 MG/0.3ML SOAJ injection, INJ 0.3 ML IM ONCE FOR 1 DOSE, Disp: , Rfl: 0 .  ibuprofen (ADVIL,MOTRIN) 100 MG tablet, Take 200 mg by mouth every 6 (six) hours as needed for fever. , Disp: , Rfl:  .  naproxen (NAPROSYN) 500 MG tablet, TAKE 1 TABLET BY MOUTH TWICE DAILY FOR 5 DAYS, THEN MAY USE TWICE DAILY AS NEEDED AFTER THAT (Patient not taking: Reported on 11/27/2019), Disp: 60 tablet, Rfl: 2 .  Omega-3 Fatty Acids (FISH OIL PO), Take by  mouth., Disp: , Rfl:   Depression screen [facility] 2/9 04/03/2019 03/25/2018 03/07/2018  Decreased Interest 0 0 0  Down, Depressed, Hopeless 0 0 0  PHQ - 2 Score 0 0 0    No flowsheet data found.  -------------------------------------------------------------------------- O: No physical exam performed due to remote telephone encounter.  Lab results reviewed.  No results found for this or any previous visit (from the past 2160  hour(s)).  -------------------------------------------------------------------------- A&P:  Problem List Items Addressed This Visit    None    Visit Diagnoses    Chronic pain of left knee    -  Primary   Relevant Orders   Ambulatory referral to Orthopedic Surgery     Acute on chronic L lateral vs superior area knee pain without obvious swelling or recent injury Recurrent episodic problem with localized L knee pain for >2 years now No prior imaging, uncertain if OA/DJD associated Virtual visit unable to do ligamentous testing but patient not reporting any instability of knee - Able to bear weight - No prior history of knee surgery, arthroscopy - Inadequate conservative therapy   Plan: 1. Proceed with orthopedic referral - [facility] - given condition present for >2 years with episodic intermittent flares, patient elects to consult ortho and x-ray images and further management, advised him we could offer some initial therapy here, but will defer to ortho now at this time given chronic nature of his problem, would like to rule out underlying more chronic injury or issues  Orders Placed This Encounter  Procedures  . Ambulatory referral to Orthopedic Surgery    Referral Priority:   Routine    Referral Type:   Surgical    Referral Reason:   Specialty Services Required    Requested Specialty:   Orthopedic Surgery    Number of Visits Requested:   1     No orders of the defined types were placed in this encounter.   Follow-up: - Return as needed  Patient verbalizes understanding with the above medical recommendations including the limitation of remote medical advice.  Specific follow-up and call-back criteria were given for patient to follow-up or seek medical care more urgently if needed.   - Time spent in direct consultation with patient on phone: 9 minutes   Nobie Putnam, [facility] 11/27/2019, 3:45 PM

## 2019-11-30 ENCOUNTER — Telehealth: Payer: Self-pay | Admitting: Family Medicine

## 2019-11-30 DIAGNOSIS — M25562 Pain in left knee: Secondary | ICD-10-CM

## 2019-11-30 DIAGNOSIS — G8929 Other chronic pain: Secondary | ICD-10-CM

## 2019-11-30 MED ORDER — GABAPENTIN 100 MG PO CAPS: ORAL_CAPSULE | ORAL | 1 refills | Status: DC

## 2019-11-30 MED ORDER — CYCLOBENZAPRINE HCL 10 MG PO TABS: 5.0000 mg | ORAL_TABLET | Freq: Three times a day (TID) | ORAL | 0 refills | Status: DC | PRN

## 2019-11-30 NOTE — Telephone Encounter (Signed)
Pt.said that pain in his knee was not getting better was taken 2 naproxen . Wanted to know if you would call something in him.

## 2019-11-30 NOTE — Telephone Encounter (Signed)
Called patient, offered flexeril and gabapentin, reviewed dosing, benefit and side effect, caution sedation avoid taking at same time, continue naproxen if need, he has [facility] on 12/04/19  Nobie Putnam, [facility] 11/30/2019, 12:26 PM

## 2019-12-04 DIAGNOSIS — M7652 Patellar tendinitis, left knee: Secondary | ICD-10-CM | POA: Diagnosis not present

## 2019-12-04 DIAGNOSIS — M25562 Pain in left knee: Secondary | ICD-10-CM | POA: Diagnosis not present

## 2019-12-04 DIAGNOSIS — M222X2 Patellofemoral disorders, left knee: Secondary | ICD-10-CM | POA: Diagnosis not present

## 2019-12-25 ENCOUNTER — Ambulatory Visit: Payer: 59 | Admitting: Physical Therapy

## 2019-12-25 ENCOUNTER — Other Ambulatory Visit: Payer: Self-pay

## 2019-12-25 ENCOUNTER — Ambulatory Visit: Payer: 59 | Attending: Internal Medicine

## 2019-12-25 DIAGNOSIS — Z23 Encounter for immunization: Secondary | ICD-10-CM | POA: Insufficient documentation

## 2019-12-25 NOTE — Progress Notes (Signed)
   Covid-19 Vaccination Clinic  Name:  Tanner Frye    MRN: [id number] DOB: 11/22/1975  12/25/2019  [name] was observed post Covid-19 immunization for 15 minutes without incident. He was provided with Vaccine Information Sheet and instruction to access the V-Safe system.   [name] was instructed to call 911 with any severe reactions post vaccine: Marland Kitchen Difficulty breathing  . Swelling of face and throat  . A fast heartbeat  . A bad rash all over body  . Dizziness and weakness   Immunizations Administered    Name Date Dose VIS Date Route   Pfizer COVID-19 Vaccine 12/25/2019  9:32 AM 0.3 mL 09/29/2019 Intramuscular   Manufacturer: [place]   Lot: [id number]   [place]: [id number]

## 2019-12-27 ENCOUNTER — Ambulatory Visit: Payer: 59 | Attending: Orthopedic Surgery | Admitting: Physical Therapy

## 2019-12-27 ENCOUNTER — Other Ambulatory Visit: Payer: Self-pay

## 2019-12-27 DIAGNOSIS — G8929 Other chronic pain: Secondary | ICD-10-CM | POA: Insufficient documentation

## 2019-12-27 DIAGNOSIS — M25562 Pain in left knee: Secondary | ICD-10-CM | POA: Insufficient documentation

## 2019-12-30 NOTE — Therapy (Addendum)
[facility] [address] [place], [place], [address] Phone: [phone]   Fax:  [phone]  Physical Therapy Evaluation  Patient Details  Name: Tanner Frye MRN: [id number] Date of Birth: 03-17-1976 Referring Provider (PT): Tamala Julian, [place]   Encounter Date: 12/27/2019    PT End of Session - 12/30/19 1308    Visit Number  1    Number of Visits  1    Date for PT Re-Evaluation  12/28/19    PT Start Time  0931    PT Stop Time  1027    PT Time Calculation (min)  56 min    Activity Tolerance  Patient tolerated treatment well         Past Medical History:  Diagnosis Date  . Anal fissure   . GERD (gastroesophageal reflux disease)     Past Surgical History:  Procedure Laterality Date  . COLONOSCOPY WITH PROPOFOL N/A 04/27/2017   Procedure: COLONOSCOPY WITH PROPOFOL;  Surgeon: Jonathon Bellows, MD;  Location: [facility] ENDOSCOPY;  Service: Endoscopy;  Laterality: N/A;  . HERNIA REPAIR  1980    There were no vitals filed for this visit.   Subjective Assessment - 01/01/20 0749    Subjective  Pt. reports chroinc L knee pain.  Pt. states he has occasional flare-ups in L knee during increase activity and will wear knee brace for a few days to help.  Pt. reports minimal to no L knee pain at this time.  Pt. is currently not exercising and remains busy with work (mostly sedentary behind a computer).    Limitations  Standing;Walking    Diagnostic tests  see chart    Patient Stated Goals  Increase L knee stability.    Currently in Pain?  No/denies        B knee AROM: WNL B LE muscle strength: 5/5 MMT L patellar tracking (lateral direction)- discussed knee brace/ taping.  Pt. Benefits from knee brace. (-) varus/ valgus stress test (-) anterior drawer/ posterior drawer (-) meniscal involvement   Normalized gait pattern while walking around PT gym     Objective measurements completed on examination: See above findings.     See handouts   PT Education - 01/01/20 0759    Education provided  Yes    Education Details  Quad stability ex program/ Hip and knee stretches.    Person(s) Educated  Patient    Methods  Explanation;Demonstration;Handout    Comprehension  Verbalized understanding;Returned demonstration           Plan - 01/01/20 0801    Clinical Impression Statement  Pt. is a 43 y/o male with chronic h/o L knee pain.  Pt. reports no pain at time of evaluation but soreness in L knee.  B knee AROM WNL and strength grossly 5/5 MMT.  L patella tracking issues noted in L knee during supine quad set.  Generalized B hamstring/ ITB tightness noted.  Slight L infrapatellar/ medial knee tenderness with deep palpation.  No swelling noted in L knee as compared to R.  Pt. ambulates with normalized gait pattern but reports increaes L antalgic gait when pain exacerbates.  FOTO: initial 62/ goal 75.  PT issued a LE stretching/ strengthening ex. program at time of initial evaluation. Pt. instructed to focus on exercises on an independent basis and contact PT if any questions/ issues.  No additional PT services at this time.    Stability/Clinical Decision Making  Stable/Uncomplicated    Clinical Decision Making  Low    Rehab Potential  Good    PT Frequency  One time visit    PT Treatment/Interventions  Cryotherapy;Electrical Stimulation;Iontophoresis 4mg /ml Dexamethasone;Moist Heat;Gait training;Stair training;Functional mobility training;Therapeutic activities;Balance training;Therapeutic exercise;Orthotic Fit/Training;Patient/family education;Neuromuscular re-education;Manual techniques;Passive range of motion;Dry needling    PT Next Visit Plan  Pt. instructed to contact PT in a couple weeks to discuss status.    PT Home Exercise Plan  see handouts       Patient will benefit from skilled therapeutic intervention in order to improve the following deficits and impairments:  Pain, Decreased mobility, Decreased activity  tolerance, Decreased range of motion, Decreased strength, Hypomobility, Difficulty walking  Visit Diagnosis: Chronic pain of left knee     Problem List Patient Active Problem List   Diagnosis Date Noted  . Elevated hemoglobin A1c 03/29/2018  . Mixed hyperlipidemia 03/29/2018  . Elevated LFTs 03/29/2018  . Obesity (BMI 30.0-34.9) 03/25/2018  . 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 Pes planus 06/11/2017  . Abnormal gait 06/11/2017  . Elevated BP without diagnosis of hypertension 06/19/2014  . Family history of hyperlipidemia 06/19/2014   Pura Spice, PT, DPT # [id number] 01/01/2020, 8:14 AM  [facility] [address] [place], [place], [address] Phone: [phone]   Fax:  [phone]  Name: Tanner Frye MRN: [id number] Date of Birth: [birth year]

## 2020-01-01 ENCOUNTER — Encounter: Payer: Self-pay | Admitting: Physical Therapy

## 2020-01-01 ENCOUNTER — Telehealth: Payer: Self-pay

## 2020-01-01 NOTE — Telephone Encounter (Signed)
Patient wife attempted to schedule routine visit.    Not needed at this time advised by Dr. Fletcher Anon to call back if needed for issues

## 2020-01-01 NOTE — Addendum Note (Signed)
Addended by: Pura Spice on: 01/01/2020 08:31 AM   Modules accepted: Orders

## 2020-01-17 ENCOUNTER — Ambulatory Visit: Payer: 59 | Attending: Internal Medicine

## 2020-01-17 DIAGNOSIS — Z23 Encounter for immunization: Secondary | ICD-10-CM

## 2020-01-17 NOTE — Progress Notes (Signed)
   Covid-19 Vaccination Clinic  Name:  Tanner Frye    MRN: [id number] DOB: [birth year]  01/17/2020  Tanner Frye was observed post Covid-19 immunization for 30 minutes based on pre-vaccination screening .  During the observation period, he experienced an adverse reaction with the following symptoms:Tingling and  swelling of face and throat.Tighting sensation to the face including mouth. Sweating, with redness to the face. Mild non productive cough.  Assessment : Time of assessment 0932am. Alert and oriented. Nurse assessed lung sounds, VS monitored for 30 minutes.  Actions taken: assessment of face and mouth, no swelling seen, face is red and he has sweating forehead.  Vitals sign taken at 0932 with BP- 185/131, P- 89. R-17 O2- 98% , Benadryl 12.5 mg liquid given po x 1, VS rechecked at 0937, BP - 190/134, P- 84, T- 98.4. R- 18, O2- 98%, Benadryl 12.5 liquid given PO at 0927. EMS on site due to another issue, assessed pts lung sounds and assessed for auditory stridor to neck, patient with slight cough, voices that he is feeling some better, he remains sitting in wheelchair. VS at 0947 , BP- 166/127, P- 82. O2- 98%, VS at 1002- BP_ 154/110, P- 85, O2- 96%.    Medications administered: Diphenhydramine (Benadryl) 12.5 ml (25 mg) by mouth. Time: [id number] and [id number]. Administered by RN.  Disposition:Reports swelling to face and mouth has improved, he is advised to follow up with PCP for elevated BP AND SYMPTOMS ASSOCIATED WITH VACCINE/  Immunizations Administered    Name Date Dose VIS Date Route   Pfizer COVID-19 Vaccine 01/17/2020  9:08 AM 0.3 mL 09/29/2019 Intramuscular   Manufacturer: [place]   Lot: [id number]   [place]: [id number]

## 2020-01-23 ENCOUNTER — Encounter: Payer: Self-pay | Admitting: *Deleted

## 2020-01-23 ENCOUNTER — Telehealth: Payer: Self-pay | Admitting: *Deleted

## 2020-01-23 NOTE — Telephone Encounter (Signed)
Called patient in regards to the COVID vaccine that he received on 01/17/2020. Asked patient to return call to discuss how he is feeling and if he has had any further symptoms or side effects. Will follow up.

## 2020-01-26 ENCOUNTER — Ambulatory Visit (INDEPENDENT_AMBULATORY_CARE_PROVIDER_SITE_OTHER): Payer: 59 | Admitting: Family Medicine

## 2020-01-26 ENCOUNTER — Encounter: Payer: Self-pay | Admitting: Family Medicine

## 2020-01-26 ENCOUNTER — Other Ambulatory Visit: Payer: Self-pay

## 2020-01-26 VITALS — BP 163/105 | HR 90 | Temp 98.6°F | Resp 16 | Ht 76.0 in | Wt 293.0 lb

## 2020-01-26 DIAGNOSIS — R7309 Other abnormal glucose: Secondary | ICD-10-CM | POA: Diagnosis not present

## 2020-01-26 DIAGNOSIS — R81 Glycosuria: Secondary | ICD-10-CM | POA: Diagnosis not present

## 2020-01-26 DIAGNOSIS — R3915 Urgency of urination: Secondary | ICD-10-CM

## 2020-01-26 DIAGNOSIS — R339 Retention of urine, unspecified: Secondary | ICD-10-CM | POA: Diagnosis not present

## 2020-01-26 LAB — POCT URINALYSIS DIPSTICK
Bilirubin, UA: NEGATIVE
Blood, UA: NEGATIVE
Glucose, UA: POSITIVE — AB
Ketones, UA: NEGATIVE
Leukocytes, UA: NEGATIVE
Nitrite, UA: NEGATIVE
Protein, UA: NEGATIVE
Spec Grav, UA: 1.01 (ref 1.010–1.025)
Urobilinogen, UA: 0.2 E.U./dL
pH, UA: 5 (ref 5.0–8.0)

## 2020-01-26 MED ORDER — TAMSULOSIN HCL 0.4 MG PO CAPS: 0.4000 mg | ORAL_CAPSULE | Freq: Every day | ORAL | 1 refills | Status: DC

## 2020-01-26 NOTE — Patient Instructions (Addendum)
Thank you for coming to the office today.  Start Flomax 0.4mg  daily for improved urination. (help if there is an enlargement of prostate)  Next - we will see what sugar reading is A1c, previously 6.8, if >6.5 then considered type 2 diabetes. We would need to discuss again management options. High sugar can increase urination. May need to treat this sooner as well.  Also - if urine culture comes back negative. No sign of infection. Then it could still be a prostate infection or prostatitis if the Flomax is not helping enough  Keep in touch within next 1-2 weeks.  Please schedule a Follow-up Appointment to: Return in about 2 weeks (around 02/09/2020), or if symptoms worsen or fail to improve, for 1-2 weeks follow-up urinary retention high sugar.  If you have any other questions or concerns, please feel free to call the office or send a message through [place]. You may also schedule an earlier appointment if necessary.  Additionally, you may be receiving a survey about your experience at our office within a few days to 1 week by e-mail or mail. We value your feedback.  Nobie Putnam, DO [place]

## 2020-01-26 NOTE — Progress Notes (Signed)
Subjective:    Patient ID: Tanner Frye, male    DOB: May 23, 1976, 44 y.o.   MRN: [id number]  Tanner Frye is a 44 y.o. male presenting on 01/26/2020 for Urinary Retention (onset 3 weeks denies most UTI sxs)   HPI   Pre-Diabetes / Elevated A1c Last testing for blood sugar was in 03/2019, A1c up to 6.8, concern at that time was for may be at risk for new Type 2 Diabetes. No follow-up since that time for blood sugar follow-up. He is not on medication. No clear fam history of type 2 diabetes.  Urinary Retention Reports symptoms started 2-3 weeks ago, he feels an urgency and urge to void, but he has difficulty with small amount of urine will come out. He will often feel incomplete emptying, and will still feel full after. - Never on medicine for prostate before Denies dysuria, hematuria, nausea vomiting   AUA BPH Symptom Score over past 1 month 1. Sensation of not emptying bladder post void - 5 2. Urinate less than 2 hour after finish last void - 4 3. Start/Stop several times during void - 5 4. Difficult to postpone urination - 2 5. Weak urinary stream - 2 6. Push or strain urination - 0 7. Nocturia - 1 times  Total Score: 19 (Moderate BPH symptoms)    Health Maintenance:  Father's family history with prostate problems.  Depression screen [facility] 2/9 01/26/2020 04/03/2019 03/25/2018  Decreased Interest 0 0 0  Down, Depressed, Hopeless 0 0 0  PHQ - 2 Score 0 0 0    Social History   Tobacco Use  . Smoking status: Never Smoker  . Smokeless tobacco: Never Used  Substance Use Topics  . Alcohol use: Yes    Alcohol/week: 2.0 standard drinks    Types: 2 Cans of beer per week    Comment: social  . Drug use: No    Review of Systems Per HPI unless specifically indicated above     Objective:    BP (!) 163/105   Pulse 90   Temp 98.6 F (37 C) (Temporal)   Resp 16   Ht 6\' 4"  (1.93 m)   Wt 293 lb (132.9 kg)   BMI 35.67 kg/m   Wt Readings from Last 3 Encounters:  01/26/20  293 lb (132.9 kg)  04/03/19 283 lb (128.4 kg)  07/27/18 280 lb (127 kg)    Physical Exam Vitals and nursing note reviewed.  Constitutional:      General: He is not in acute distress.    Appearance: He is well-developed. He is not diaphoretic.  Genitourinary:    Comments: Rectal/DRE: Some enlarged internal hemorrhoids, palpable mildly enlarged prostate with some bogginess and discomfort on exam. No nodularity or other abnormality. Skin:    General: Skin is warm and dry.  Neurological:     Mental Status: He is alert.    Results for orders placed or performed in visit on 01/26/20  POCT Urinalysis Dipstick  Result Value Ref Range   Color, UA amber    Clarity, UA clear    Glucose, UA Positive (A) Negative   Bilirubin, UA negative    Ketones, UA negative    Spec Grav, UA 1.010 1.010 - 1.025   Blood, UA negative    pH, UA 5.0 5.0 - 8.0   Protein, UA Negative Negative   Urobilinogen, UA 0.2 0.2 or 1.0 E.U./dL   Nitrite, UA negative    Leukocytes, UA Negative Negative   Appearance  Odor     Recent Labs    03/29/19 0937  HGBA1C 6.8*       Assessment & Plan:   Problem List Items Addressed This Visit    Elevated hemoglobin A1c   Relevant Orders   Hemoglobin A1c    Other Visit Diagnoses    Urine retention    -  Primary   Relevant Medications   tamsulosin (FLOMAX) 0.4 MG CAPS capsule   Other Relevant Orders   POCT Urinalysis Dipstick (Completed)   COMPLETE METABOLIC PANEL WITH GFR   Urine Culture   Urinary urgency       Relevant Medications   tamsulosin (FLOMAX) 0.4 MG CAPS capsule   Other Relevant Orders   COMPLETE METABOLIC PANEL WITH GFR   Urine Culture   Elevated serum glucose with glucosuria       Relevant Orders   Urine Culture      #PreDM A1c Elevated / Glucosuria  Last results 6.2 up to 6.8 (03/2019) Suspected elevated sugar, hyperglycemia today contributing to his urinary symptoms with evidence glucosuria on dipstick UA today Can increase risk of  UTI. Will check culture. Overdue for A1c - Will check serum A1c today, f/u results, if >6.5 then new dx Type 2 Diabetes, will likely need some sort of medication to control, otherwise we can work towards lifestyle improvements for low carb/ sugar diet to improve blood glucose and limit the effect on his urinary symptoms  #Urinary Retention / LUTS / Suspected BPH vs Prostatitis Clinically with subacute or recent worsening LUTS. Mixed symptoms, but primarily urinary retention and some difficulty voiding. AUA BPH score is high, at risk of BPH based on current symptoms. - Again uncertain exact etiology, age 28, may not have new dx BPH but it is possible, otherwise I strongly suspect elevated A1c hyperglycemia is leading to glucosuria and that is worsening his urinary urgency etc - Cannot rule out UTI will check urine culture as well to see if contributing - pending result, no antibiotic at this time - Additionally based on prostate exam and history may consider prostatitis as well, will hold antibiotics until results back  START Tamsulosin 0.4mg  daily for BPH-like symptoms to see if helps urinary flow temporarily and may continue this longer term if ultimately further work up is negative and determined BPH is the cause.   Meds ordered this encounter  Medications  . tamsulosin (FLOMAX) 0.4 MG CAPS capsule    Sig: Take 1 capsule (0.4 mg total) by mouth daily after breakfast.    Dispense:  30 capsule    Refill:  1      Follow up plan: Return in about 2 weeks (around 02/09/2020), or if symptoms worsen or fail to improve, for 1-2 weeks follow-up urinary retention high sugar.   Nobie Putnam, [facility] 01/26/2020, 11:27 AM

## 2020-01-28 ENCOUNTER — Encounter: Payer: Self-pay | Admitting: Family Medicine

## 2020-01-28 DIAGNOSIS — E1169 Type 2 diabetes mellitus with other specified complication: Secondary | ICD-10-CM | POA: Insufficient documentation

## 2020-01-28 DIAGNOSIS — E1165 Type 2 diabetes mellitus with hyperglycemia: Secondary | ICD-10-CM | POA: Insufficient documentation

## 2020-01-28 LAB — URINE CULTURE
MICRO NUMBER:: 10347986
Result:: NO GROWTH
SPECIMEN QUALITY:: ADEQUATE

## 2020-01-28 LAB — COMPLETE METABOLIC PANEL WITH GFR
AG Ratio: 1.5 (calc) (ref 1.0–2.5)
ALT: 145 U/L — ABNORMAL HIGH (ref 9–46)
AST: 122 U/L — ABNORMAL HIGH (ref 10–40)
Albumin: 4.4 g/dL (ref 3.6–5.1)
Alkaline phosphatase (APISO): 58 U/L (ref 36–130)
BUN: 11 mg/dL (ref 7–25)
CO2: 25 mmol/L (ref 20–32)
Calcium: 9.7 mg/dL (ref 8.6–10.3)
Chloride: 100 mmol/L (ref 98–110)
Creat: 0.84 mg/dL (ref 0.60–1.35)
GFR, Est African American: 124 mL/min/{1.73_m2} (ref >=60)
GFR, Est Non African American: 107 mL/min/{1.73_m2} (ref >=60)
Globulin: 2.9 g/dL (calc) (ref 1.9–3.7)
Glucose, Bld: 161 mg/dL — ABNORMAL HIGH (ref 65–139)
Potassium: 4.3 mmol/L (ref 3.5–5.3)
Sodium: 138 mmol/L (ref 135–146)
Total Bilirubin: 0.8 mg/dL (ref 0.2–1.2)
Total Protein: 7.3 g/dL (ref 6.1–8.1)

## 2020-01-28 LAB — HEMOGLOBIN A1C
Hgb A1c MFr Bld: 8.9 % of total Hgb — ABNORMAL HIGH (ref <5.7)
Mean Plasma Glucose: 209 (calc)
eAG (mmol/L): 11.6 (calc)

## 2020-01-30 DIAGNOSIS — E1165 Type 2 diabetes mellitus with hyperglycemia: Secondary | ICD-10-CM

## 2020-01-30 DIAGNOSIS — E669 Obesity, unspecified: Secondary | ICD-10-CM

## 2020-01-31 NOTE — Addendum Note (Signed)
Addended by: Olin Hauser on: 01/31/2020 11:00 AM   Modules accepted: Orders

## 2020-02-02 NOTE — Telephone Encounter (Signed)
Letter has been mailed to the patient's home.

## 2020-02-13 ENCOUNTER — Other Ambulatory Visit: Payer: Self-pay

## 2020-02-13 ENCOUNTER — Encounter: Payer: 59 | Attending: Family Medicine | Admitting: *Deleted

## 2020-02-13 ENCOUNTER — Encounter: Payer: Self-pay | Admitting: *Deleted

## 2020-02-13 VITALS — BP 144/94 | Ht 76.0 in | Wt 286.6 lb

## 2020-02-13 DIAGNOSIS — E1165 Type 2 diabetes mellitus with hyperglycemia: Secondary | ICD-10-CM

## 2020-02-13 NOTE — Patient Instructions (Signed)
Check blood sugars 1-2 x day before breakfast and/or 2 hrs after one meal every day Bring blood sugar records to the next class  Call your doctor for a prescription for:  1. Meter strips (type) FreeStyle Lite  checking  1-2 times per day  2. Lancets (type) FreeStyle   checking  1-2   times per day  Exercise: Begin walking for   15  minutes 3 days a week and gradually increase to 30 minutes 5 x week (nothing strenuous until blood sugars less than 250)  Eat 3 meals day,  1-2  snacks a day Space meals 4-6 hours apart Increase water intake Avoid sugar sweetened drinks (juices)  Return for classes on:

## 2020-02-13 NOTE — Progress Notes (Signed)
Diabetes Self-Management Education  Visit Type: First/Initial  Appt. Start Time: 0900 Appt. End Time: 1005  02/13/2020  [name], identified by name and date of birth, is a 44 y.o. male with a diagnosis of Diabetes: Type 2.   ASSESSMENT  Blood pressure (!) 144/94, height 6\' 4"  (1.93 m), weight 286 lb 9.6 oz (130 kg). Body mass index is 34.89 kg/m.  Diabetes Self-Management Education - 02/13/20 1333      Visit Information   Visit Type  First/Initial      Initial Visit   Diabetes Type  Type 2    Are you currently following a meal plan?  Yes    What type of meal plan do you follow?  "low carb, low sugar"    Are you taking your medications as prescribed?  Yes    Date Diagnosed  1 month ago      Health Coping   How would you rate your overall health?  Good      Psychosocial Assessment   Patient Belief/Attitude about Diabetes  Motivated to manage diabetes   "no impact"   Self-care barriers  None    Self-management support  Doctor's office;Family    Patient Concerns  Nutrition/Meal planning;Glycemic Control;Monitoring;Weight Control;Healthy Lifestyle    Special Needs  None    Preferred Learning Style  Visual    Learning Readiness  Change in progress    How often do you need to have someone help you when you read instructions, pamphlets, or other written materials from your doctor or pharmacy?  1 - Never    What is the last grade level you completed in school?  12th      Pre-Education Assessment   Patient understands the diabetes disease and treatment process.  Needs Instruction    Patient understands incorporating nutritional management into lifestyle.  Needs Instruction    Patient undertands incorporating physical activity into lifestyle.  Needs Instruction    Patient understands using medications safely.  Needs Instruction    Patient understands monitoring blood glucose, interpreting and using results  Needs Instruction    Patient understands prevention, detection,  and treatment of acute complications.  Needs Instruction    Patient understands prevention, detection, and treatment of chronic complications.  Needs Instruction    Patient understands how to develop strategies to address psychosocial issues.  Needs Instruction    Patient understands how to develop strategies to promote health/change behavior.  Needs Instruction      Complications   Last HgB A1C per patient/outside source  8.9 %   01/26/2020   How often do you check your blood sugar?  0 times/day (not testing)   Pt's insurance requires a meter that this office does not have. Instructed on demo meter in the office. BG on office meter was 269 mg/dL at 9:50 am - 2 hrs pp.   Have you had a dilated eye exam in the past 12 months?  Yes    Have you had a dental exam in the past 12 months?  No    Are you checking your feet?  No      Dietary Intake   Breakfast  Fruit smoothie - blueberries, banana, strawberries, orange juice, almond milk; bacon, eggs, sausage and toast    Lunch  ham sandwich, SF jello, banana, Greek yogurt, salad    Dinner  chicken, pork, fish, potatoes, peas, green beans, asparagus, rice, pasta, cauliflower, squash, zucchini    Snack (evening)  0-1 snacks/day - Outshine bar (frozen  fruit bar)    Beverage(s)  water with Crystal light, juice, unsweetened tea      Exercise   Exercise Type  ADL's      Patient Education   Previous Diabetes Education  No    Disease state   Definition of diabetes, type 1 and 2, and the diagnosis of diabetes;Factors that contribute to the development of diabetes    Nutrition management   Role of diet in the treatment of diabetes and the relationship between the three main macronutrients and blood glucose level;Food label reading, portion sizes and measuring food.;Reviewed blood glucose goals for pre and post meals and how to evaluate the patients' food intake on their blood glucose level.;Effects of alcohol on blood glucose and safety factors with  consumption of alcohol.    Physical activity and exercise   Role of exercise on diabetes management, blood pressure control and cardiac health.    Monitoring  Taught/evaluated SMBG meter.;Purpose and frequency of SMBG.;Taught/discussed recording of test results and interpretation of SMBG.;Identified appropriate SMBG and/or A1C goals.    Chronic complications  Relationship between chronic complications and blood glucose control    Psychosocial adjustment  Identified and addressed patients feelings and concerns about diabetes      Individualized Goals (developed by patient)   Reducing Risk  Other (comment)   improve blood sugars, prevent diabetes complications, lose weight, lead a healthier lifestyle, become more fit     Outcomes   Expected Outcomes  Demonstrated interest in learning. Expect positive outcomes       Individualized Plan for Diabetes Self-Management Training:   Learning Objective:  Patient will have a greater understanding of diabetes self-management. Patient education plan is to attend individual and/or group sessions per assessed needs and concerns.   Plan:   Patient Instructions  Check blood sugars 1-2 x day before breakfast and/or 2 hrs after one meal every day Bring blood sugar records to the next class Call your doctor for a prescription for:  1. Meter strips (type) FreeStyle Lite  checking  1-2 times per day  2. Lancets (type) FreeStyle   checking  1-2   times per day Exercise: Begin walking for   15  minutes 3 days a week and gradually increase to 30 minutes 5 x week (nothing strenuous until blood sugars less than 250) Eat 3 meals day,  1-2  snacks a day Space meals 4-6 hours apart Increase water intake Avoid sugar sweetened drinks (juices)  Expected Outcomes:  Demonstrated interest in learning. Expect positive outcomes  Education material provided:  General Meal Planning Guidelines Simple Meal Plan  If problems or questions, patient to contact team via:   Johny Drilling, RN, [place], [place] [phone]  Future DSME appointment:  Feb 22, 2020 for Diabetes Class 1

## 2020-02-14 DIAGNOSIS — E1165 Type 2 diabetes mellitus with hyperglycemia: Secondary | ICD-10-CM

## 2020-02-14 MED ORDER — FREESTYLE LITE TEST VI STRP: ORAL_STRIP | 3 refills | Status: AC

## 2020-02-14 MED ORDER — FREESTYLE LITE DEVI: 0 refills | Status: AC

## 2020-02-14 MED ORDER — FREESTYLE LANCETS MISC: 12 refills | Status: AC

## 2020-02-22 ENCOUNTER — Encounter: Payer: 59 | Attending: Family Medicine | Admitting: Dietician

## 2020-02-22 ENCOUNTER — Encounter: Payer: Self-pay | Admitting: Dietician

## 2020-02-22 ENCOUNTER — Other Ambulatory Visit: Payer: Self-pay

## 2020-02-22 DIAGNOSIS — Z713 Dietary counseling and surveillance: Secondary | ICD-10-CM | POA: Diagnosis not present

## 2020-02-22 DIAGNOSIS — E1165 Type 2 diabetes mellitus with hyperglycemia: Secondary | ICD-10-CM | POA: Diagnosis not present

## 2020-02-22 DIAGNOSIS — Z6834 Body mass index (BMI) 34.0-34.9, adult: Secondary | ICD-10-CM | POA: Insufficient documentation

## 2020-02-22 DIAGNOSIS — E669 Obesity, unspecified: Secondary | ICD-10-CM | POA: Diagnosis not present

## 2020-02-22 NOTE — Progress Notes (Signed)

## 2020-02-29 ENCOUNTER — Encounter: Payer: 59 | Admitting: *Deleted

## 2020-02-29 ENCOUNTER — Other Ambulatory Visit: Payer: Self-pay

## 2020-02-29 ENCOUNTER — Encounter: Payer: Self-pay | Admitting: *Deleted

## 2020-02-29 VITALS — Wt 279.0 lb

## 2020-02-29 DIAGNOSIS — E1165 Type 2 diabetes mellitus with hyperglycemia: Secondary | ICD-10-CM | POA: Diagnosis not present

## 2020-02-29 DIAGNOSIS — Z6834 Body mass index (BMI) 34.0-34.9, adult: Secondary | ICD-10-CM | POA: Diagnosis not present

## 2020-02-29 DIAGNOSIS — E669 Obesity, unspecified: Secondary | ICD-10-CM | POA: Diagnosis not present

## 2020-02-29 DIAGNOSIS — Z713 Dietary counseling and surveillance: Secondary | ICD-10-CM | POA: Diagnosis not present

## 2020-02-29 NOTE — Progress Notes (Signed)

## 2020-03-07 ENCOUNTER — Other Ambulatory Visit: Payer: Self-pay

## 2020-03-07 ENCOUNTER — Encounter: Payer: 59 | Admitting: Dietician

## 2020-03-07 ENCOUNTER — Encounter: Payer: Self-pay | Admitting: Dietician

## 2020-03-07 VITALS — BP 150/90 | Ht 76.0 in | Wt 277.2 lb

## 2020-03-07 DIAGNOSIS — E1165 Type 2 diabetes mellitus with hyperglycemia: Secondary | ICD-10-CM

## 2020-03-07 NOTE — Progress Notes (Signed)
Appt. Start Time: 900 Appt. End Time: 1200  Class 3 Diabetes Overview - identify functions of pancreas and insulin; define insulin deficiency vs insulin resistance  Medications - state name, dose, timing of currently prescribed medications; describe types of medications available for diabetes  Psychosocial - identify DM as a source of stress; state the effects of stress on BG control; verbalize appropriate stress management techniques; identify personal stress issues   Nutritional Management - use food labels to identify serving size, content of carbohydrate, fiber, protein, fat, saturated fat and sodium; recognize food sources of fat, saturated fat, trans fat, and sodium, and verbalize goals for intake; describe healthful, appropriate food choices when dining out   Exercise - state a plan for personal exercise; verbalize contraindications for exercise  Self-Monitoring - state importance of SMBG; use SMBG results to effectively manage diabetes; identify importance of regular HbA1C testing and goals for results  Acute Complications - recognize hyperglycemia and hypoglycemia with causes and effects; identify blood glucose results as high, low or in control; list steps in treating and preventing high and low blood glucose  Chronic Complications - state importance of daily self-foot exams; explain appropriate eye and dental care  Lifestyle Changes/Goals & Health/Community Resources - set goals for proper diabetes care; state need for and frequency of healthcare follow-up; describe appropriate community resources for good health (ADA, web sites, apps)   Teaching Materials Used: Class 3 Slide Packet Diabetes Stress Test Stress Management Tools Stress Poem Goal Setting Worksheet Website/App List    

## 2020-03-08 ENCOUNTER — Encounter: Payer: Self-pay | Admitting: *Deleted

## 2020-05-22 ENCOUNTER — Encounter: Payer: Self-pay | Admitting: Family Medicine

## 2020-05-27 ENCOUNTER — Telehealth: Payer: Self-pay

## 2020-05-27 NOTE — Telephone Encounter (Signed)
Please review

## 2020-05-27 NOTE — Telephone Encounter (Signed)
Patient brother just got diagnosed with rectal cancer. His brother doctor wanted all his brothers to get a colonoscopy due to him having rectal cancer. Made patient a appointment on 08/19/2020 but they were seeing if he could have the procedure before appointment

## 2020-05-27 NOTE — Telephone Encounter (Signed)
If has no symptoms can have procedure with no office visit

## 2020-05-29 ENCOUNTER — Other Ambulatory Visit: Payer: Self-pay

## 2020-05-29 DIAGNOSIS — Z800 Family history of malignant neoplasm of digestive organs: Secondary | ICD-10-CM

## 2020-05-29 MED ORDER — NA SULFATE-K SULFATE-MG SULF 17.5-3.13-1.6 GM/177ML PO SOLN: 1.0000 | Freq: Once | ORAL | 0 refills | Status: AC

## 2020-06-19 ENCOUNTER — Other Ambulatory Visit: Payer: Self-pay

## 2020-06-19 ENCOUNTER — Other Ambulatory Visit
Admission: RE | Admit: 2020-06-19 | Discharge: 2020-06-19 | Disposition: A | Discharge status: Home / Self Care | Payer: 59 | Source: Ambulatory Visit | Attending: Gastroenterology | Admitting: Gastroenterology

## 2020-06-19 DIAGNOSIS — Z20822 Contact with and (suspected) exposure to covid-19: Secondary | ICD-10-CM | POA: Insufficient documentation

## 2020-06-19 DIAGNOSIS — Z01812 Encounter for preprocedural laboratory examination: Secondary | ICD-10-CM | POA: Insufficient documentation

## 2020-06-19 LAB — SARS CORONAVIRUS 2 (TAT 6-24 HRS): SARS Coronavirus 2: NEGATIVE

## 2020-06-20 ENCOUNTER — Encounter: Payer: Self-pay | Admitting: Gastroenterology

## 2020-06-21 ENCOUNTER — Other Ambulatory Visit: Payer: Self-pay

## 2020-06-21 ENCOUNTER — Encounter: Payer: Self-pay | Admitting: Gastroenterology

## 2020-06-21 ENCOUNTER — Ambulatory Visit: Payer: 59 | Admitting: Anesthesiology

## 2020-06-21 ENCOUNTER — Ambulatory Visit
Admission: RE | Admit: 2020-06-21 | Discharge: 2020-06-21 | Disposition: A | Discharge status: Home / Self Care | Payer: 59 | Attending: Gastroenterology | Admitting: Gastroenterology

## 2020-06-21 ENCOUNTER — Encounter
Admission: RE | Disposition: A | Discharge status: Home / Self Care | Payer: Self-pay | Source: Home / Self Care | Attending: Gastroenterology

## 2020-06-21 DIAGNOSIS — Z833 Family history of diabetes mellitus: Secondary | ICD-10-CM | POA: Diagnosis not present

## 2020-06-21 DIAGNOSIS — Z800 Family history of malignant neoplasm of digestive organs: Secondary | ICD-10-CM | POA: Insufficient documentation

## 2020-06-21 DIAGNOSIS — Z888 Allergy status to other drugs, medicaments and biological substances status: Secondary | ICD-10-CM | POA: Diagnosis not present

## 2020-06-21 DIAGNOSIS — D124 Benign neoplasm of descending colon: Secondary | ICD-10-CM | POA: Diagnosis not present

## 2020-06-21 DIAGNOSIS — Z91030 Bee allergy status: Secondary | ICD-10-CM | POA: Diagnosis not present

## 2020-06-21 DIAGNOSIS — D125 Benign neoplasm of sigmoid colon: Secondary | ICD-10-CM | POA: Insufficient documentation

## 2020-06-21 DIAGNOSIS — D128 Benign neoplasm of rectum: Secondary | ICD-10-CM | POA: Diagnosis not present

## 2020-06-21 DIAGNOSIS — Z86010 Personal history of colonic polyps: Secondary | ICD-10-CM | POA: Insufficient documentation

## 2020-06-21 DIAGNOSIS — Z91018 Allergy to other foods: Secondary | ICD-10-CM | POA: Insufficient documentation

## 2020-06-21 DIAGNOSIS — K635 Polyp of colon: Secondary | ICD-10-CM | POA: Diagnosis not present

## 2020-06-21 DIAGNOSIS — E119 Type 2 diabetes mellitus without complications: Secondary | ICD-10-CM | POA: Insufficient documentation

## 2020-06-21 DIAGNOSIS — Z1211 Encounter for screening for malignant neoplasm of colon: Secondary | ICD-10-CM | POA: Diagnosis not present

## 2020-06-21 DIAGNOSIS — K219 Gastro-esophageal reflux disease without esophagitis: Secondary | ICD-10-CM | POA: Insufficient documentation

## 2020-06-21 HISTORY — PX: COLONOSCOPY WITH PROPOFOL: SHX5780

## 2020-06-21 LAB — GLUCOSE, CAPILLARY: Glucose-Capillary: 108 mg/dL — ABNORMAL HIGH (ref 70–99)

## 2020-06-21 SURGERY — COLONOSCOPY WITH PROPOFOL
Anesthesia: General

## 2020-06-21 MED ORDER — MIDAZOLAM HCL 2 MG/2ML IJ SOLN
INTRAMUSCULAR | Status: DC | PRN
  Administered 2020-06-21: 2 mg via INTRAVENOUS

## 2020-06-21 MED ORDER — PROPOFOL 10 MG/ML IV BOLUS
INTRAVENOUS | Status: DC | PRN
  Administered 2020-06-21: 30 mg via INTRAVENOUS
  Administered 2020-06-21: 20 mg via INTRAVENOUS
  Administered 2020-06-21: 70 mg via INTRAVENOUS

## 2020-06-21 MED ORDER — GLYCOPYRROLATE 0.2 MG/ML IJ SOLN
INTRAMUSCULAR | Status: DC | PRN
  Administered 2020-06-21: .2 mg via INTRAVENOUS

## 2020-06-21 MED ORDER — PROPOFOL 500 MG/50ML IV EMUL
INTRAVENOUS | Status: DC | PRN
  Administered 2020-06-21: 165 ug/kg/min via INTRAVENOUS

## 2020-06-21 MED ORDER — LIDOCAINE HCL (CARDIAC) PF 100 MG/5ML IV SOSY
PREFILLED_SYRINGE | INTRAVENOUS | Status: DC | PRN
  Administered 2020-06-21: 100 mg via INTRAVENOUS

## 2020-06-21 MED ORDER — MIDAZOLAM HCL 2 MG/2ML IJ SOLN
INTRAMUSCULAR | Status: AC
  Filled 2020-06-21: qty 2

## 2020-06-21 MED ORDER — SODIUM CHLORIDE 0.9 % IV SOLN: INTRAVENOUS | Status: DC

## 2020-06-21 MED ORDER — PROPOFOL 500 MG/50ML IV EMUL
INTRAVENOUS | Status: AC
  Filled 2020-06-21: qty 450

## 2020-06-21 NOTE — Anesthesia Preprocedure Evaluation (Signed)
Anesthesia Evaluation  Patient identified by MRN, date of birth, ID band Patient awake    Reviewed: Allergy & Precautions, NPO status , Patient's Chart, lab work & pertinent test results  History of Anesthesia Complications Negative for: history of anesthetic complications  Airway Mallampati: II       Dental   Pulmonary neg sleep apnea, neg COPD, Not current smoker,           Cardiovascular (-) hypertension(-) Past MI and (-) CHF (-) dysrhythmias (-) Valvular Problems/Murmurs     Neuro/Psych neg Seizures    GI/Hepatic Neg liver ROS, GERD  Medicated and Controlled,  Endo/Other  diabetes (diet controlled), Type 2  Renal/GU negative Renal ROS     Musculoskeletal   Abdominal   Peds  Hematology   Anesthesia Other Findings   Reproductive/Obstetrics                             Anesthesia Physical Anesthesia Plan  ASA: II  Anesthesia Plan: General   Post-op Pain Management:    Induction: Intravenous  PONV Risk Score and Plan: 2 and Propofol infusion and TIVA  Airway Management Planned: Nasal Cannula  Additional Equipment:   Intra-op Plan:   Post-operative Plan:   Informed Consent: I have reviewed the patients History and Physical, chart, labs and discussed the procedure including the risks, benefits and alternatives for the proposed anesthesia with the patient or authorized representative who has indicated his/her understanding and acceptance.       Plan Discussed with:   Anesthesia Plan Comments:         Anesthesia Quick Evaluation

## 2020-06-21 NOTE — Anesthesia Procedure Notes (Signed)
Procedure Name: General with mask airway Performed by: Fletcher-Harrison, Evola Hollis, CRNA Pre-anesthesia Checklist: Patient identified, Emergency Drugs available, Suction available and Patient being monitored Patient Re-evaluated:Patient Re-evaluated prior to induction Oxygen Delivery Method: Simple face mask Induction Type: IV induction Placement Confirmation: positive ETCO2 and CO2 detector Dental Injury: Teeth and Oropharynx as per pre-operative assessment        

## 2020-06-21 NOTE — Op Note (Signed)
[facility] Gastroenterology Patient Name: Tanner Frye Procedure Date: 06/21/2020 8:20 AM MRN: [id number] Account #: [id number] Date of Birth: 01/03/1976 Admit Type: Outpatient Age: 44 Room: [facility] ENDO ROOM 4 Gender: Male Note Status: Finalized Procedure:             Colonoscopy Indications:           High risk colon cancer surveillance: Personal history                         of colonic polyps, Last colonoscopy: July 2018 Providers:             Jonathon Bellows MD, MD Referring MD:          Olin Hauser (Referring MD) Medicines:             Monitored Anesthesia Care Complications:         No immediate complications. Procedure:             Pre-Anesthesia Assessment:                        - Prior to the procedure, a History and Physical was                         performed, and patient medications, allergies and                         sensitivities were reviewed. The patient's tolerance                         of previous anesthesia was reviewed.                        - The risks and benefits of the procedure and the                         sedation options and risks were discussed with the                         patient. All questions were answered and informed                         consent was obtained.                        - ASA Grade Assessment: II - A patient with mild                         systemic disease.                        After obtaining informed consent, the colonoscope was                         passed under direct vision. Throughout the procedure,                         the patient's blood pressure, pulse, and oxygen                         saturations were monitored  continuously. The                         Colonoscope was introduced through the anus and                         advanced to the the terminal ileum, with                         identification of the appendiceal orifice. The                         colonoscopy was  performed with ease. The patient                         tolerated the procedure well. The quality of the bowel                         preparation was excellent. Findings:      The perianal and digital rectal examinations were normal.      Three sessile polyps were found in the sigmoid colon and descending       colon. The polyps were 4 to 5 mm in size. These polyps were removed with       a cold snare. Resection and retrieval were complete.      A 5 mm polyp was found in the rectum. The polyp was sessile. The polyp       was removed with a cold snare. Resection and retrieval were complete.      The exam was otherwise without abnormality on direct and retroflexion       views. Impression:            - Three 4 to 5 mm polyps in the sigmoid colon and in                         the descending colon, removed with a cold snare.                         Resected and retrieved.                        - One 5 mm polyp in the rectum, removed with a cold                         snare. Resected and retrieved.                        - The examination was otherwise normal on direct and                         retroflexion views. Recommendation:        - Discharge patient to home (with escort).                        - Resume previous diet.                        - Continue present medications.                        -  Await pathology results.                        - Repeat colonoscopy for surveillance based on                         pathology results. Procedure Code(s):     --- Professional ---                        [phone], Colonoscopy, flexible; with removal of                         tumor(s), polyp(s), or other lesion(s) by snare                         technique Diagnosis Code(s):     --- Professional ---                        Z86.010, Personal history of colonic polyps                        K63.5, Polyp of colon                        K62.1, Rectal polyp CPT copyright 2019 American  Medical Association. All rights reserved. The codes documented in this report are preliminary and upon coder review may  be revised to meet current compliance requirements. Jonathon Bellows, MD Jonathon Bellows MD, MD 06/21/2020 8:50:14 AM This report has been signed electronically. Number of Addenda: 0 Note Initiated On: 06/21/2020 8:20 AM Scope Withdrawal Time: 0 hours 15 minutes 53 seconds  Total Procedure Duration: 0 hours 18 minutes 9 seconds  Estimated Blood Loss:  Estimated blood loss: none.      [facility]

## 2020-06-21 NOTE — H&P (Signed)
Jonathon Bellows, MD [address] [place], [place], [place], [address] [address], [place], [place], [place], [address] Phone: [phone]  Fax: [phone]  Primary Care Physician:  Olin Hauser, DO   Pre-Procedure History & Physical: HPI:  Tanner Frye is a 44 y.o. male is here for an colonoscopy.   Past Medical History:  Diagnosis Date  . Anal fissure   . Diabetes mellitus without complication ([place])   . GERD (gastroesophageal reflux disease)     Past Surgical History:  Procedure Laterality Date  . COLONOSCOPY WITH PROPOFOL N/A 04/27/2017   Procedure: COLONOSCOPY WITH PROPOFOL;  Surgeon: Jonathon Bellows, MD;  Location: [facility] ENDOSCOPY;  Service: Endoscopy;  Laterality: N/A;  . [place]    Prior to Admission medications   Medication Sig Start Date End Date Taking? Authorizing Provider  Ascorbic Acid (VITAMIN C) 1000 MG tablet Take 1,000 mg by mouth daily.    [provider]  Blood Glucose Monitoring Suppl (FREESTYLE LITE) DEVI Use to check blood sugar up to twice a day as advised. 02/14/20   Karamalegos, Alexander J, DO  EPIPEN 2-PAK 0.3 MG/0.3ML SOAJ injection INJ 0.3 ML IM ONCE FOR 1 DOSE 03/07/18   [provider]  FREESTYLE LITE test strip Use to check blood sugar up to twice a day as advised. 02/14/20   Karamalegos, Devonne Doughty, DO  ibuprofen (ADVIL,MOTRIN) 100 MG tablet Take 200 mg by mouth every 6 (six) hours as needed for fever.     [provider]  Lancets (FREESTYLE) lancets Use to check blood sugar up to twice a day as advised. 02/14/20   Karamalegos, Devonne Doughty, DO  Multiple Vitamin (MULTIVITAMIN) tablet Take 1 tablet by mouth daily.    [provider]  naproxen (NAPROSYN) 500 MG tablet TAKE 1 TABLET BY MOUTH TWICE DAILY FOR 5 DAYS, THEN MAY USE TWICE DAILY AS NEEDED AFTER THAT Patient not taking: Reported on 11/27/2019 08/08/17   McKeag, Marylynn Pearson, MD  omeprazole (PRILOSEC) 20 MG capsule Take 20 mg by mouth daily.     [provider]  tamsulosin (FLOMAX) 0.4 MG CAPS capsule Take 1 capsule (0.4 mg total) by mouth daily after breakfast. Patient not taking: Reported on 02/13/2020 01/26/20   Olin Hauser, DO    Allergies as of 05/29/2020 - Review Complete 03/07/2020  Allergen Reaction Noted  . Bee venom  01/16/2014  . Oatmeal Swelling 02/13/2020  . Other Swelling 02/13/2020  . Prednisone Other (See Comments) 05/23/2013    Family History  Problem Relation Age of Onset  . Hypertension Mother   . Hyperlipidemia Mother   . Diabetes Mother   . Colon cancer Paternal Aunt   . Colon cancer Paternal Uncle   . Colon cancer Brother 66  . Prostate cancer Neg Hx     Social History   Socioeconomic History  . Marital status: Married    Spouse name: Not on file  . Number of children: Not on file  . Years of education: Vocational Degree  . Highest education level: Associate degree: occupational, Hotel manager, or vocational program  Occupational History  . Not on file  Tobacco Use  . Smoking status: Never Smoker  . Smokeless tobacco: Never Used  Vaping Use  . Vaping Use: Never used  Substance and Sexual Activity  . Alcohol use: Yes    Alcohol/week: 5.0 standard drinks    Types: 5 Cans of beer per week    Comment: social  . Drug use: No  .  Sexual activity: Yes    Partners: Female  Other Topics Concern  . Not on file  Social History Narrative  . Not on file   Social Determinants of Health   Financial Resource Strain:   . Difficulty of Paying Living Expenses: Not on file  Food Insecurity:   . Worried About Charity fundraiser in the Last Year: Not on file  . Ran Out of Food in the Last Year: Not on file  Transportation Needs:   . Lack of Transportation (Medical): Not on file  . Lack of Transportation (Non-Medical): Not on file  Physical Activity:   . Days of Exercise per Week: Not on file  . Minutes of Exercise per Session: Not on file  Stress:   . Feeling of Stress : Not on  file  Social Connections:   . Frequency of Communication with Friends and Family: Not on file  . Frequency of Social Gatherings with Friends and Family: Not on file  . Attends Religious Services: Not on file  . Active Member of Clubs or Organizations: Not on file  . Attends Archivist Meetings: Not on file  . Marital Status: Not on file  Intimate Partner Violence:   . Fear of Current or Ex-Partner: Not on file  . Emotionally Abused: Not on file  . Physically Abused: Not on file  . Sexually Abused: Not on file    Review of Systems: See HPI, otherwise negative ROS  Physical Exam: BP (!) 131/91   Pulse 81   Temp (!) 97.5 F (36.4 C) (Tympanic)   Resp 18   Ht 6\' 4"  (1.93 m)   Wt 127 kg   SpO2 98%   BMI 34.08 kg/m  General:   Alert,  pleasant and cooperative in NAD Head:  Normocephalic and atraumatic. Neck:  Supple; no masses or thyromegaly. Lungs:  Clear throughout to auscultation, normal respiratory effort.    Heart:  +S1, +S2, Regular rate and rhythm, No edema. Abdomen:  Soft, nontender and nondistended. Normal bowel sounds, without guarding, and without rebound.   Neurologic:  Alert and  oriented x4;  grossly normal neurologically.  Impression/Plan: Tanner Frye is here for an colonoscopy to be performed for surveillance due to prior history of colon polyps   Risks, benefits, limitations, and alternatives regarding  colonoscopy have been reviewed with the patient.  Questions have been answered.  All parties agreeable.   Jonathon Bellows, MD  06/21/2020, 8:26 AM

## 2020-06-21 NOTE — Transfer of Care (Signed)
Immediate Anesthesia Transfer of Care Note  Patient: Tanner Frye  Procedure(s) Performed: COLONOSCOPY WITH PROPOFOL (N/A )  Patient Location: Endoscopy Unit  Anesthesia Type:General  Level of Consciousness: drowsy and responds to stimulation  Airway & Oxygen Therapy: Patient Spontanous Breathing and Patient connected to face mask oxygen  Post-op Assessment: Report given to RN and Post -op Vital signs reviewed and stable  Post vital signs: Reviewed and stable  Last Vitals:  Vitals Value Taken Time  BP 103/58 06/21/20 0852  Temp    Pulse 92 06/21/20 0852  Resp 14 06/21/20 0852  SpO2 95 % 06/21/20 0852    Last Pain:  Vitals:   06/21/20 0754  TempSrc: Tympanic  PainSc: 0-No pain         Complications: No complications documented.

## 2020-06-21 NOTE — Anesthesia Postprocedure Evaluation (Signed)
Anesthesia Post Note  Patient: Tanner Frye  Procedure(s) Performed: COLONOSCOPY WITH PROPOFOL (N/A )  Patient location during evaluation: Endoscopy Anesthesia Type: General Level of consciousness: awake and alert Pain management: pain level controlled Vital Signs Assessment: post-procedure vital signs reviewed and stable Respiratory status: spontaneous breathing and respiratory function stable Cardiovascular status: stable Anesthetic complications: no   No complications documented.   Last Vitals:  Vitals:   06/21/20 0910 06/21/20 0920  BP: 120/77 133/89  Pulse: 77 68  Resp: 13 11  Temp:    SpO2: 96% 98%    Last Pain:  Vitals:   06/21/20 0852  TempSrc: Tympanic  PainSc:                  Dennard Nip K

## 2020-06-23 ENCOUNTER — Encounter: Payer: Self-pay | Admitting: Gastroenterology

## 2020-06-25 LAB — SURGICAL PATHOLOGY

## 2020-07-03 ENCOUNTER — Encounter: Payer: Self-pay | Admitting: Gastroenterology

## 2020-08-01 ENCOUNTER — Encounter: Payer: Self-pay | Admitting: Family Medicine

## 2020-08-01 ENCOUNTER — Other Ambulatory Visit (INDEPENDENT_AMBULATORY_CARE_PROVIDER_SITE_OTHER): Payer: 59 | Admitting: Family Medicine

## 2020-08-01 ENCOUNTER — Other Ambulatory Visit: Payer: Self-pay

## 2020-08-01 ENCOUNTER — Ambulatory Visit (INDEPENDENT_AMBULATORY_CARE_PROVIDER_SITE_OTHER): Payer: 59 | Admitting: Family Medicine

## 2020-08-01 VITALS — BP 134/92 | HR 73 | Temp 98.4°F | Resp 18 | Ht 76.0 in | Wt 264.6 lb

## 2020-08-01 DIAGNOSIS — E1165 Type 2 diabetes mellitus with hyperglycemia: Secondary | ICD-10-CM | POA: Diagnosis not present

## 2020-08-01 DIAGNOSIS — Z024 Encounter for examination for driving license: Secondary | ICD-10-CM | POA: Insufficient documentation

## 2020-08-01 LAB — POCT GLYCOSYLATED HEMOGLOBIN (HGB A1C): Hemoglobin A1C: 6 % — AB (ref 4.0–5.6)

## 2020-08-01 NOTE — Progress Notes (Signed)
Needed A1C, here in clinic for DOT PE

## 2020-08-01 NOTE — Patient Instructions (Signed)
DOT certificate provided x 1 year

## 2020-08-01 NOTE — Assessment & Plan Note (Addendum)
DOT Certificate provided x 1 year  Hearing test: Pass at 15' Vision: 20/20 R, 20/20 L, 20/15 Both Corrected  Urine 1.020, Neg Protein, Neg Glucose, Neg Hematuria  A1C 6.0% STOPBANG: 1/8

## 2020-08-01 NOTE — Progress Notes (Signed)
Subjective:    Patient ID: Tanner Frye, male    DOB: 1975-12-18, 44 y.o.   MRN: [id number]  Tanner UHDE is a 44 y.o. male presenting on 08/01/2020 for Employment Physical   HPI  Mr. Vanderveer presents to clinic for DOT PE  Depression screen [facility] 2/9 02/13/2020 01/26/2020 04/03/2019  Decreased Interest 0 0 0  Down, Depressed, Hopeless 0 0 0  PHQ - 2 Score 0 0 0    Social History   Tobacco Use  . Smoking status: Never Smoker  . Smokeless tobacco: Never Used  Vaping Use  . Vaping Use: Never used  Substance Use Topics  . Alcohol use: Yes    Alcohol/week: 5.0 standard drinks    Types: 5 Cans of beer per week    Comment: social  . Drug use: No    Review of Systems  Constitutional: Negative.   HENT: Negative.   Eyes: Negative.   Respiratory: Negative.   Cardiovascular: Negative.   Gastrointestinal: Negative.   Endocrine: Negative.   Genitourinary: Negative.   Musculoskeletal: Negative.   Skin: Negative.   Allergic/Immunologic: Negative.   Neurological: Negative.   Hematological: Negative.   Psychiatric/Behavioral: Negative.    Per HPI unless specifically indicated above     Objective:    BP (!) 134/92 (BP Location: Left Arm, Patient Position: Sitting, Cuff Size: Large)   Pulse 73   Temp 98.4 F (36.9 C) (Oral)   Resp 18   Ht 6\' 4"  (1.93 m)   Wt 264 lb 9.6 oz (120 kg)   BMI 32.21 kg/m   Wt Readings from Last 3 Encounters:  08/01/20 264 lb 9.6 oz (120 kg)  06/21/20 280 lb (127 kg)  03/07/20 277 lb 3.2 oz (125.7 kg)    Physical Exam Vitals reviewed.  Constitutional:      General: He is not in acute distress.    Appearance: Normal appearance. He is well-developed and well-groomed. He is obese. He is not ill-appearing or toxic-appearing.  HENT:     Head: Normocephalic.     Right Ear: Tympanic membrane, ear canal and external ear normal. There is no impacted cerumen.     Left Ear: Tympanic membrane, ear canal and external ear normal. There is no impacted  cerumen.     Nose: Nose normal. No congestion or rhinorrhea.     Mouth/Throat:     Mouth: Mucous membranes are moist.     Pharynx: Oropharynx is clear. No oropharyngeal exudate or posterior oropharyngeal erythema.  Eyes:     General: Lids are normal. Vision grossly intact. No scleral icterus.       Right eye: No discharge.        Left eye: No discharge.     Extraocular Movements: Extraocular movements intact.     Conjunctiva/sclera: Conjunctivae normal.     Pupils: Pupils are equal, round, and reactive to light.  Cardiovascular:     Rate and Rhythm: Normal rate and regular rhythm.     Pulses: Normal pulses.          Dorsalis pedis pulses are 2+ on the right side and 2+ on the left side.     Heart sounds: Normal heart sounds. No murmur heard.  No friction rub. No gallop.   Pulmonary:     Effort: Pulmonary effort is normal. No respiratory distress.     Breath sounds: Normal breath sounds. No wheezing, rhonchi or rales.  Abdominal:     General: Abdomen is flat. Bowel sounds  are normal. There is no distension.     Palpations: Abdomen is soft. There is no hepatomegaly, splenomegaly or mass.     Tenderness: There is no abdominal tenderness. There is no right CVA tenderness, left CVA tenderness, guarding or rebound.     Hernia: No hernia is present.  Musculoskeletal:        General: Normal range of motion.     Cervical back: Normal range of motion and neck supple. No rigidity or tenderness.     Right lower leg: No edema.     Left lower leg: No edema.     Comments: Normal tone, 5/5 strength BUE & BLE  Feet:     Right foot:     Skin integrity: Skin integrity normal.     Left foot:     Skin integrity: Skin integrity normal.  Lymphadenopathy:     Cervical: No cervical adenopathy.  Skin:    General: Skin is warm and dry.     Capillary Refill: Capillary refill takes less than 2 seconds.  Neurological:     General: No focal deficit present.     Mental Status: He is alert and oriented to  person, place, and time.     Cranial Nerves: Cranial nerves are intact. No cranial nerve deficit.     Sensory: Sensation is intact. No sensory deficit.     Motor: Motor function is intact. No weakness.     Coordination: Coordination is intact. Coordination normal.     Gait: Gait is intact. Gait normal.     Deep Tendon Reflexes: Reflexes are normal and symmetric. Reflexes normal.  Psychiatric:        Attention and Perception: Attention and perception normal.        Mood and Affect: Mood and affect normal.        Speech: Speech normal.        Behavior: Behavior normal. Behavior is cooperative.        Thought Content: Thought content normal.        Cognition and Memory: Cognition and memory normal.        Judgment: Judgment normal.    Results for orders placed or performed during the hospital encounter of 06/21/20  Glucose, capillary  Result Value Ref Range   Glucose-Capillary 108 (H) 70 - 99 mg/dL  Surgical pathology  Result Value Ref Range   SURGICAL PATHOLOGY      SURGICAL PATHOLOGY CASE: [id number] PATIENT: Davonn Kaps Surgical Pathology Report     Specimen Submitted: A. Colon polyp x3, descending/sigmoid; cs B. Rectum polyp; cold snare  Clinical History: Family history of colon cancer Z80.0.  Polyp.    DIAGNOSIS: A. COLON POLYPS X3, DESCENDING/SIGMOID; COLD SNARE: - FRAGMENTS (X3) OF TUBULAR ADENOMAS. - NEGATIVE FOR HIGH-GRADE DYSPLASIA AND MALIGNANCY.  B. RECTAL POLYP; COLD SNARE: - TUBULAR ADENOMA. - NEGATIVE FOR HIGH-GRADE DYSPLASIA AND MALIGNANCY.  GROSS DESCRIPTION: A. Labeled: Cold snare descending/sigmoid colon polyp x3 Received: Formalin Tissue fragment(s): Multiple Size: Aggregate, 1.1 x 0.4 x 0.2 cm Description: Tan soft tissue fragments Entirely submitted in 1 cassette.  B. Labeled: Cold snare rectum polyp Received: Formalin Tissue fragment(s): 2 Size: Range from 0.2-0.3 cm Description: Tan soft tissue fragments Entirely submitted in 1  cassette.   Final Diagnosis performed by Allena Napoleon,  MD.   Electronically signed 06/25/2020 11:01:28AM The electronic signature indicates that the named Attending Pathologist has evaluated the specimen Technical component performed at [facility], [address] [place], [place] [address] Lab: [phone] Dir: Rush Farmer,  MD, MMM  Professional component performed at [facility], [facility], [place], [place], [place] [address] Lab: [phone] Dir: Dellia Nims. Rubinas, MD       Assessment & Plan:   Problem List Items Addressed This Visit      Other   Encounter for Department of Transportation (DOT) examination for trucking license - Primary    DOT Certificate provided x 1 year  Hearing test: Pass at 15' Vision: 20/20 R, 20/20 L, 20/15 Both Corrected  Urine 1.020, Neg Protein, Neg Glucose, Neg Hematuria  A1C 6.0% STOPBANG: 1/8          No orders of the defined types were placed in this encounter.  Follow up plan: Return in about 1 year (around 08/01/2021) for DOT PE.   Harlin Rain, [place] Family Nurse Practitioner [facility] 08/01/2020, 9:44 AM

## 2020-08-05 ENCOUNTER — Ambulatory Visit (INDEPENDENT_AMBULATORY_CARE_PROVIDER_SITE_OTHER): Payer: 59 | Admitting: Family Medicine

## 2020-08-05 ENCOUNTER — Other Ambulatory Visit: Payer: Self-pay

## 2020-08-05 ENCOUNTER — Encounter: Payer: Self-pay | Admitting: Family Medicine

## 2020-08-05 VITALS — BP 142/90 | HR 67 | Temp 97.7°F | Resp 16 | Ht 76.0 in | Wt 270.0 lb

## 2020-08-05 DIAGNOSIS — Z23 Encounter for immunization: Secondary | ICD-10-CM

## 2020-08-05 DIAGNOSIS — I10 Essential (primary) hypertension: Secondary | ICD-10-CM | POA: Diagnosis not present

## 2020-08-05 DIAGNOSIS — E1142 Type 2 diabetes mellitus with diabetic polyneuropathy: Secondary | ICD-10-CM

## 2020-08-05 DIAGNOSIS — E1169 Type 2 diabetes mellitus with other specified complication: Secondary | ICD-10-CM | POA: Diagnosis not present

## 2020-08-05 DIAGNOSIS — E669 Obesity, unspecified: Secondary | ICD-10-CM | POA: Diagnosis not present

## 2020-08-05 DIAGNOSIS — E785 Hyperlipidemia, unspecified: Secondary | ICD-10-CM | POA: Diagnosis not present

## 2020-08-05 DIAGNOSIS — E66811 Obesity, class 1: Secondary | ICD-10-CM

## 2020-08-05 DIAGNOSIS — E1165 Type 2 diabetes mellitus with hyperglycemia: Secondary | ICD-10-CM | POA: Diagnosis not present

## 2020-08-05 MED ORDER — LOSARTAN POTASSIUM 50 MG PO TABS: 50.0000 mg | ORAL_TABLET | Freq: Every day | ORAL | 2 refills | Status: DC

## 2020-08-05 NOTE — Assessment & Plan Note (Signed)
Dramatic improved J4I 6.0 Complications - peripheral neuropathy, hyperlipidemia, GERD, increases risk of future cardiovascular complications   Plan:  1. Not on therapy currently - on diet / lifestyle control 2. Encourage improved lifestyle - low carb, low sugar diet, reduce portion size, continue improving regular exercise 3. Check CBG , bring log to next visit for review 4. Continue ARB, - future Statin 5. DM Foot exam done today / Advised to schedule DM ophtho exam, send record - he can return to previous eye doctor Dr Mallie Mussel in [facility] but needs to tell them he is diabetic.  4 week BMET on Losartan ARB new start, and then 4 month follow-up DM A1c

## 2020-08-05 NOTE — Assessment & Plan Note (Signed)
Uncontrolled cholesterol  Plan: 1. Future consider Statin therapy Encourage improved lifestyle - low carb/cholesterol, reduce portion size, continue improving regular exercise

## 2020-08-05 NOTE — Assessment & Plan Note (Addendum)
Mildly elevated initial BP - Home BP readings limited but prior somewhat elevated  No known complications     Plan:  1. START Losartan 50mg  daily for HTN and renal protection in DM - counseling on risk of possible angioedema and precautions. 2. Encourage improved lifestyle - low sodium diet, restart regular exercise 3. Start monitor BP outside office, bring readings to next visit, if persistently >140/90 or new symptoms notify office sooner  Check BMET within 4 weeks for Creatinine, K, will notify results on mychart.

## 2020-08-05 NOTE — Patient Instructions (Addendum)
Thank you for coming to the office today.  Please call Dr Mallie Mussel in [facility] for scheduling a Diabetic Eye Exam (dilated, they need to know diagnosis) and they can send [place] a copy via fax [phone].  Check BP at home, goal is < 140/90.  New BP medication to protect kidneys - Losartan 50mg  once daily.  - Precaution given with potential risk of swelling reaction face / lip / throat - can be severe allergy, rare occurrence, stop med and seek help sooner if this happens.  Start Gabapentin 100mg  capsules, take at night for 2-3 nights only, and then increase to 2 times a day for a few days, and then may increase to 3 times a day, it may make you drowsy, if helps significantly at night only, then you can increase instead to 3 capsules at night, instead of 3 times a day - In the future if needed, we can significantly increase the dose if tolerated well, some common doses are 300mg  three times a day up to 600mg  three times a day, usually it takes several weeks or months to get to higher doses  A1c goal is < 7  Currently at 6.0  Sugar readings should average 100-120 if we are under perfect control. However, it is very acceptable and no problem if < 150 on average.  If consistently higher than 150 - then we need to figure out plan B.  DUE for NOn FASTING BLOOD WORK   SCHEDULE "Lab Only" visit in the morning at the clinic for lab draw in [place]   - Make sure Lab Only appointment is at about 1 week before your next appointment, so that results will be available  For Lab Results, once available within 2-3 days of blood draw, you can can log in to MyChart online to view your results and a brief explanation. Also, we can discuss results at next follow-up visit.    Please schedule a Follow-up Appointment to: Return in about 4 months (around 12/06/2020) for 4 weeks from now non fasting lab only chemistry check / then 4 month DM A1c.  If you have any other questions or concerns, please feel free to  call the office or send a message through [place]. You may also schedule an earlier appointment if necessary.  Additionally, you may be receiving a survey about your experience at our office within a few days to 1 week by e-mail or mail. We value your feedback.  Nobie Putnam, DO [facility]

## 2020-08-05 NOTE — Progress Notes (Signed)
Subjective:    Patient ID: Tanner Frye, male    DOB: Feb 07, 1976, 44 y.o.   MRN: [id number]  Tanner Frye is a 44 y.o. 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 He has done diabetic education classes CBGs: avg 100-120s, AM usually 120-140, highest < 150 Meds: None Currently not on ACEi / ARB Lifestyle: - Diet (improving low carb low sugar)  Previous  Admits some newer nerve problems in feet - pins and needles and tingling and "spasm" usually worse in evening can cause some pain. Also has some RLS symptoms usually in evening, if sitting down or laying with feet up.  Denies hypoglycemia, polyuria, visual changes, numbness or tingling.  HYPERLIPIDEMIA / Elevated LFTs. - Reports no concerns. Last lipid panel 03/2019, improvement in lipids, reduced total chol LDL TG Last lab showed mild improved LFTs still elevated however. Not on any cholesterol medication   Health Maintenance:  Due for Flu Shot, will receive today   Updated COVID19 booster recently.  Colonoscopy 06/2020 next 3 year for repeat, had precancerous polyp, he has strong fam history colon cancer and he is interested to return sooner for colonoscopy   Depression screen [facility] 2/9 08/05/2020 02/13/2020 01/26/2020  Decreased Interest 0 0 0  Down, Depressed, Hopeless 0 0 0  PHQ - 2 Score 0 0 0    Social History   Tobacco Use  . Smoking status: Never Smoker  . Smokeless tobacco: Never Used  Vaping Use  . Vaping Use: Never used  Substance Use Topics  . Alcohol use: Yes    Alcohol/week: 5.0 standard drinks    Types: 5 Cans of beer per week    Comment: social  . Drug use: No    Review of Systems Per HPI unless specifically indicated above     Objective:    BP (!) 142/90   Pulse 67   Temp  97.7 F (36.5 C) (Temporal)   Resp 16   Ht 6\' 4"  (1.93 m)   Wt 270 lb (122.5 kg)   SpO2 99%   BMI 32.87 kg/m   Wt Readings from Last 3 Encounters:  08/05/20 270 lb (122.5 kg)  08/01/20 264 lb 9.6 oz (120 kg)  06/21/20 280 lb (127 kg)    Physical Exam Vitals and nursing note reviewed.  Constitutional:      General: He is not in acute distress.    Appearance: He is well-developed. He is not diaphoretic.     Comments: Well-appearing, comfortable, cooperative  HENT:     Head: Normocephalic and atraumatic.  Eyes:     General:        Right eye: No discharge.        Left eye: No discharge.     Conjunctiva/sclera: Conjunctivae normal.  Neck:     Thyroid: No thyromegaly.  Cardiovascular:     Rate and Rhythm: Normal rate and regular rhythm.     Heart sounds: Normal heart sounds. No murmur heard.   Pulmonary:     Effort: Pulmonary effort is normal. No respiratory distress.     Breath sounds: Normal breath sounds. No wheezing or rales.  Musculoskeletal:        General: Normal range of motion.     Cervical back: Normal  range of motion and neck supple.  Lymphadenopathy:     Cervical: No cervical adenopathy.  Skin:    General: Skin is warm and dry.     Findings: No erythema or rash.  Neurological:     Mental Status: He is alert and oriented to person, place, and time.  Psychiatric:        Behavior: Behavior normal.     Comments: Well groomed, good eye contact, normal speech and thoughts      Diabetic Foot Exam - Simple   Simple Foot Form Diabetic Foot exam was performed with the following findings: Yes 08/05/2020  4:04 PM  Visual Inspection See comments: Yes Sensation Testing See comments: Yes Pulse Check Posterior Tibialis and Dorsalis pulse intact bilaterally: Yes Comments Bilateral great toe with callus formation and heels with slight callus L>R. No ulceration or foot deformity. Reduced monofilament LEFT foot only great toe and heel otherwise intact sensation to  monofilament.      Results for orders placed or performed in visit on 08/01/20  POCT HgB A1C  Result Value Ref Range   Hemoglobin A1C 6.0 (A) 4.0 - 5.6 %      Assessment & Plan:   Problem List Items Addressed This Visit    Type 2 diabetes mellitus with hyperglycemia (HCC) - Primary    Dramatic improved K2H 6.0 Complications - peripheral neuropathy, hyperlipidemia, GERD, increases risk of future cardiovascular complications   Plan:  1. Not on therapy currently - on diet / lifestyle control 2. Encourage improved lifestyle - low carb, low sugar diet, reduce portion size, continue improving regular exercise 3. Check CBG , bring log to next visit for review 4. Continue ARB, - future Statin 5. DM Foot exam done today / Advised to schedule DM ophtho exam, send record - he can return to previous eye doctor Dr Mallie Mussel in [facility] but needs to tell them he is diabetic.  4 week BMET on Losartan ARB new start, and then 4 month follow-up DM A1c      Relevant Medications   losartan (COZAAR) 50 MG tablet   Obesity (BMI 30.0-34.9)   Hyperlipidemia associated with type 2 diabetes mellitus ([place])    Uncontrolled cholesterol  Plan: 1. Future consider Statin therapy Encourage improved lifestyle - low carb/cholesterol, reduce portion size, continue improving regular exercise      Relevant Medications   losartan (COZAAR) 50 MG tablet   Essential hypertension    Mildly elevated initial BP - Home BP readings limited but prior somewhat elevated  No known complications     Plan:  1. START Losartan 50mg  daily for HTN and renal protection in DM - counseling on risk of possible angioedema and precautions. 2. Encourage improved lifestyle - low sodium diet, restart regular exercise 3. Start monitor BP outside office, bring readings to next visit, if persistently >140/90 or new symptoms notify office sooner  Check BMET within 4 weeks for Creatinine, K, will notify results on mychart.      Relevant  Medications   losartan (COZAAR) 50 MG tablet   Other Relevant Orders   BASIC METABOLIC PANEL WITH GFR    Other Visit Diagnoses    Needs flu shot       Relevant Orders   Flu Vaccine QUAD 36+ mos IM (Completed)   Diabetic polyneuropathy associated with type 2 diabetes mellitus (HCC)       Relevant Medications   losartan (COZAAR) 50 MG tablet   gabapentin (NEURONTIN) 100 MG capsule  Meds ordered this encounter  Medications  . losartan (COZAAR) 50 MG tablet    Sig: Take 1 tablet (50 mg total) by mouth daily.    Dispense:  30 tablet    Refill:  2    Orders Placed This Encounter  Procedures  . Flu Vaccine QUAD 36+ mos IM  . BASIC METABOLIC PANEL WITH GFR    Standing Status:   Future    Standing Expiration Date:   02/03/2021     Follow up plan: Return in about 4 months (around 12/06/2020) for 4 weeks from now non fasting lab only chemistry check / then 4 month DM A1c.  Future labs ordered for BMET within 4 weeks, for new ARB to check K and Cr.  Nobie Putnam, [facility] 08/05/2020, 3:44 PM

## 2020-08-19 ENCOUNTER — Ambulatory Visit (INDEPENDENT_AMBULATORY_CARE_PROVIDER_SITE_OTHER): Payer: 59 | Admitting: Gastroenterology

## 2020-08-19 ENCOUNTER — Encounter: Payer: Self-pay | Admitting: Gastroenterology

## 2020-08-19 ENCOUNTER — Other Ambulatory Visit: Payer: Self-pay

## 2020-08-19 VITALS — BP 139/94 | HR 101 | Temp 97.9°F | Ht 76.0 in | Wt 272.4 lb

## 2020-08-19 DIAGNOSIS — Z800 Family history of malignant neoplasm of digestive organs: Secondary | ICD-10-CM | POA: Diagnosis not present

## 2020-08-19 DIAGNOSIS — Z86010 Personal history of colonic polyps: Secondary | ICD-10-CM

## 2020-08-19 NOTE — Progress Notes (Signed)
Tanner Bellows MD, MRCP(U.K) [address]  [place]  [place], [place] [address]  Main: [phone]  Fax: [phone]   Primary Care Physician: Tanner Hauser, DO  Primary Gastroenterologist:  Dr. Jonathon Frye   Chief Complaint  Patient presents with  . Follow-up    Hx of colon cancer    HPI: Tanner Frye is a 44 y.o. male   I have performed his colonoscopy on 06/21/2020 found 3 polyps in the sigmoid and descending colon ranging from 4 to 5 mm in size.  A 5 mm polyp was seen in the rectum which was sessile and also resected.  Pathology demonstrated that the polyps were tubular adenomas all, all 4 of them.  He says that he has a brother who has rectal cancer in 2 aunts who had some forms of cancer in someone else in the family had prostate cancer.  He wants to know if he would need to have a colonoscopy sooner than 3 years which he would be due.   Current Outpatient Medications  Medication Sig Dispense Refill  . Ascorbic Acid (VITAMIN C) 1000 MG tablet Take 1,000 mg by mouth daily.    . Blood Glucose Monitoring Suppl (FREESTYLE LITE) DEVI Use to check blood sugar up to twice a day as advised. 1 each 0  . EPIPEN 2-PAK 0.3 MG/0.3ML SOAJ injection INJ 0.3 ML IM ONCE FOR 1 DOSE  0  . FREESTYLE LITE test strip Use to check blood sugar up to twice a day as advised. 200 each 3  . gabapentin (NEURONTIN) 100 MG capsule Start 1 capsule daily, increase by 1 cap every 2-3 days as tolerated up to 3 times a day, or may take 3 at once in evening. 90 capsule 1  . Lancets (FREESTYLE) lancets Use to check blood sugar up to twice a day as advised. 100 each 12  . losartan (COZAAR) 50 MG tablet Take 1 tablet (50 mg total) by mouth daily. 30 tablet 2  . Multiple Vitamin (MULTIVITAMIN) tablet Take 1 tablet by mouth daily.    . naproxen (NAPROSYN) 500 MG tablet TAKE 1 TABLET BY MOUTH TWICE DAILY FOR 5 DAYS, THEN MAY USE TWICE DAILY AS NEEDED AFTER THAT 60 tablet 2  . omeprazole (PRILOSEC) 20  MG capsule Take 20 mg by mouth daily.     No current facility-administered medications for this visit.    Allergies as of 08/19/2020 - Review Complete 08/19/2020  Allergen Reaction Noted  . Bee venom  01/16/2014  . Oatmeal Swelling 02/13/2020  . Other Swelling 02/13/2020  . Prednisone Other (See Comments) 05/23/2013    ROS:  General: Negative for anorexia, weight loss, fever, chills, fatigue, weakness. ENT: Negative for hoarseness, difficulty swallowing , nasal congestion. CV: Negative for chest pain, angina, palpitations, dyspnea on exertion, peripheral edema.  Respiratory: Negative for dyspnea at rest, dyspnea on exertion, cough, sputum, wheezing.  GI: See history of present illness. GU:  Negative for dysuria, hematuria, urinary incontinence, urinary frequency, nocturnal urination.  Endo: Negative for unusual weight change.    Physical Examination:   BP (!) 139/94 (BP Location: Left Arm, Patient Position: Sitting, Cuff Size: Normal)   Pulse (!) 101   Temp 97.9 F (36.6 C) (Oral)   Ht 6\' 4"  (1.93 m)   Wt 272 lb 6.4 oz (123.6 kg)   BMI 33.16 kg/m   General: Well-nourished, well-developed in no acute distress.  Eyes: No icterus. Conjunctivae pink. Mouth: Oropharyngeal mucosa moist and pink , no  lesions erythema or exudate. Neuro: Alert and oriented x 3.  Grossly intact. Skin: Warm and dry, no jaundice.   Psych: Alert and cooperative, normal mood and affect.   Imaging Studies: No results found.  Assessment and Plan:   Tanner Frye is a 44 y.o. y/o male here to see me to discuss surveillance colonoscopy.  He has a personal history of tubular adenomas.  In 2018 he had one rectal tubular adenoma and in 2021 he had 3 subcentimeter tubular adenomas resected.  He does not meet criteria for genetic testing as he has had just for cumulative tubular adenomas.  Also his family history does not indicate features meeting criteria for evaluation of Lynch syndrome.  He is concerned  because his brother had rectal cancer at a young age.  Discussed with him that he is due for colonoscopy in 3 years.  He would like to know if there is any form of genetic testing that can be done to decide if he would need surveillance any sooner.  I suggested I could refer him to a genetic counselor to discuss options.    Dr Tanner Bellows  MD,MRCP [facility]) Follow up in as needed

## 2020-08-25 ENCOUNTER — Telehealth: Payer: 59 | Admitting: Family

## 2020-08-25 DIAGNOSIS — J069 Acute upper respiratory infection, unspecified: Secondary | ICD-10-CM

## 2020-08-25 MED ORDER — FLUTICASONE PROPIONATE 50 MCG/ACT NA SUSP: 2.0000 | Freq: Every day | NASAL | 6 refills | Status: DC

## 2020-08-25 MED ORDER — BENZONATATE 100 MG PO CAPS: 100.0000 mg | ORAL_CAPSULE | Freq: Three times a day (TID) | ORAL | 0 refills | Status: DC | PRN

## 2020-08-25 NOTE — Progress Notes (Signed)

## 2020-09-02 ENCOUNTER — Inpatient Hospital Stay: Payer: 59

## 2020-09-02 ENCOUNTER — Inpatient Hospital Stay: Payer: 59 | Attending: Oncology | Admitting: Licensed Clinical Social Worker

## 2020-09-02 ENCOUNTER — Other Ambulatory Visit: Payer: 59

## 2020-09-02 ENCOUNTER — Encounter: Payer: Self-pay | Admitting: Licensed Clinical Social Worker

## 2020-09-02 DIAGNOSIS — Z800 Family history of malignant neoplasm of digestive organs: Secondary | ICD-10-CM | POA: Diagnosis not present

## 2020-09-02 DIAGNOSIS — Z860100 Personal history of colon polyps, unspecified: Secondary | ICD-10-CM | POA: Insufficient documentation

## 2020-09-02 NOTE — Progress Notes (Signed)
REFERRING PROVIDER: Jonathon Bellows, MD [address] [place],  [place] [address]  PRIMARY PROVIDER:  Olin Hauser, DO  PRIMARY REASON FOR VISIT:  1. Family history of colorectal cancer   2. Personal history of colonic polyps      HISTORY OF PRESENT ILLNESS:   Tanner Frye, a 44 y.o. male, was seen for a [place] cancer genetics consultation at the request of Dr. Vicente Males due to a family history of colorectal cancer.  Tanner Frye presents to clinic today to discuss the possibility of a hereditary predisposition to cancer, genetic testing, and to further clarify his future cancer risks, as well as potential cancer risks for family members.   Tanner Frye is a 44 y.o. male with no personal history of cancer.  He had his first colonoscopy at age 45 which revealed 1 tubular adenoma, and he recently had another colonoscopy in 2021 that revealed 4 tubular adenomas. He reports he has not had prostate cancer screening.  CANCER HISTORY:  Oncology History   No history exists.    Past Medical History:  Diagnosis Date  . Anal fissure   . Family history of colorectal cancer   . GERD (gastroesophageal reflux disease)   . Personal history of colonic polyps     Past Surgical History:  Procedure Laterality Date  . COLONOSCOPY WITH PROPOFOL N/A 04/27/2017   Procedure: COLONOSCOPY WITH PROPOFOL;  Surgeon: Jonathon Bellows, MD;  Location: [facility] ENDOSCOPY;  Service: Endoscopy;  Laterality: N/A;  . COLONOSCOPY WITH PROPOFOL N/A 06/21/2020   Procedure: COLONOSCOPY WITH PROPOFOL;  Surgeon: Jonathon Bellows, MD;  Location[facility] ENDOSCOPY;  Service: Gastroenterology;  Laterality: N/A;  . HERNIA REPAIR  1980    Social History   Socioeconomic History  . Marital status: Married    Spouse name: Not on file  . Number of children: Not on file  . Years of education: Vocational Degree  . Highest education level: Associate degree: occupational, Hotel manager, or vocational program  Occupational History  . Not  on file  Tobacco Use  . Smoking status: Never Smoker  . Smokeless tobacco: Never Used  Vaping Use  . Vaping Use: Never used  Substance and Sexual Activity  . Alcohol use: Yes    Alcohol/week: 5.0 standard drinks    Types: 5 Cans of beer per week    Comment: social  . Drug use: No  . Sexual activity: Yes    Partners: Female  Other Topics Concern  . Not on file  Social History Narrative  . Not on file   Social Determinants of Health   Financial Resource Strain:   . Difficulty of Paying Living Expenses: Not on file  Food Insecurity:   . Worried About Charity fundraiser in the Last Year: Not on file  . Ran Out of Food in the Last Year: Not on file  Transportation Needs:   . Lack of Transportation (Medical): Not on file  . Lack of Transportation (Non-Medical): Not on file  Physical Activity:   . Days of Exercise per Week: Not on file  . Minutes of Exercise per Session: Not on file  Stress:   . Feeling of Stress : Not on file  Social Connections:   . Frequency of Communication with Friends and Family: Not on file  . Frequency of Social Gatherings with Friends and Family: Not on file  . Attends Religious Services: Not on file  . Active Member of Clubs or Organizations: Not on file  . Attends  Club or Organization Meetings: Not on file  . Marital Status: Not on file     FAMILY HISTORY:  We obtained a detailed, 4-generation family history.  Significant diagnoses are listed below: Family History  Problem Relation Age of Onset  . Hypertension Mother   . Hyperlipidemia Mother   . Diabetes Mother   . Rectal cancer Paternal Aunt   . Colon cancer Paternal Uncle   . Rectal cancer Brother 20  . Prostate cancer Neg Hx    Tanner Frye does not have biological children. He has 4 brothers. One brother was recently diagnosed with rectal cancer at 30, he has not had genetic testing. No cancers for his other brothers or nieces/nephews.  Tanner Frye mother is living at 32. He has limited  information about this side of the family, but does not report any known cancers.   Tanner Frye father is living at 91. Patient has 2 paternal uncles, 2 paternal aunts. An aunt had rectal cancer in her 71s, an uncle had colon cancer in his 40s. He does not have information about paternal cousins. Paternal grandfather had cancer, unknown type, and died at 54 due to heart issues. Paternal grandmother is living in her 57s.   Tanner Frye is unaware of previous family history of genetic testing for hereditary cancer risks. Patient's maternal ancestors are of unknown descent, and paternal ancestors are of English descent. There is no reported Ashkenazi Jewish ancestry. There is no known consanguinity.    GENETIC COUNSELING ASSESSMENT: Tanner Frye is a 44 y.o. male with a family history of colorectal cancer which is somewhat suggestive of a hereditary cancer syndrome and predisposition to cancer. We, therefore, discussed and recommended the following at today's visit.   DISCUSSION: We discussed that approximately 5-7% of colorectal cancer is hereditary  Most cases of hereditary colorectal cancer are associated with Lynch syndrome genes, although there are other genes associated with hereditary colorectal cancer as well.  We discussed that testing is beneficial for several reasons including knowing about cancer risks, identifying potential screening and risk-reduction options that may be appropriate, and to understand if other family members could be at risk for cancer and allow them to undergo genetic testing.   We reviewed the characteristics, features and inheritance patterns of hereditary cancer syndromes. We also discussed genetic testing, including the appropriate family members to test, the process of testing, insurance coverage and turn-around-time for results. We discussed the implications of a negative, positive and/or variant of uncertain significant result. We recommended Tanner Frye pursue genetic testing  for the [facility] Multi-Cancer+RNA gene panel.   The Multi-Cancer Panel offered by [facility] includes sequencing and/or deletion duplication testing of the following 84 genes: AIP, ALK, APC, ATM, AXIN2,BAP1,  BARD1, BLM, BMPR1A, BRCA1, BRCA2, BRIP1, CASR, CDC73, CDH1, CDK4, CDKN1B, CDKN1C, CDKN2A (p14ARF), CDKN2A (p16INK4a), CEBPA, CHEK2, CTNNA1, DICER1, DIS3L2, EGFR (c.2369C>T, p.Thr790Met variant only), EPCAM (Deletion/duplication testing only), FH, FLCN, GATA2, GPC3, GREM1 (Promoter region deletion/duplication testing only), HOXB13 (c.251G>A, p.Gly84Glu), HRAS, KIT, MAX, MEN1, MET, MITF (c.952G>A, p.Glu318Lys variant only), MLH1, MSH2, MSH3, MSH6, MUTYH, NBN, NF1, NF2, NTHL1, PALB2, PDGFRA, PHOX2B, PMS2, POLD1, POLE, POT1, PRKAR1A, PTCH1, PTEN, RAD50, RAD51C, RAD51D, RB1, RECQL4, RET,  RUNX1, SDHAF2, SDHA (sequence changes only), SDHB, SDHC, SDHD, SMAD4, SMARCA4, SMARCB1, SMARCE1, STK11, SUFU, TERC, TERT, TMEM127, TP53, TSC1, TSC2, VHL, WRN and WT1.   Based on Tanner Frye family history of cancer, he meets medical criteria for genetic testing. Despite that he meets criteria, he may still have an out of  pocket cost.   PLAN: After considering the risks, benefits, and limitations, Tanner Frye provided informed consent to pursue genetic testing and the blood sample was sent to [facility] for analysis of the Multi-Cancer Panel+RNA. Results should be available within approximately 2-3 weeks' time, at which point they will be disclosed by telephone to Tanner Frye, as will any additional recommendations warranted by these results. Tanner Frye will receive a summary of his genetic counseling visit and a copy of his results once available. This information will also be available in Epic.   Based on Tanner Frye family history, we recommended his brother have genetic counseling and testing when he feels ready. Tanner Frye will let us know if we can be of any assistance in coordinating genetic counseling and/or testing  for this family member.   Tanner Frye questions were answered to his satisfaction today. Our contact information was provided should additional questions or concerns arise. Thank you for the referral and allowing [place] to share in the care of your patient.   Faith Rogue, MS, [facility] Genetic Counselor [email or web address] Phone: [phone]  The patient was seen for a total of 25 minutes in face-to-face genetic counseling.  Dr. Grayland Ormond was available for discussion regarding this case.   _______________________________________________________________________ For Office Staff:  Number of people involved in session: 1 Was an Intern/ student involved with case: no

## 2020-09-06 ENCOUNTER — Other Ambulatory Visit: Payer: Self-pay | Admitting: *Deleted

## 2020-09-06 DIAGNOSIS — I10 Essential (primary) hypertension: Secondary | ICD-10-CM

## 2020-09-09 ENCOUNTER — Telehealth: Payer: Self-pay | Admitting: Family Medicine

## 2020-09-09 ENCOUNTER — Other Ambulatory Visit: Payer: 59

## 2020-09-09 ENCOUNTER — Other Ambulatory Visit: Payer: Self-pay

## 2020-09-09 DIAGNOSIS — I10 Essential (primary) hypertension: Secondary | ICD-10-CM

## 2020-09-09 MED ORDER — LOSARTAN POTASSIUM 100 MG PO TABS: 100.0000 mg | ORAL_TABLET | Freq: Every day | ORAL | 1 refills | Status: DC

## 2020-09-09 NOTE — Telephone Encounter (Signed)
Please notify patient that I have sent new rx Losartan for higher dose now 100mg  daily, one pill per day, #90 pills for 90 day.  Keep track of BP, if still not controlled, then follow up in future.  Nobie Putnam, DO [facility] 09/09/2020, 10:27 AM

## 2020-09-09 NOTE — Addendum Note (Signed)
Addended by: Olin Hauser on: 09/09/2020 10:27 AM   Modules accepted: Orders

## 2020-09-09 NOTE — Telephone Encounter (Signed)
Pt is out of losartan ( he also said that it was only lowing his BP by 5 points) he is requesting a refill medication and insurance is requesting a 90 day  supply

## 2020-09-09 NOTE — Telephone Encounter (Signed)
Pt is out of losartan also states that it's not working (lowering his bp it lowing by 5 points then by 2 its back up) he also states that insurance will only pay for a 90 day supply

## 2020-09-10 LAB — BASIC METABOLIC PANEL WITH GFR
BUN: 12 mg/dL (ref 7–25)
CO2: 26 mmol/L (ref 20–32)
Calcium: 9.5 mg/dL (ref 8.6–10.3)
Chloride: 102 mmol/L (ref 98–110)
Creat: 0.86 mg/dL (ref 0.60–1.35)
GFR, Est African American: 123 mL/min/{1.73_m2} (ref >=60)
GFR, Est Non African American: 106 mL/min/{1.73_m2} (ref >=60)
Glucose, Bld: 106 mg/dL — ABNORMAL HIGH (ref 65–99)
Potassium: 4.4 mmol/L (ref 3.5–5.3)
Sodium: 138 mmol/L (ref 135–146)

## 2020-09-19 ENCOUNTER — Other Ambulatory Visit: Payer: Self-pay | Admitting: Family Medicine

## 2020-09-19 ENCOUNTER — Telehealth: Payer: Self-pay | Admitting: Licensed Clinical Social Worker

## 2020-09-19 DIAGNOSIS — E1142 Type 2 diabetes mellitus with diabetic polyneuropathy: Secondary | ICD-10-CM

## 2020-09-19 DIAGNOSIS — E1165 Type 2 diabetes mellitus with hyperglycemia: Secondary | ICD-10-CM

## 2020-10-02 ENCOUNTER — Encounter: Payer: Self-pay | Admitting: Licensed Clinical Social Worker

## 2020-10-02 ENCOUNTER — Ambulatory Visit: Payer: Self-pay | Admitting: Licensed Clinical Social Worker

## 2020-10-02 DIAGNOSIS — Z800 Family history of malignant neoplasm of digestive organs: Secondary | ICD-10-CM

## 2020-10-02 DIAGNOSIS — Z86010 Personal history of colonic polyps: Secondary | ICD-10-CM

## 2020-10-02 DIAGNOSIS — Z1379 Encounter for other screening for genetic and chromosomal anomalies: Secondary | ICD-10-CM

## 2020-10-02 NOTE — Telephone Encounter (Signed)
Revealed negative genetic testing.  Revealed that a VUS in DICER1 was identified. This normal result is reassuring.  It is unlikely that there is an increased risk of  cancer due to a mutation in one of these genes.  However, genetic testing is not perfect, and cannot definitively rule out a hereditary cause.  It will be important for him to keep in contact with genetics to learn if any additional testing may be needed in the future.

## 2020-10-02 NOTE — Progress Notes (Signed)
HPI:  Tanner Frye was previously seen in the [place] clinic due to a family history of cancer and concerns regarding a hereditary predisposition to cancer. Please refer to our prior cancer genetics clinic note for more information regarding our discussion, assessment and recommendations, at the time. Tanner Frye recent genetic test results were disclosed to him, as were recommendations warranted by these results. These results and recommendations are discussed in more detail below.  CANCER HISTORY:  Oncology History   No history exists.    FAMILY HISTORY:  We obtained a detailed, 4-generation family history.  Significant diagnoses are listed below: Family History  Problem Relation Age of Onset   Hypertension Mother    Hyperlipidemia Mother    Diabetes Mother    Rectal cancer Paternal Aunt    Colon cancer Paternal Uncle    Rectal cancer Brother 70   Prostate cancer Neg Hx    Tanner Frye does not have biological children. He has 4 brothers. One brother was recently diagnosed with rectal cancer at 28, he has not had genetic testing. No cancers for his other brothers or nieces/nephews.  Tanner Frye mother is living at 27. He has limited information about this side of the family, but does not report any known cancers.   Tanner Frye father is living at 74. Patient has 2 paternal uncles, 2 paternal aunts. An aunt had rectal cancer in her 84s, an uncle had colon cancer in his 47s. He does not have information about paternal cousins. Paternal grandfather had cancer, unknown type, and died at 10 due to heart issues. Paternal grandmother is living in her 76s.   Tanner Frye is unaware of previous family history of genetic testing for hereditary cancer risks. Patient's maternal ancestors are of unknown descent, and paternal ancestors are of English descent. There is no reported Ashkenazi Jewish ancestry. There is no known consanguinity.     GENETIC TEST RESULTS: Genetic  testing reported out on 09/19/2020 through the [facility] Multi-Cancer Panel +RNA found no pathogenic mutations.   The Multi-Cancer Panel offered by [facility] includes sequencing and/or deletion duplication testing of the following 84 genes: AIP, ALK, APC, ATM, AXIN2,BAP1,  BARD1, BLM, BMPR1A, BRCA1, BRCA2, BRIP1, CASR, CDC73, CDH1, CDK4, CDKN1B, CDKN1C, CDKN2A (p14ARF), CDKN2A (p16INK4a), CEBPA, CHEK2, CTNNA1, DICER1, DIS3L2, EGFR (c.2369C>T, p.Thr790Met variant only), EPCAM (Deletion/duplication testing only), FH, FLCN, GATA2, GPC3, GREM1 (Promoter region deletion/duplication testing only), HOXB13 (c.251G>A, p.Gly84Glu), HRAS, KIT, MAX, MEN1, MET, MITF (c.952G>A, p.Glu318Lys variant only), MLH1, MSH2, MSH3, MSH6, MUTYH, NBN, NF1, NF2, NTHL1, PALB2, PDGFRA, PHOX2B, PMS2, POLD1, POLE, POT1, PRKAR1A, PTCH1, PTEN, RAD50, RAD51C, RAD51D, RB1, RECQL4, RET, RNF43, RUNX1, SDHAF2, SDHA (sequence changes only), SDHB, SDHC, SDHD, SMAD4, SMARCA4, SMARCB1, SMARCE1, STK11, SUFU, TERC, TERT, TMEM127, TP53, TSC1, TSC2, VHL, WRN and WT1.   RNA genes analyzed AIP, APC*, ATM*, AXIN2, BAP1, BARD1, BLM, BMPR1A, BRCA1, BRCA2, BRIP1, CDC73, CDH1, CDKN1B, CDKN1C, CHEK2, CTNNA1, DICER1*, DIS3L2, FH*, FLCN, GATA2, MAX*, MEN1*, MLH1*, MSH2*, MSH3*, MSH6*, MUTYH, NBN, NF1*, NF2, NTHL1, PALB2, PMS2*, POLD1*, POLE, POT1, PRKAR1A, PTCH1, PTEN*, RAD50, RAD51C, RAD51D, RB1*, RUNX1, SDHA*, SDHAF2, SDHB, SDHC*, SDHD, SMAD4, SMARCA4, SMARCB1, SMARCE1, STK11, SUFU, TMEM127, TP53, TSC1*, TSC2, VHL, WRN*  The test report has been scanned into EPIC and is located under the Molecular Pathology section of the Results Review tab.  A portion of the result report is included below for reference.     We discussed with Tanner Frye that because current genetic testing is not perfect, it is possible there  may be a gene mutation in one of these genes that current testing cannot detect, but that chance is small.  We also discussed, that there could be another  gene that has not yet been discovered, or that we have not yet tested, that is responsible for the cancer diagnoses in the family. It is also possible there is a hereditary cause for the cancer in the family that Tanner Frye did not inherit and therefore was not identified in his testing.  Therefore, it is important to remain in touch with cancer genetics in the future so that we can continue to offer Tanner Frye the most up to date genetic testing.   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  At this time, it is unknown if this variant is associated with increased cancer risk or if this is a normal finding, but most variants such as this get reclassified to being inconsequential. It should not be used to make medical management decisions. With time, we suspect the lab will determine the significance of this variant, if any. If we do learn more about it, we will try to contact Tanner Frye to discuss it further. However, it is important to stay in touch with [place] periodically and keep the address and phone number up to date.  ADDITIONAL GENETIC TESTING: We discussed with Tanner Frye that his genetic testing was fairly extensive.  If there are genes identified to increase cancer risk that can be analyzed in the future, we would be happy to discuss and coordinate this testing at that time.    CANCER SCREENING RECOMMENDATIONS: Tanner Frye test result is considered negative (normal).  This means that we have not identified a hereditary cause for his  family history of cancer at this time.   While reassuring, this does not definitively rule out a hereditary predisposition to cancer. It is still possible that there could be genetic mutations that are undetectable by current technology. There could be genetic mutations in genes that have not been tested or identified to increase cancer risk.  Therefore, it is recommended he continue to follow the cancer  management and screening guidelines provided by his  primary healthcare provider.   An individual's cancer risk and medical management are not determined by genetic test results alone. Overall cancer risk assessment incorporates additional factors, including personal medical history, family history, and any available genetic information that may result in a personalized plan for cancer prevention and surveillance.  RECOMMENDATIONS FOR FAMILY MEMBERS:  Relatives in this family might be at some increased risk of developing cancer, over the general population risk, simply due to the family history of cancer.  We recommended male relatives in this family have a yearly mammogram beginning at age 10, or 60 years younger than the earliest onset of cancer, an annual clinical breast exam, and perform monthly breast self-exams. Male relatives in this family should also have a gynecological exam as recommended by their primary provider.  All family members should be referred for colonoscopy starting at age 65.    It is also possible there is a hereditary cause for the cancer in Tanner Frye family that he did not inherit and therefore was not identified in him.  Based on Tanner Frye family history, we recommended his brother/paternal relatives have genetic counseling and testing. Tanner Frye will let us know if we can be of any assistance in coordinating genetic counseling and/or testing for these family members.  FOLLOW-UP: Lastly, we discussed with Tanner Frye that cancer genetics is a rapidly advancing field and it is possible that new genetic tests will be appropriate for him and/or his family members in the future. We encouraged him to remain in contact with cancer genetics on an annual basis so we can update his personal and family histories and let him know of advances in cancer genetics that may benefit this family.   Our contact number was provided. Mr. Mandt questions were answered to his satisfaction, and  he knows he is welcome to call us at anytime with additional questions or concerns.   Faith Rogue, MS, [facility] Genetic Counselor [email or web address] Phone: [phone]

## 2020-10-14 ENCOUNTER — Other Ambulatory Visit: Payer: Self-pay | Admitting: Family Medicine

## 2020-10-14 DIAGNOSIS — I10 Essential (primary) hypertension: Secondary | ICD-10-CM

## 2020-10-14 MED ORDER — LOSARTAN POTASSIUM 100 MG PO TABS: 100.0000 mg | ORAL_TABLET | Freq: Every day | ORAL | 1 refills | Status: DC

## 2020-10-28 DIAGNOSIS — L570 Actinic keratosis: Secondary | ICD-10-CM | POA: Diagnosis not present

## 2020-10-28 DIAGNOSIS — L821 Other seborrheic keratosis: Secondary | ICD-10-CM | POA: Diagnosis not present

## 2020-10-28 DIAGNOSIS — D235 Other benign neoplasm of skin of trunk: Secondary | ICD-10-CM | POA: Diagnosis not present

## 2020-10-28 DIAGNOSIS — L578 Other skin changes due to chronic exposure to nonionizing radiation: Secondary | ICD-10-CM | POA: Diagnosis not present

## 2020-10-30 ENCOUNTER — Telehealth: Payer: Self-pay | Admitting: Cardiovascular Disease

## 2020-10-30 NOTE — Telephone Encounter (Signed)
  Patient Consent for Virtual Visit         Tanner Frye has provided verbal consent on 10/30/2020 for a virtual visit (video or telephone).   CONSENT FOR VIRTUAL VISIT FOR:  Tanner Frye  By participating in this virtual visit I agree to the following:  I hereby voluntarily request, consent and authorize [place] and its employed or contracted physicians, physician assistants, nurse practitioners or other licensed health care professionals (the Practitioner), to provide me with telemedicine health care services (the "Services") as deemed necessary by the treating Practitioner. I acknowledge and consent to receive the Services by the Practitioner via telemedicine. I understand that the telemedicine visit will involve communicating with the Practitioner through live audiovisual communication technology and the disclosure of certain medical information by electronic transmission. I acknowledge that I have been given the opportunity to request an in-person assessment or other available alternative prior to the telemedicine visit and am voluntarily participating in the telemedicine visit.  I understand that I have the right to withhold or withdraw my consent to the use of telemedicine in the course of my care at any time, without affecting my right to future care or treatment, and that the Practitioner or I may terminate the telemedicine visit at any time. I understand that I have the right to inspect all information obtained and/or recorded in the course of the telemedicine visit and may receive copies of available information for a reasonable fee.  I understand that some of the potential risks of receiving the Services via telemedicine include:  Marland Kitchen Delay or interruption in medical evaluation due to technological equipment failure or disruption; . Information transmitted may not be sufficient (e.g. poor resolution of images) to allow for appropriate medical decision making by the Practitioner;  and/or  . In rare instances, security protocols could fail, causing a breach of personal health information.  Furthermore, I acknowledge that it is my responsibility to provide information about my medical history, conditions and care that is complete and accurate to the best of my ability. I acknowledge that Practitioner's advice, recommendations, and/or decision may be based on factors not within their control, such as incomplete or inaccurate data provided by me or distortions of diagnostic images or specimens that may result from electronic transmissions. I understand that the practice of medicine is not an exact science and that Practitioner makes no warranties or guarantees regarding treatment outcomes. I acknowledge that a copy of this consent can be made available to me via my patient portal ([place]), or I can request a printed copy by calling the office of [place].    I understand that my insurance will be billed for this visit.   I have read or had this consent read to me. . I understand the contents of this consent, which adequately explains the benefits and risks of the Services being provided via telemedicine.  . I have been provided ample opportunity to ask questions regarding this consent and the Services and have had my questions answered to my satisfaction. . I give my informed consent for the services to be provided through the use of telemedicine in my medical care

## 2020-10-31 ENCOUNTER — Encounter: Payer: Self-pay | Admitting: Cardiovascular Disease

## 2020-10-31 ENCOUNTER — Other Ambulatory Visit: Payer: Self-pay

## 2020-10-31 ENCOUNTER — Telehealth (INDEPENDENT_AMBULATORY_CARE_PROVIDER_SITE_OTHER): Payer: 59 | Admitting: Cardiovascular Disease

## 2020-10-31 ENCOUNTER — Other Ambulatory Visit: Payer: Self-pay | Admitting: Cardiovascular Disease

## 2020-10-31 VITALS — BP 145/98 | Ht 76.0 in | Wt 272.0 lb

## 2020-10-31 DIAGNOSIS — I10 Essential (primary) hypertension: Secondary | ICD-10-CM | POA: Diagnosis not present

## 2020-10-31 DIAGNOSIS — E1165 Type 2 diabetes mellitus with hyperglycemia: Secondary | ICD-10-CM

## 2020-10-31 MED ORDER — LOSARTAN POTASSIUM-HCTZ 100-25 MG PO TABS
1.0000 | ORAL_TABLET | Freq: Every day | ORAL | 2 refills | Status: DC
Start: 2020-10-31 — End: 2020-11-20

## 2020-10-31 MED ORDER — LOSARTAN POTASSIUM-HCTZ 100-25 MG PO TABS: 1.0000 | ORAL_TABLET | Freq: Every day | ORAL | 3 refills | Status: DC

## 2020-10-31 NOTE — Progress Notes (Signed)
Virtual Visit via Telephone Note   This visit type was conducted due to national recommendations for restrictions regarding the COVID-19 Pandemic (e.g. social distancing) in an effort to limit this patient's exposure and mitigate transmission in our community.  Due to his co-morbid illnesses, this patient is at least at moderate risk for complications without adequate follow up.  This format is felt to be most appropriate for this patient at this time.  The patient did not have access to video technology/had technical difficulties with video requiring transitioning to audio format only (telephone).  All issues noted in this document were discussed and addressed.  No physical exam could be performed with this format.  Please refer to the patient's chart for his  consent to telehealth for [facility]    Date:  10/31/2020   ID:  Tanner Frye, DOB Aug 08, 1976, MRN [id number] The patient was identified using 2 identifiers.  Patient Location: Home Provider Location: Home Office  PCP:  Tanner Hauser, DO  Cardiologist:  No primary care provider on file.  Electrophysiologist:  None   Evaluation Performed:  New Patient Evaluation  Chief Complaint: Elevated blood pressure.  History of Present Illness:    Tanner Frye is a 45 y.o. male who was reached via phone for evaluation of elevated blood pressure.  He has known history of essential hypertension, type 2 diabetes, GERD and obesity.  He has no previous cardiac history on no chronic medical conditions. He has family history of hypertension and hyperlipidemia but no premature coronary artery disease. He had coronary calcium score in 2019 which was normal at 0.  He was evaluated by me in 2015 for elevated blood pressure but did not require medication at that time. He has been doing well with no recent chest pain, shortness of breath or palpitations.  He was diagnosed with type 2 diabetes over the last year with hemoglobin A1c close  to 9.  He was able to get it down to 6 on most recent testing.  Nonetheless, he has been dealing with elevated blood pressure and was started on losartan.  The dose was subsequently increased to 100 mg daily and in spite of that his blood pressure is still not controlled. He has been trying to lose weight.  He has no symptoms of sleep apnea.  He uses naproxen as needed for pain but not more than 2 or 3 tablets a month.   The patient does not have symptoms concerning for COVID-19 infection (fever, chills, cough, or new shortness of breath).    Past Medical History:  Diagnosis Date  . Anal fissure   . Family history of colorectal cancer   . GERD (gastroesophageal reflux disease)   . Personal history of colonic polyps    Past Surgical History:  Procedure Laterality Date  . COLONOSCOPY WITH PROPOFOL N/A 04/27/2017   Procedure: COLONOSCOPY WITH PROPOFOL;  Surgeon: Tanner Bellows, MD;  Location: [facility] ENDOSCOPY;  Service: Endoscopy;  Laterality: N/A;  . COLONOSCOPY WITH PROPOFOL N/A 06/21/2020   Procedure: COLONOSCOPY WITH PROPOFOL;  Surgeon: Tanner Bellows, MD;  Location: [facility] ENDOSCOPY;  Service: Gastroenterology;  Laterality: N/A;  . HERNIA REPAIR  1980     Current Meds  Medication Sig  . Ascorbic Acid (VITAMIN C) 1000 MG tablet Take 1,000 mg by mouth daily.  . Blood Glucose Monitoring Suppl (FREESTYLE LITE) DEVI Use to check blood sugar up to twice a day as advised.  Marland Kitchen EPIPEN 2-PAK 0.3 MG/0.3ML SOAJ injection INJ 0.3 ML  IM ONCE FOR 1 DOSE  . FREESTYLE LITE test strip Use to check blood sugar up to twice a day as advised.  . gabapentin (NEURONTIN) 100 MG capsule Start 1 capsule daily, increase by 1 cap every 2-3 days as tolerated up to 3 times a day, or may take 3 at once in evening.  . Lancets (FREESTYLE) lancets Use to check blood sugar up to twice a day as advised.  . Loratadine 10 MG CAPS   . losartan (COZAAR) 100 MG tablet Take 1 tablet (100 mg total) by mouth daily.  . Multiple Vitamin  (MULTIVITAMIN) tablet Take 1 tablet by mouth daily.  . naproxen (NAPROSYN) 500 MG tablet TAKE 1 TABLET BY MOUTH TWICE DAILY FOR 5 DAYS, THEN MAY USE TWICE DAILY AS NEEDED AFTER THAT  . omeprazole (PRILOSEC) 20 MG capsule Take 20 mg by mouth daily.     Allergies:   Bee venom, Oatmeal, Other, and Prednisone   Social History   Tobacco Use  . Smoking status: Never Smoker  . Smokeless tobacco: Never Used  Vaping Use  . Vaping Use: Never used  Substance Use Topics  . Alcohol use: Yes    Alcohol/week: 5.0 standard drinks    Types: 5 Cans of beer per week    Comment: social  . Drug use: No     Family Hx: The patient's family history includes Colon cancer in his paternal uncle; Diabetes in his mother; Hyperlipidemia in his mother; Hypertension in his mother; Rectal cancer in his paternal aunt; Rectal cancer (age of onset: 66) in his brother. There is no history of Prostate cancer.  ROS:   Please see the history of present illness.     All other systems reviewed and are negative.   Prior CV studies:   The following studies were reviewed today:    Labs/Other Tests and Data Reviewed:    EKG:  No ECG reviewed.  Recent Labs: 01/26/2020: ALT 145 09/09/2020: BUN 12; Creat 0.86; Potassium 4.4; Sodium 138   Recent Lipid Panel Lab Results  Component Value Date/Time   CHOL 223 (H) 03/29/2019 09:37 AM   CHOL 244 (H) 06/19/2014 02:33 PM   TRIG 189 (H) 03/29/2019 09:37 AM   HDL 49 03/29/2019 09:37 AM   HDL 42 06/19/2014 02:33 PM   CHOLHDL 4.6 03/29/2019 09:37 AM   LDLCALC 141 (H) 03/29/2019 09:37 AM    Wt Readings from Last 3 Encounters:  10/31/20 272 lb (123.4 kg)  08/19/20 272 lb 6.4 oz (123.6 kg)  08/05/20 270 lb (122.5 kg)     Risk Assessment/Calculations:      Objective:    Vital Signs:  BP (!) 145/98   Ht 6\' 4"  (1.93 m)   Wt 272 lb (123.4 kg)   BMI 33.11 kg/m    VITAL SIGNS:  reviewed  ASSESSMENT & PLAN:    1. Essential hypertension: I suspect that he has  essential hypertension which is currently not controlled.  I discussed with him the importance of low-sodium diet, regular exercise and weight loss.  He is already trying to do that.  I elected to change losartan to losartan-hydrochlorothiazide 100/25 mg once daily.  Check basic metabolic profile in 1 week.  There is nothing to suggest secondary hypertension. 2. Type 2 diabetes: Improved with most recent hemoglobin A1c of 6. 3. Obesity: I discussed with him the importance of continued healthy lifestyle changes and weight loss.         Time:   Today, I have  spent 8 minutes with the patient with telehealth technology discussing the above problems.     Medication Adjustments/Labs and Tests Ordered: Current medicines are reviewed at length with the patient today.  Concerns regarding medicines are outlined above.   Tests Ordered: No orders of the defined types were placed in this encounter.   Medication Changes: No orders of the defined types were placed in this encounter.   Follow Up:  In Person in 6 month(s)  Signed, Kathlyn Sacramento, MD  10/31/2020 8:46 AM    [place]

## 2020-10-31 NOTE — Patient Instructions (Signed)
Medication Instructions:  Your physician has recommended you make the following change in your medication:   STOP Losartan  START Losartan HCTZ 100/25 mg daily. An Rx has b  *If you need a refill on your cardiac medications before your next appointment, please call your pharmacy*   Lab Work: Your physician recommends that you return for lab work (bmet) in: 1 week  Please have your lab drawn at the [facility]. You do not need an appt. Lab hours are Mon-Fri 7am-6pm.  If you have labs (blood work) drawn today and your tests are completely normal, you will receive your results only by: Marland Kitchen MyChart Message (if you have MyChart) OR . A paper copy in the mail If you have any lab test that is abnormal or we need to change your treatment, we will call you to review the results.   Testing/Procedures: None ordered   Follow-Up: At [facility], you and your health needs are our priority.  As part of our continuing mission to provide you with exceptional heart care, we have created designated Provider Care Teams.  These Care Teams include your primary Cardiologist (physician) and Advanced Practice Providers (APPs -  Physician Assistants and Nurse Practitioners) who all work together to provide you with the care you need, when you need it.  We recommend signing up for the patient portal called "MyChart".  Sign up information is provided on this After Visit Summary.  MyChart is used to connect with patients for Virtual Visits (Telemedicine).  Patients are able to view lab/test results, encounter notes, upcoming appointments, etc.  Non-urgent messages can be sent to your provider as well.   To learn more about what you can do with MyChart, go to [email or web address].    Your next appointment:   6 month(s)  The format for your next appointment:   In Person  Provider:   You may see Kathlyn Sacramento, MD or one of the following Advanced Practice Providers on your designated Care Team:     Murray Hodgkins, NP  Christell Faith, PA-C  Marrianne Mood, PA-C  Cadence [place], [place]  Laurann [place], NP    Other Instructions N/A

## 2020-11-19 NOTE — Progress Notes (Addendum)
Patient ID: Tanner Frye, male   DOB: March 26, 1976, 45 y.o.   MRN: [id number]   This visit occurred during the SARS-CoV-2 public health emergency.  Safety protocols were in place, including screening questions prior to the visit, additional usage of staff PPE, and extensive cleaning of exam room while observing appropriate contact time as indicated for disinfecting solutions.   HPI: [name] is a 45 y.o.-year-old male, referred by his PCP, Malfi, Lupita Raider, FNP, for management of DM2, dx in 2021, Diet controlled, uncontrolled, with complications (peripheral neuropathy).  He mentions that he is mostly here because of his neuropathy.  Reviewed HbA1c: Lab Results  Component Value Date   HGBA1C 6.0 (A) 08/01/2020   HGBA1C 8.9 (H) 01/26/2020   HGBA1C 6.8 (H) 03/29/2019   HGBA1C 6.2 (H) 03/25/2018   Pt is not on any medication for diabetes  Pt checks his sugars 3-4x a week: - am: 100-120 - 2h after b'fast: n/c - before lunch: n/c - 2h after lunch: n/c - before dinner: 90-100 - 2h after dinner: n/c - bedtime: n/c - nighttime: n/c Lowest sugar was 80; it is unclear if he has hypoglycemia awareness.  Highest sugar was 180.  Glucometer: Freestyle lite  Pt's meals are: - Breakfast: 2 sugar-free jello cups + 2 eggs + 2 pieces of bacon - Lunch: prev. Fast food >> now home cooked: chicken + veggies - Dinner: same - Snacks: no desserts, apples, carrots, crackers. No sweet drinks, but water, unsweet tea. Used to drink OJ. Saw nutrition at [facility] in the past that he felt that this is helpful.  - no CKD, last BUN/creatinine:  Lab Results  Component Value Date   BUN 12 09/09/2020   BUN 11 01/26/2020   CREATININE 0.86 09/09/2020   CREATININE 0.84 01/26/2020  On losartan 100.  -+ HL; last set of lipids: Lab Results  Component Value Date   CHOL 223 (H) 03/29/2019   HDL 49 03/29/2019   LDLCALC 141 (H) 03/29/2019   TRIG 189 (H) 03/29/2019   CHOLHDL 4.6 03/29/2019  He is not on a  statin. Of note, he had a coronary calcium score of 0.  - last eye exam was in 2021. No DR reportedly.   - + numbness and tingling in his feet. No pain. Started mid 2021.  Pt has FH of prediabetes in M.  He also has a history of transaminitis (increased ALT) and also HTN.  Started vitamin C and also takes a MVI.   ROS: Constitutional: no weight gain, no weight loss, no fatigue, no subjective hyperthermia, no subjective hypothermia, no nocturia Eyes: no blurry vision, no xerophthalmia ENT: no sore throat, no nodules palpated in neck, no dysphagia, no odynophagia, no hoarseness, no tinnitus,+ hypoacusis Cardiovascular: no CP, no SOB, no palpitations, no leg swelling Respiratory: no cough, no SOB, no wheezing Gastrointestinal: no N, no V, no D, no C, no acid reflux Musculoskeletal: no muscle, no joint aches Skin: no rash, no hair loss Neurological: no tremors, + numbness and tingling/no dizziness/no HAs Psychiatric: no depression, no anxiety  Past Medical History:  Diagnosis Date  . Anal fissure   . Family history of colorectal cancer   . GERD (gastroesophageal reflux disease)   . Personal history of colonic polyps    Past Surgical History:  Procedure Laterality Date  . COLONOSCOPY WITH PROPOFOL N/A 04/27/2017   Procedure: COLONOSCOPY WITH PROPOFOL;  Surgeon: Jonathon Bellows, MD;  Location: [facility] ENDOSCOPY;  Service: Endoscopy;  Laterality: N/A;  .  COLONOSCOPY WITH PROPOFOL N/A 06/21/2020   Procedure: COLONOSCOPY WITH PROPOFOL;  Surgeon: Jonathon Bellows, MD;  Location[facility] ENDOSCOPY;  Service: Gastroenterology;  Laterality: N/A;  . HERNIA REPAIR  1980   Social History   Socioeconomic History  . Marital status: Married    Spouse name: Not on file  . Number of children: Not on file  . Years of education: Vocational Degree  . Highest education level: Associate degree: occupational, Hotel manager, or vocational program  Occupational History  . Occupation: Armed forces operational officer  Tobacco Use  .  Smoking status: Never Smoker  . Smokeless tobacco: Never Used  Vaping Use  . Vaping Use: Never used  Substance and Sexual Activity  . Alcohol use: Yes    Alcohol/week: 0.0 standard drinks    Comment: Wine or beer 1 -2  once or twice a week.  . Drug use: No  . Sexual activity: Yes    Partners: Female    Birth control/protection: Surgical  Other Topics Concern  . Not on file  Social History Narrative  . Not on file   Social Determinants of Health   Financial Resource Strain: Not on file  Food Insecurity: Not on file  Transportation Needs: Not on file  Physical Activity: Not on file  Stress: Not on file  Social Connections: Not on file  Intimate Partner Violence: Not on file   Current Outpatient Medications on File Prior to Visit  Medication Sig Dispense Refill  . Ascorbic Acid (VITAMIN C) 1000 MG tablet Take 1,000 mg by mouth daily.    . Blood Glucose Monitoring Suppl (FREESTYLE LITE) DEVI Use to check blood sugar up to twice a day as advised. 1 each 0  . EPIPEN 2-PAK 0.3 MG/0.3ML SOAJ injection INJ 0.3 ML IM ONCE FOR 1 DOSE  0  . FREESTYLE LITE test strip Use to check blood sugar up to twice a day as advised. 200 each 3  . gabapentin (NEURONTIN) 100 MG capsule Start 1 capsule daily, increase by 1 cap every 2-3 days as tolerated up to 3 times a day, or may take 3 at once in evening. 90 capsule 1  . Lancets (FREESTYLE) lancets Use to check blood sugar up to twice a day as advised. 100 each 12  . Loratadine 10 MG CAPS     . losartan-hydrochlorothiazide (HYZAAR) 100-25 MG tablet Take 1 tablet by mouth daily. 90 tablet 2  . Multiple Vitamin (MULTIVITAMIN) tablet Take 1 tablet by mouth daily.    . naproxen (NAPROSYN) 500 MG tablet TAKE 1 TABLET BY MOUTH TWICE DAILY FOR 5 DAYS, THEN MAY USE TWICE DAILY AS NEEDED AFTER THAT 60 tablet 2  . omeprazole (PRILOSEC) 20 MG capsule Take 20 mg by mouth daily.     No current facility-administered medications on file prior to visit.    Allergies  Allergen Reactions  . Bee Venom   . Oatmeal Swelling  . Other Swelling    Processed foods that are baked  . Prednisone Other (See Comments)    Other reaction(s): Other (See Comments) Effects mood verbal per patient Other reaction(s): Other (See Comments) Effects mood verbal per patient Effects mood verbal per patient Other reaction(s): Other (See Comments) Effects mood verbal per patient Effects mood verbal per patient   Family History  Problem Relation Age of Onset  . Hypertension Mother   . Hyperlipidemia Mother   . Diabetes Mother   . Rectal cancer Paternal Aunt   . Colon cancer Paternal Uncle   . Rectal cancer  Brother 68  . Prostate cancer Neg Hx    PE: BP 120/88   Pulse 88   Ht 6\' 4"  (1.93 m)   Wt 271 lb 6.4 oz (123.1 kg)   SpO2 95%   BMI 33.04 kg/m  Wt Readings from Last 3 Encounters:  11/20/20 271 lb 6.4 oz (123.1 kg)  10/31/20 272 lb (123.4 kg)  08/19/20 272 lb 6.4 oz (123.6 kg)   Constitutional: overweight, in NAD Eyes: PERRLA, EOMI, no exophthalmos ENT: moist mucous membranes, no thyromegaly, no cervical lymphadenopathy Cardiovascular: RRR, No MRG Respiratory: CTA B Gastrointestinal: abdomen soft, NT, ND, BS+ Musculoskeletal: no deformities, strength intact in all 4 Skin: moist, warm, no rashes Neurological: no tremor with outstretched hands, DTR normal in all 4 Diabetic Foot Exam - Simple   Simple Foot Form Diabetic Foot exam was performed with the following findings: Yes 11/20/2020  8:52 AM  Visual Inspection No deformities, no ulcerations, no other skin breakdown bilaterally: Yes Sensation Testing Intact to touch and monofilament testing bilaterally: Yes Pulse Check Posterior Tibialis and Dorsalis pulse intact bilaterally: Yes Comments Skin warm, no lesions.  Adequate sensation to monofilament bilaterally.    ASSESSMENT: 1. DM2, non-insulin-dependent, Diet controlled, uncontrolled, with complications - PN  2. Peripheral  neuropathy  PLAN:  1. Patient with recent diagnosis of diabetes, last year, after a period of time with prediabetes.  After an HbA1c returned 8.9% in 01/2020, he changed his diet and sugars improved significantly.  Latest HbA1c available for review was 6.0%.  At today's visit, we rechecked the HbA1c and this was 6.2%, only slightly worse, but still at goal. -We reviewed together his diet at today's visit and I made specific suggestions about improving this further.  He is eating a fatty breakfast and also has more fat with lunch and dinner and we discussed about the concept of insulin resistance.  I recommended to start reducing fatty foods.  Discussed about healthy snacks, also.  However, based on his blood sugars at home, he does not need any antidiabetic medications for now. - I suggested to:  Patient Instructions   Please let me know if the sugars are consistently <70 or >200.  Please stop at the lab.  If the results are normal, I would suggest: - Alpha Lipoic Acid 600 mg 2x a day  Please return in 3-4 months with your sugar log.   - Strongly advised him to start checking sugars at different times of the day - check 1x a day, rotating checks-given CBG log - discussed about CBG targets for treatment: 80-130 mg/dL before meals and <180 mg/dL after meals; target HbA1c <7%. - given sugar log and advised how to fill it and to bring it at next appt  - given foot care handout and explained the principles  - given instructions for hypoglycemia management "15-15 rule"  - advised for yearly eye exams  - he is due - Return to clinic in 3 mo with sugar log   2. Peripheral neuropathy -Developed around the time when his diabetes became uncontrolled last year. -However, this has not improved recently and he mentions almost no sensation in the front part of his sole and increase sensitivity midfoot -He is taking a multivitamin for men -Also, upon review of the chart, he has been on gabapentin,  without improvement -We discussed about possible causes for peripheral neuropathy, in his case, most likely related to diabetes, however, I would have expected this to improve with improvement of his blood sugars. -At  this visit, therefore, will check the following labs: Orders Placed This Encounter  Procedures  . TSH  . Vitamin B12  . Vitamin B6  . Ferritin  . Basic metabolic panel  -If the labs above are normal, I would suggest alpha-lipoic acid 600 mg twice a day -If this is not helping, we can switch to Lyrica, or possibly refer to neurology  Office Visit on 11/20/2020  Component Date Value Ref Range Status  . TSH 11/20/2020 1.98  0.35 - 4.50 uIU/mL Final  . Vitamin B-12 11/20/2020 537  211 - 911 pg/mL Final  . Vitamin B6 11/20/2020 42.0* 2.1 - 21.7 ng/mL Final   Comment: Marland Kitchen Vitamin supplementation within 24 hours prior to blood draw may affect the accuracy of the results. . This test was developed and its analytical performance characteristics have been determined by [place], [place]. It has not been cleared or approved by the U.S. Food and Drug Administration. This assay has been validated pursuant to the CLIA regulations and is used for clinical purposes. .   . Ferritin 11/20/2020 49.9  22.0 - 322.0 ng/mL Final  . Sodium 11/20/2020 138  135 - 145 mEq/L Final  . Potassium 11/20/2020 4.0  3.5 - 5.1 mEq/L Final  . Chloride 11/20/2020 99  96 - 112 mEq/L Final  . CO2 11/20/2020 35* 19 - 32 mEq/L Final  . Glucose, Bld 11/20/2020 106* 70 - 99 mg/dL Final  . BUN 11/20/2020 12  6 - 23 mg/dL Final  . Creatinine, Ser 11/20/2020 0.92  0.40 - 1.50 mg/dL Final  . GFR 11/20/2020 101.44  >60.00 mL/min Final   Calculated using the CKD-EPI Creatinine Equation (2021)  . Calcium 11/20/2020 10.3  8.4 - 10.5 mg/dL Final  . Hemoglobin A1C 11/20/2020 6.2* 4.0 - 5.6 % Final   His vitamin B6 is high and I advised him to stop multivitamins and return for a  recheck.  Philemon Kingdom, MD PhD [facility] Endocrinology

## 2020-11-20 ENCOUNTER — Ambulatory Visit: Payer: 59 | Admitting: Internal Medicine

## 2020-11-20 ENCOUNTER — Other Ambulatory Visit: Payer: Self-pay

## 2020-11-20 ENCOUNTER — Encounter: Payer: Self-pay | Admitting: Internal Medicine

## 2020-11-20 VITALS — BP 120/88 | HR 88 | Ht 76.0 in | Wt 271.4 lb

## 2020-11-20 DIAGNOSIS — G6289 Other specified polyneuropathies: Secondary | ICD-10-CM

## 2020-11-20 DIAGNOSIS — E1142 Type 2 diabetes mellitus with diabetic polyneuropathy: Secondary | ICD-10-CM

## 2020-11-20 LAB — BASIC METABOLIC PANEL
BUN: 12 mg/dL (ref 6–23)
CO2: 35 mEq/L — ABNORMAL HIGH (ref 19–32)
Calcium: 10.3 mg/dL (ref 8.4–10.5)
Chloride: 99 mEq/L (ref 96–112)
Creatinine, Ser: 0.92 mg/dL (ref 0.40–1.50)
GFR: 101.44 mL/min (ref >60.00)
Glucose, Bld: 106 mg/dL — ABNORMAL HIGH (ref 70–99)
Potassium: 4 mEq/L (ref 3.5–5.1)
Sodium: 138 mEq/L (ref 135–145)

## 2020-11-20 LAB — POCT GLYCOSYLATED HEMOGLOBIN (HGB A1C): Hemoglobin A1C: 6.2 % — AB (ref 4.0–5.6)

## 2020-11-20 LAB — TSH: TSH: 1.98 u[IU]/mL (ref 0.35–4.50)

## 2020-11-20 LAB — VITAMIN B12: Vitamin B-12: 537 pg/mL (ref 211–911)

## 2020-11-20 LAB — FERRITIN: Ferritin: 49.9 ng/mL (ref 22.0–322.0)

## 2020-11-20 NOTE — Patient Instructions (Addendum)
Please let me know if the sugars are consistently <70 or >200.  Please stop at the lab.  If the results are normal, I would suggest: - Alpha Lipoic Acid 600 mg 2x a day  Please return in 3-4 months with your sugar log.   PATIENT INSTRUCTIONS FOR TYPE 2 DIABETES:  DIET AND EXERCISE Diet and exercise is an important part of diabetic treatment.  We recommended aerobic exercise in the form of brisk walking (working between 40-60% of maximal aerobic capacity, similar to brisk walking) for 150 minutes per week (such as 30 minutes five days per week) along with 3 times per week performing 'resistance' training (using various gauge rubber tubes with handles) 5-10 exercises involving the major muscle groups (upper body, lower body and core) performing 10-15 repetitions (or near fatigue) each exercise. Start at half the above goal but build slowly to reach the above goals. If limited by weight, joint pain, or disability, we recommend daily walking in a swimming pool with water up to waist to reduce pressure from joints while allow for adequate exercise.    BLOOD GLUCOSES Monitoring your blood glucoses is important for continued management of your diabetes. Please check your blood glucoses 2-4 times a day: fasting, before meals and at bedtime (you can rotate these measurements - e.g. one day check before the 3 meals, the next day check before 2 of the meals and before bedtime, etc.).   HYPOGLYCEMIA (low blood sugar) Hypoglycemia is usually a reaction to not eating, exercising, or taking too much insulin/ other diabetes drugs.  Symptoms include tremors, sweating, hunger, confusion, headache, etc. Treat IMMEDIATELY with 15 grams of Carbs: . 4 glucose tablets .  cup regular juice/soda . 2 tablespoons raisins . 4 teaspoons sugar . 1 tablespoon honey Recheck blood glucose in 15 mins and repeat above if still symptomatic/blood glucose <100.  RECOMMENDATIONS TO REDUCE YOUR RISK OF DIABETIC  COMPLICATIONS: * Take your prescribed MEDICATION(S) * Follow a DIABETIC diet: Complex carbs, fiber rich foods, (monounsaturated and polyunsaturated) fats * AVOID saturated/trans fats, high fat foods, >2,300 mg salt per day. * EXERCISE at least 5 times a week for 30 minutes or preferably daily.  * DO NOT SMOKE OR DRINK more than 1 drink a day. * Check your FEET every day. Do not wear tightfitting shoes. Contact us if you develop an ulcer * See your EYE doctor once a year or more if needed * Get a FLU shot once a year * Get a PNEUMONIA vaccine once before and once after age 71 years  GOALS:  * Your Hemoglobin A1c of <7%  * fasting sugars need to be <130 * after meals sugars need to be <180 (2h after you start eating) * Your Systolic BP should be 128 or lower  * Your Diastolic BP should be 80 or lower  * Your HDL (Good Cholesterol) should be 40 or higher  * Your LDL (Bad Cholesterol) should be 100 or lower. * Your Triglycerides should be 150 or lower  * Your Urine microalbumin (kidney function) should be <30 * Your Body Mass Index should be 25 or lower    Please consider the following ways to cut down carbs and fat and increase fiber and micronutrients in your diet: - substitute whole grain for white bread or pasta - substitute brown rice for white rice - substitute 90-calorie flat bread pieces for slices of bread when possible - substitute sweet potatoes or yams for white potatoes - substitute humus for margarine -  substitute tofu for cheese when possible - substitute almond or rice milk for regular milk (would not drink soy milk daily due to concern for soy estrogen influence on breast cancer risk) - substitute dark chocolate for other sweets when possible - substitute water - can add lemon or orange slices for taste - for diet sodas (artificial sweeteners will trick your body that you can eat sweets without getting calories and will lead you to overeating and weight gain in the long  run) - do not skip breakfast or other meals (this will slow down the metabolism and will result in more weight gain over time)  - can try smoothies made from fruit and almond/rice milk in am instead of regular breakfast - can also try old-fashioned (not instant) oatmeal made with almond/rice milk in am - order the dressing on the side when eating salad at a restaurant (pour less than half of the dressing on the salad) - eat as little meat as possible - can try juicing, but should not forget that juicing will get rid of the fiber, so would alternate with eating raw veg./fruits or drinking smoothies - use as little oil as possible, even when using olive oil - can dress a salad with a mix of balsamic vinegar and lemon juice, for e.g. - use agave nectar, stevia sugar, or regular sugar rather than artificial sweateners - steam or broil/roast veggies  - snack on veggies/fruit/nuts (unsalted, preferably) when possible, rather than processed foods - reduce or eliminate aspartame in diet (it is in diet sodas, chewing gum, etc) Read the labels!  Try to read Dr. Janene Harvey book: "Program for Reversing Diabetes" for other ideas for healthy eating.

## 2020-11-28 LAB — VITAMIN B6: Vitamin B6: 42 ng/mL — ABNORMAL HIGH (ref 2.1–21.7)

## 2020-11-30 ENCOUNTER — Encounter: Payer: Self-pay | Admitting: Internal Medicine

## 2020-12-02 ENCOUNTER — Other Ambulatory Visit: Payer: Self-pay | Admitting: Internal Medicine

## 2020-12-02 DIAGNOSIS — G6289 Other specified polyneuropathies: Secondary | ICD-10-CM

## 2020-12-05 ENCOUNTER — Other Ambulatory Visit: Payer: Self-pay | Admitting: Internal Medicine

## 2020-12-09 ENCOUNTER — Ambulatory Visit: Payer: 59 | Admitting: Family Medicine

## 2021-01-07 ENCOUNTER — Encounter: Payer: Self-pay | Admitting: Internal Medicine

## 2021-02-19 ENCOUNTER — Encounter: Payer: Self-pay | Admitting: Internal Medicine

## 2021-02-19 ENCOUNTER — Ambulatory Visit (INDEPENDENT_AMBULATORY_CARE_PROVIDER_SITE_OTHER): Payer: 59 | Admitting: Internal Medicine

## 2021-02-19 ENCOUNTER — Other Ambulatory Visit: Payer: Self-pay

## 2021-02-19 VITALS — BP 128/88 | HR 61 | Ht 76.0 in | Wt 256.8 lb

## 2021-02-19 DIAGNOSIS — E1142 Type 2 diabetes mellitus with diabetic polyneuropathy: Secondary | ICD-10-CM | POA: Diagnosis not present

## 2021-02-19 DIAGNOSIS — G6289 Other specified polyneuropathies: Secondary | ICD-10-CM | POA: Diagnosis not present

## 2021-02-19 LAB — POCT GLYCOSYLATED HEMOGLOBIN (HGB A1C): Hemoglobin A1C: 5.5 % (ref 4.0–5.6)

## 2021-02-19 NOTE — Progress Notes (Signed)
Patient ID: Tanner Frye, male   DOB: [birth year], 45 y.o.   MRN: [id number]   This visit occurred during the SARS-CoV-2 public health emergency.  Safety protocols were in place, including screening questions prior to the visit, additional usage of staff PPE, and extensive cleaning of exam room while observing appropriate contact time as indicated for disinfecting solutions.   HPI: Tanner Frye is a 45 y.o.-year-old male, referred by his PCP, Nobie Putnam *, for management of DM2, dx in 2021, Diet controlled, uncontrolled, with complications (peripheral neuropathy).  He mentions that he is mostly here because of his neuropathy.  Interim hx: He lost 15 lbs since last OV. He is working on diet (eliminated eggs as we discussed at last visit) and works out more. He still has neuropathy >> mostly feels it in the evening.  He has discomfort and even occasional pain. He started ALA 2x a day after our last visit.  He did not notice significant improvement with this.  Reviewed HbA1c: Lab Results  Component Value Date   HGBA1C 6.2 (A) 11/20/2020   HGBA1C 6.0 (A) 08/01/2020   HGBA1C 8.9 (H) 01/26/2020   HGBA1C 6.8 (H) 03/29/2019   HGBA1C 6.2 (H) 03/25/2018   Pt is not on any medication for diabetes.  Pt checks his sugars 3-4x a week: - am: 100-120 >> 116-126 - 2h after b'fast: n/c >> 130 - before lunch: n/c >> 123 - 2h after lunch: n/c >> 101 - before dinner: 90-100 >> 88-107, 134 - 2h after dinner: n/c >> 105 - bedtime: n/c >> 96 - nighttime: n/c Lowest sugar was 80 >> 88 (after workout); it is unclear if he has hypoglycemia awareness.  Highest sugar was 180 >> 134.  Glucometer: Freestyle lite  Pt's meals are: - Breakfast: 2 sugar-free jello cups + 2 eggs + 2 pieces of bacon - Lunch: prev. Fast food >> now home cooked: chicken + veggies - Dinner: same - Snacks: no desserts, apples, carrots, crackers. No sweet drinks, but water, unsweet tea. Used to drink OJ. He saw nutrition  at [facility] in the past that he felt that this is helpful.  - no CKD, last BUN/creatinine:  Lab Results  Component Value Date   BUN 12 11/20/2020   BUN 12 09/09/2020   CREATININE 0.92 11/20/2020   CREATININE 0.86 09/09/2020  On losartan 100.  -+ HL; last set of lipids: Lab Results  Component Value Date   CHOL 223 (H) 03/29/2019   HDL 49 03/29/2019   LDLCALC 141 (H) 03/29/2019   TRIG 189 (H) 03/29/2019   CHOLHDL 4.6 03/29/2019  He is not on a statin. Of note, he had a coronary calcium score of 0.  - last eye exam was in 2021. No DR reportedly.   - + numbness and tingling in his feet. No pain. Started mid 2021.  Pt has FH of prediabetes in [place].  He also has a history of transaminitis (increased ALT) and also HTN.  At last visit, we investigated his peripheral neuropathy-B6 vitamin was high: Office Visit on 11/20/2020  Component Date Value Ref Range Status  . TSH 11/20/2020 1.98  0.35 - 4.50 uIU/mL Final  . Vitamin B-12 11/20/2020 537  211 - 911 pg/mL Final  . Vitamin B6 11/20/2020 42.0* 2.1 - 21.7 ng/mL Final  . Ferritin 11/20/2020 49.9  22.0 - 322.0 ng/mL Final   We stopped his MVI.  We also started ALA 600 mg 2x a day as mentioned above.  ROS: Constitutional:  no weight gain, + weight loss, no fatigue, no subjective hyperthermia, no subjective hypothermia, no nocturia Eyes: no blurry vision, no xerophthalmia ENT: no sore throat, no nodules palpated in neck, no dysphagia, no odynophagia, no hoarseness, no tinnitus,+ hypoacusis Cardiovascular: no CP, no SOB, no palpitations, no leg swelling Respiratory: no cough, no SOB, no wheezing Gastrointestinal: no N, no V, no D, no C, no acid reflux Musculoskeletal: no muscle, no joint aches Skin: no rash, no hair loss Neurological: no tremors, + numbness and tingling/no dizziness/no HAs  I reviewed pt's medications, allergies, PMH, social hx, family hx, and changes were documented in the history of present illness. Otherwise,  unchanged from my initial visit note.  Past Medical History:  Diagnosis Date  . Allergy   . Anal fissure   . Diabetes mellitus without complication ([place]) 1610  . Family history of colorectal cancer   . GERD (gastroesophageal reflux disease)   . Hypertension 2021  . Personal history of colonic polyps   . Type 2 diabetes, controlled, with peripheral neuropathy [facility])    Past Surgical History:  Procedure Laterality Date  . COLONOSCOPY WITH PROPOFOL N/A 04/27/2017   Procedure: COLONOSCOPY WITH PROPOFOL;  Surgeon: Jonathon Bellows, MD;  Location: [facility] ENDOSCOPY;  Service: Endoscopy;  Laterality: N/A;  . COLONOSCOPY WITH PROPOFOL N/A 06/21/2020   Procedure: COLONOSCOPY WITH PROPOFOL;  Surgeon: Jonathon Bellows, MD;  Location: [facility] ENDOSCOPY;  Service: Gastroenterology;  Laterality: N/A;  . HERNIA REPAIR  1980   Social History   Socioeconomic History  . Marital status: Married    Spouse name: Not on file  . Number of children: Not on file  . Years of education: Vocational Degree  . Highest education level: Associate degree: occupational, Hotel manager, or vocational program  Occupational History  . Occupation: Armed forces operational officer  Tobacco Use  . Smoking status: Never Smoker  . Smokeless tobacco: Never Used  Vaping Use  . Vaping Use: Never used  Substance and Sexual Activity  . Alcohol use: Yes    Alcohol/week: 0.0 standard drinks    Comment: Wine or beer 1 -2  once or twice a week.  . Drug use: No  . Sexual activity: Yes    Partners: Female    Birth control/protection: Surgical  Other Topics Concern  . Not on file  Social History Narrative  . Not on file   Social Determinants of Health   Financial Resource Strain: Not on file  Food Insecurity: Not on file  Transportation Needs: Not on file  Physical Activity: Not on file  Stress: Not on file  Social Connections: Not on file  Intimate Partner Violence: Not on file   Current Outpatient Medications on File Prior to Visit  Medication Sig  Dispense Refill  . Ascorbic Acid (VITAMIN C) 1000 MG tablet Take 1,000 mg by mouth daily.    . Blood Glucose Monitoring Suppl (FREESTYLE LITE) DEVI Use to check blood sugar up to twice a day as advised. 1 each 0  . EPIPEN 2-PAK 0.3 MG/0.3ML SOAJ injection INJ 0.3 ML IM ONCE FOR 1 DOSE  0  . FREESTYLE LITE test strip Use to check blood sugar up to twice a day as advised. 200 each 3  . hydrochlorothiazide (HYDRODIURIL) 25 MG tablet Take 25 mg by mouth daily.    . hydrochlorothiazide (HYDRODIURIL) 25 MG tablet TAKE 1 TABLET BY MOUTH ONCE DAILY WITH LOSARTAN 90 tablet 2  . Lancets (FREESTYLE) lancets Use to check blood sugar up to twice a day as advised.  100 each 12  . Loratadine 10 MG CAPS     . losartan (COZAAR) 100 MG tablet Take 100 mg by mouth daily.    Marland Kitchen losartan (COZAAR) 100 MG tablet TAKE 1 TABLET BY MOUTH DAILY ALONG WITH HYDROCHLOROTHIAZIDE. 90 tablet 2  . Multiple Vitamin (MULTIVITAMIN) tablet Take 1 tablet by mouth daily.    . naproxen (NAPROSYN) 500 MG tablet TAKE 1 TABLET BY MOUTH TWICE DAILY FOR 5 DAYS, THEN MAY USE TWICE DAILY AS NEEDED AFTER THAT 60 tablet 2  . omeprazole (PRILOSEC) 20 MG capsule Take 20 mg by mouth daily.     No current facility-administered medications on file prior to visit.   Allergies  Allergen Reactions  . Bee Venom   . Oatmeal Swelling  . Other Swelling    Processed foods that are baked  . Prednisone Other (See Comments)    Other reaction(s): Other (See Comments) Effects mood verbal per patient Other reaction(s): Other (See Comments) Effects mood verbal per patient Effects mood verbal per patient Other reaction(s): Other (See Comments) Effects mood verbal per patient Effects mood verbal per patient   Family History  Problem Relation Age of Onset  . Hypertension Mother   . Hyperlipidemia Mother   . Diabetes Mother   . Rectal cancer Paternal Aunt   . Colon cancer Paternal Uncle   . Rectal cancer Brother 15  . Cancer Brother   . Prostate  cancer Neg Hx    PE: BP 128/88 (BP Location: Right Arm, Patient Position: Sitting, Cuff Size: Normal)   Pulse 61   Ht 6\' 4"  (1.93 m)   Wt 256 lb 12.8 oz (116.5 kg)   SpO2 97%   BMI 31.26 kg/m  Wt Readings from Last 3 Encounters:  02/19/21 256 lb 12.8 oz (116.5 kg)  11/20/20 271 lb 6.4 oz (123.1 kg)  10/31/20 272 lb (123.4 kg)   Constitutional: overweight, in NAD Eyes: PERRLA, EOMI, no exophthalmos ENT: moist mucous membranes, no thyromegaly, no cervical lymphadenopathy Cardiovascular: RRR, No MRG Respiratory: CTA B Gastrointestinal: abdomen soft, NT, ND, BS+ Musculoskeletal: no deformities, strength intact in all 4 Skin: moist, warm, no rashes Neurological: no tremor with outstretched hands, DTR normal in all 4  ASSESSMENT: 1. DM2, non-insulin-dependent, Diet controlled, uncontrolled, with complications - PN  2. Peripheral neuropathy  PLAN:  1. Patient with with relatively recent diagnosis of diabetes, in 2021, after period of time with prediabetes.  An HbA1c returned 8.9% in 01/2020 but afterwards, he changed his diet and sugars improved significantly, without the need for medications.  At last visit, HbA1c was only slightly worse, increased from 6.0 to 6.2%, still at goal.  We reviewed his diet and I made specific suggestions about improving this further, for example reducing fatty foods especially with breakfast, as we discussed about the concept of insulin resistance.  We also discussed about healthy snacks.  However, I did not suggest to add any medication at that time. -At today's visit, his blood sugars are all at goal and he mentions that he did not start the diet as we discussed at last visit: He eliminated eggs and switched to oatmeal in the morning.  I strongly advised him to continue this. - I suggested to:  Patient Instructions  Try L-methylfolate for neuropathy.  Please come back for a follow-up appointment in 6 months.  - we checked his HbA1c: 5.5% (lower) -  advised to check sugars at different times of the day - 1x a day, rotating check times -  advised for yearly eye exams >> he is UTD - return to clinic in 6 months  2. Peripheral neuropathy -Developed around the time when his diabetes became uncontrolled approximately a year ago. -However, his diabetes improved afterwards but at last visit, he still described almost no sensation in the front part of his sole and increased sensitivity midfoot -He tried gabapentin in the past without improvement.  -At that time, we discussed about possible causes for peripheral neuropathy, which could be related to diabetes but could also be secondary to other conditions.  Of note, he was not taking excess alcohol and did not have toxic exposures. -At last visit, we discussed about checking several labs but if these were normal, to try Lyrica or try a referral to neurology. - I did suggest alpha-lipoic acid.  He did start this and is not quite sure whether his improvement, but he does mention that she has only occasional periods of discomfort and rarely pain now -He had normal ferritin, B12, vitamin D, but an elevated B6 vitamin (see below).  At that time, he was on multivitamins.  I advised him to stop these and come back for a B6 recheck.  He did not return for labs, but we will check this today. Office Visit on 11/20/2020  Component Date Value Ref Range Status  . TSH 11/20/2020 1.98  0.35 - 4.50 uIU/mL Final  . Vitamin B-12 11/20/2020 537  211 - 911 pg/mL Final  . Vitamin B6 11/20/2020 42.0* 2.1 - 21.7 ng/mL Final  . Ferritin 11/20/2020 49.9  22.0 - 322.0 ng/mL Final  -At this visit, I also suggested L-methylfolate - he will try this.  If not helping, I advised him to let me know if he would want me to refer him to neurology -Of note, his brother also has neuropathy, but he developed this after chemotherapy.  Office Visit on 02/19/2021  Component Date Value Ref Range Status  . Vitamin B6 02/19/2021 15.0  2.1 -  21.7 ng/mL Final   Comment: Marland Kitchen Vitamin supplementation within 24 hours prior to blood draw may affect the accuracy of the results. . This test was developed and its analytical performance characteristics have been determined by [place], [place]. It has not been cleared or approved by the U.S. Food and Drug Administration. This assay has been validated pursuant to the CLIA regulations and is used for clinical purposes. .   . Hemoglobin A1C 02/19/2021 5.5  4.0 - 5.6 % Final   B6 vitamin is now normal.  Philemon Kingdom, MD PhD [facility] Endocrinology

## 2021-02-19 NOTE — Patient Instructions (Signed)
Try L-methylfolate for neuropathy.  Please come back for a follow-up appointment in 6 months.

## 2021-02-26 LAB — VITAMIN B6: Vitamin B6: 15 ng/mL (ref 2.1–21.7)

## 2021-03-04 ENCOUNTER — Other Ambulatory Visit: Payer: Self-pay

## 2021-03-04 MED FILL — Hydrochlorothiazide Tab 25 MG: ORAL | 90 days supply | Qty: 90 | Fill #0 | Status: AC

## 2021-03-04 MED FILL — Losartan Potassium Tab 100 MG: ORAL | 90 days supply | Qty: 90 | Fill #0 | Status: AC

## 2021-06-01 MED FILL — Hydrochlorothiazide Tab 25 MG: ORAL | 90 days supply | Qty: 90 | Fill #1 | Status: AC

## 2021-06-01 MED FILL — Losartan Potassium Tab 100 MG: ORAL | 90 days supply | Qty: 90 | Fill #1 | Status: AC

## 2021-06-02 ENCOUNTER — Other Ambulatory Visit: Payer: Self-pay

## 2021-07-27 ENCOUNTER — Encounter: Payer: Self-pay | Admitting: Internal Medicine

## 2021-08-26 ENCOUNTER — Other Ambulatory Visit: Payer: Self-pay | Admitting: Internal Medicine

## 2021-08-26 ENCOUNTER — Encounter: Payer: Self-pay | Admitting: Internal Medicine

## 2021-08-26 ENCOUNTER — Encounter: Payer: Self-pay | Admitting: Neurology

## 2021-08-26 ENCOUNTER — Other Ambulatory Visit: Payer: Self-pay

## 2021-08-26 ENCOUNTER — Ambulatory Visit (INDEPENDENT_AMBULATORY_CARE_PROVIDER_SITE_OTHER): Payer: 59 | Admitting: Internal Medicine

## 2021-08-26 VITALS — BP 122/78 | HR 65 | Ht 76.0 in | Wt 260.2 lb

## 2021-08-26 DIAGNOSIS — E1142 Type 2 diabetes mellitus with diabetic polyneuropathy: Secondary | ICD-10-CM

## 2021-08-26 DIAGNOSIS — Z23 Encounter for immunization: Secondary | ICD-10-CM | POA: Diagnosis not present

## 2021-08-26 DIAGNOSIS — G6289 Other specified polyneuropathies: Secondary | ICD-10-CM

## 2021-08-26 LAB — POCT GLYCOSYLATED HEMOGLOBIN (HGB A1C): Hemoglobin A1C: 5.8 % — AB (ref 4.0–5.6)

## 2021-08-26 MED ORDER — OZEMPIC (0.25 OR 0.5 MG/DOSE) 2 MG/1.5ML ~~LOC~~ SOPN
0.5000 mg | PEN_INJECTOR | SUBCUTANEOUS | 3 refills | Status: DC
  Filled 2021-08-26: qty 1.5, 28d supply, fill #0
  Filled 2021-09-23: qty 4.5, 84d supply, fill #1
  Filled 2022-01-11: qty 4.5, 84d supply, fill #2

## 2021-08-26 NOTE — Addendum Note (Signed)
Addended by: Lauralyn Primes on: 08/26/2021 09:32 AM   Modules accepted: Orders

## 2021-08-26 NOTE — Progress Notes (Signed)
Patient ID: SHLOMO SERES, male   DOB: 12/28/1975, 45 y.o.   MRN: [id number]   This visit occurred during the SARS-CoV-2 public health emergency.  Safety protocols were in place, including screening questions prior to the visit, additional usage of staff PPE, and extensive cleaning of exam room while observing appropriate contact time as indicated for disinfecting solutions.   HPI: MATTHE SLOANE is a 45 y.o.-year-old male, initially referred by his PCP, Nobie Putnam *, returning for follow-up for DM2, dx in 2021, Diet controlled, uncontrolled, with complications (peripheral neuropathy).  At last visit, he mentioned that he was mostly here because of his neuropathy.  Interim hx: He continues to have neuropathy discomfort in his feet and occasional pain.  Alpha-lipoic acid twice a day did not help significantly. Also, L-me-folate did not help.  He feels exercising helps and his symptoms are increased if he takes a break from exercising for more than 2 to 3 days. He contacted me since last visit requesting Ozempic.  Discussed about waiting until the visit to see if we need to use this. No increased urination, blurry vision, nausea, chest pain.  Reviewed HbA1c: Lab Results  Component Value Date   HGBA1C 5.5 02/19/2021   HGBA1C 6.2 (A) 11/20/2020   HGBA1C 6.0 (A) 08/01/2020   HGBA1C 8.9 (H) 01/26/2020   HGBA1C 6.8 (H) 03/29/2019   HGBA1C 6.2 (H) 03/25/2018   Pt is not on any medication for diabetes.  Pt checks his sugars 0-1x a day: - am: 100-120 >> 116-126 >> 115-120 - 2h after b'fast: n/c >> 130 >> n/c - before lunch: n/c >> 123 >> 90s - 2h after lunch: n/c >> 101 >> n/c - before dinner: 90-100 >> 88-107, 134 >> 90s - 2h after dinner: n/c >> 105 >> 115-120 - bedtime: n/c >> 96 >> n/c - nighttime: n/c Lowest sugar was 80 >> 88 (after workout) >> 80s; it is unclear if he has hypoglycemia awareness.  Highest sugar was 180 >> 134 >> 120s.  Glucometer: Freestyle lite  Pt's  meals are: - Breakfast: 2 sugar-free jello cups + 2 eggs + 2 pieces of bacon >> oatmeal - Lunch: prev. Fast food >> now home cooked: chicken + veggies - Dinner: same - Snacks: no desserts, apples, carrots, crackers. No sweet drinks, but water, unsweet tea. Used to drink OJ. He saw nutrition at [facility] in the past -he felt that this was helpful.  - no CKD, last BUN/creatinine:  Lab Results  Component Value Date   BUN 12 11/20/2020   BUN 12 09/09/2020   CREATININE 0.92 11/20/2020   CREATININE 0.86 09/09/2020  On losartan 100.  -+ HL; last set of lipids: Lab Results  Component Value Date   CHOL 223 (H) 03/29/2019   HDL 49 03/29/2019   LDLCALC 141 (H) 03/29/2019   TRIG 189 (H) 03/29/2019   CHOLHDL 4.6 03/29/2019  He is not on a statin. Of note, he had a coronary calcium score of 0.  - last eye exam was in 2021. No DR reportedly. Coming up 08/2021.  - + numbness and tingling in his feet. No pain. Started mid 2021.  Pt has FH of prediabetes in M.  He also has a history of transaminitis (increased ALT) and also HTN.  At last visit, we investigated his peripheral neuropathy-B6 vitamin was high, but normalized after stopping multivitamins: Office Visit on 02/19/2021  Component Date Value Ref Range Status   Vitamin B6 02/19/2021 15.0  2.1 - 21.7 ng/mL  Final   Office Visit on 11/20/2020  Component Date Value Ref Range Status   TSH 11/20/2020 1.98  0.35 - 4.50 uIU/mL Final   Vitamin B-12 11/20/2020 537  211 - 911 pg/mL Final   Vitamin B6 11/20/2020 42.0* 2.1 - 21.7 ng/mL Final   Ferritin 11/20/2020 49.9  22.0 - 322.0 ng/mL Final   We also started ALA 600 mg 2x a day as mentioned above.  ROS: + see HPI Neurological: no tremors, + numbness and tingling/no dizziness/no HAs  I reviewed pt's medications, allergies, PMH, social hx, family hx, and changes were documented in the history of present illness. Otherwise, unchanged from my initial visit note.  Past Medical History:   Diagnosis Date   Allergy    Anal fissure    Diabetes mellitus without complication ([place]) 1884   Family history of colorectal cancer    GERD (gastroesophageal reflux disease)    Hypertension 2021   Personal history of colonic polyps    Type 2 diabetes, controlled, with peripheral neuropathy ([place])    Past Surgical History:  Procedure Laterality Date   COLONOSCOPY WITH PROPOFOL N/A 04/27/2017   Procedure: COLONOSCOPY WITH PROPOFOL;  Surgeon: Jonathon Bellows, MD;  [facility] ENDOSCOPY;  Service: Endoscopy;  Laterality: N/A;   COLONOSCOPY WITH PROPOFOL N/A 06/21/2020   Procedure: COLONOSCOPY WITH PROPOFOL;  Surgeon: Jonathon Bellows, MD;  Location: [facility] ENDOSCOPY;  Service: Gastroenterology;  Laterality: N/A;   HERNIA REPAIR  1980   Social History   Socioeconomic History   Marital status: Married    Spouse name: Not on file   Number of children: Not on file   Years of education: Vocational Degree   Highest education level: Associate degree: occupational, Hotel manager, or vocational program  Occupational History   Occupation: Armed forces operational officer  Tobacco Use   Smoking status: Never   Smokeless tobacco: Never  Vaping Use   Vaping Use: Never used  Substance and Sexual Activity   Alcohol use: Yes    Alcohol/week: 0.0 standard drinks    Comment: Wine or beer 1 -2  once or twice a week.   Drug use: No   Sexual activity: Yes    Partners: Female    Birth control/protection: Surgical  Other Topics Concern   Not on file  Social History Narrative   Not on file   Social Determinants of Health   Financial Resource Strain: Not on file  Food Insecurity: Not on file  Transportation Needs: Not on file  Physical Activity: Not on file  Stress: Not on file  Social Connections: Not on file  Intimate Partner Violence: Not on file   Current Outpatient Medications on File Prior to Visit  Medication Sig Dispense Refill   Ascorbic Acid (VITAMIN C) 1000 MG tablet Take 1,000 mg by mouth daily.     Blood  Glucose Monitoring Suppl (FREESTYLE LITE) DEVI Use to check blood sugar up to twice a day as advised. 1 each 0   EPIPEN 2-PAK 0.3 MG/0.3ML SOAJ injection INJ 0.3 ML IM ONCE FOR 1 DOSE  0   FREESTYLE LITE test strip Use to check blood sugar up to twice a day as advised. 200 each 3   hydrochlorothiazide (HYDRODIURIL) 25 MG tablet Take 25 mg by mouth daily.     hydrochlorothiazide (HYDRODIURIL) 25 MG tablet TAKE 1 TABLET BY MOUTH ONCE DAILY WITH LOSARTAN 90 tablet 2   Lancets (FREESTYLE) lancets Use to check blood sugar up to twice a day as advised. 100 each 12  Loratadine 10 MG CAPS      losartan (COZAAR) 100 MG tablet Take 100 mg by mouth daily.     losartan (COZAAR) 100 MG tablet TAKE 1 TABLET BY MOUTH DAILY ALONG WITH HYDROCHLOROTHIAZIDE. 90 tablet 2   Multiple Vitamin (MULTIVITAMIN) tablet Take 1 tablet by mouth daily.     naproxen (NAPROSYN) 500 MG tablet TAKE 1 TABLET BY MOUTH TWICE DAILY FOR 5 DAYS, THEN MAY USE TWICE DAILY AS NEEDED AFTER THAT 60 tablet 2   omeprazole (PRILOSEC) 20 MG capsule Take 20 mg by mouth daily.     No current facility-administered medications on file prior to visit.   Allergies  Allergen Reactions   Bee Venom    Oatmeal Swelling   Other Swelling    Processed foods that are baked   Prednisone Other (See Comments)    Other reaction(s): Other (See Comments) Effects mood verbal per patient Other reaction(s): Other (See Comments) Effects mood verbal per patient Effects mood verbal per patient Other reaction(s): Other (See Comments) Effects mood verbal per patient Effects mood verbal per patient   Family History  Problem Relation Age of Onset   Hypertension Mother    Hyperlipidemia Mother    Diabetes Mother    Rectal cancer Paternal Aunt    Colon cancer Paternal Uncle    Rectal cancer Brother 55   Cancer Brother    Prostate cancer Neg Hx    PE: BP 122/78   Pulse 65   Ht 6\' 4"  (1.93 m)   Wt 260 lb 3.2 oz (118 kg)   SpO2 95%   BMI 31.67 kg/m   Wt Readings from Last 3 Encounters:  08/26/21 260 lb 3.2 oz (118 kg)  02/19/21 256 lb 12.8 oz (116.5 kg)  11/20/20 271 lb 6.4 oz (123.1 kg)   Constitutional: overweight, in NAD Eyes: PERRLA, EOMI, no exophthalmos ENT: moist mucous membranes, no thyromegaly, no cervical lymphadenopathy Cardiovascular: RRR, No MRG Respiratory: CTA B Gastrointestinal: abdomen soft, NT, ND, BS+ Musculoskeletal: no deformities, strength intact in all 4 Skin: moist, warm, no rashes Neurological: no tremor with outstretched hands, DTR normal in all 4  ASSESSMENT: 1. DM2, non-insulin-dependent, Diet controlled, uncontrolled, with complications - PN  2. Peripheral neuropathy  PLAN:  1. Patient with relatively recent diabetes diagnosis in 2021, after period of time with prediabetes.  In 01/2020, HbA1c returned high, at 8.9%, but afterwards, after he changed his diet, sugars improved significantly without the need for medications.  At last visit, HbA1c was excellent, at 5.5%.  We continued without diabetic medications but discussed about improving diet, especially about healthy snacks.  I also advised him about reducing fatty foods especially with breakfast at our previous visit so at last visit he eliminated eggs and switched to oatmeal in the morning.  Sugars were all at goal.  Also, he lost 15 pounds before last visit. -At today's visit, sugars are all at goal, without hypo or hyperglycemic exceptions. -He inquires about Ozempic.  We discussed that we would use this mostly for its weight loss effect, but he can also help with diabetes control. - I suggested to:  Patient Instructions  Please start Ozempic 0.25 mg weekly in a.m. (for example on Sunday morning) x 4 weeks, then increase to 0.5 mg weekly in a.m. if no nausea or hypoglycemia.  Please come back for a follow-up appointment in 6 months.  - we checked his HbA1c: 5.8%  - advised to check sugars at different times of the day - 1x  a day, rotating check  times - advised for yearly eye exams >> he has an appointment coming up - return to clinic in 6 months  2. Peripheral neuropathy -Developed around the time when his diabetes became uncontrolled but his diabetes control improved afterwards with persistent neuropathy symptoms.  At last visit he described no sensation in the front part of his sole and increase sensitivity mid foot. -Symptoms improved after exercising -However, he occasionally has significant discomfort from this -He tried gabapentin in the past without improvement -No history of excess alcohol or toxic exposures -We checked his TSH, B12, ferritin and they were normal.  However, B6 vitamin level was twice the upper normal range.  At that time, he was on multivitamins.  I advised him to stop.  A repeat B6 vitamin level was normal afterwards: Office Visit on 02/19/2021  Component Date Value Ref Range Status   Vitamin B6 02/19/2021 15.0  2.1 - 21.7 ng/mL Final   Office Visit on 11/20/2020  Component Date Value Ref Range Status   TSH 11/20/2020 1.98  0.35 - 4.50 uIU/mL Final   Vitamin B-12 11/20/2020 537  211 - 911 pg/mL Final   Vitamin B6 11/20/2020 42.0* 2.1 - 21.7 ng/mL Final   Ferritin 11/20/2020 49.9  22.0 - 322.0 ng/mL Final  -She tried alpha-lipoic acid but did not feel that this caused a clear improvement in his symptoms. -At last visit, we also discussed about L-methylfolate and advised him to try this -We did discuss that if this did not help, he can try Lyrica or a referral to neurology.   -At this visit, he agrees with a referral to neurology. -Of note, his brother also developed neuropathy, but after chemotherapy   Philemon Kingdom, MD PhD [facility] Endocrinology

## 2021-08-26 NOTE — Addendum Note (Signed)
Addended by: Lauralyn Primes on: 08/26/2021 09:25 AM   Modules accepted: Orders

## 2021-08-26 NOTE — Patient Instructions (Addendum)
Please start Ozempic 0.25 mg weekly in a.m. (for example on Sunday morning) x 4 weeks, then increase to 0.5 mg weekly in a.m. if no nausea or hypoglycemia.  Please come back for a follow-up appointment in 6 months.

## 2021-08-26 NOTE — Progress Notes (Signed)
Pi

## 2021-09-01 ENCOUNTER — Other Ambulatory Visit: Payer: Self-pay | Admitting: Cardiovascular Disease

## 2021-09-01 ENCOUNTER — Other Ambulatory Visit: Payer: Self-pay

## 2021-09-01 MED ORDER — HYDROCHLOROTHIAZIDE 25 MG PO TABS
ORAL_TABLET | ORAL | 2 refills | Status: DC
  Filled 2021-09-01: qty 90, 90d supply, fill #0
  Filled 2021-12-01: qty 90, 90d supply, fill #1

## 2021-09-01 MED ORDER — LOSARTAN POTASSIUM 100 MG PO TABS
ORAL_TABLET | ORAL | 2 refills | Status: DC
  Filled 2021-09-01: qty 90, 90d supply, fill #0
  Filled 2021-12-01: qty 90, 90d supply, fill #1
  Filled 2022-03-19: qty 30, 30d supply, fill #2

## 2021-09-01 NOTE — Telephone Encounter (Signed)
Patient needs an appt for further refills, Thanks!   Patient was LS 10/2020 -- follow up in 6 months

## 2021-09-23 ENCOUNTER — Other Ambulatory Visit: Payer: Self-pay

## 2021-10-03 ENCOUNTER — Ambulatory Visit: Payer: 59 | Admitting: Neurology

## 2021-10-04 ENCOUNTER — Telehealth: Payer: 59 | Admitting: Nurse Practitioner

## 2021-10-04 DIAGNOSIS — J069 Acute upper respiratory infection, unspecified: Secondary | ICD-10-CM

## 2021-10-04 MED ORDER — FLUTICASONE PROPIONATE 50 MCG/ACT NA SUSP: 2.0000 | Freq: Every day | NASAL | 6 refills | Status: DC

## 2021-10-04 MED ORDER — BENZONATATE 100 MG PO CAPS: 100.0000 mg | ORAL_CAPSULE | Freq: Three times a day (TID) | ORAL | 0 refills | Status: DC | PRN

## 2021-10-04 NOTE — Progress Notes (Signed)
E-Visit for Upper Respiratory Infection   We are sorry you are not feeling well.  Here is how we plan to help!  Based on what you have shared with me, it looks like you may have a viral upper respiratory infection.  Upper respiratory infections are caused by a large number of viruses; however, rhinovirus is the most common cause.   It is a good idea since symptom onset was in the past 2 days to take an at home COVID test as these symptoms are similar to those of COVID. If you do test positive for COVID please follow up and let us know.   Providers prescribe antibiotics to treat infections caused by bacteria. Antibiotics are very powerful in treating bacterial infections when they are used properly. To maintain their effectiveness, they should be used only when necessary. Overuse of antibiotics has resulted in the development of superbugs that are resistant to treatment!    After careful review of your answers, I would not recommend an antibiotic for your condition.  Antibiotics are not effective against viruses and therefore should not be used to treat them. Common examples of infections caused by viruses include colds and flu   Symptoms vary from person to person, with common symptoms including sore throat, cough, fatigue or lack of energy and feeling of general discomfort.  A low-grade fever of up to 100.4 may present, but is often uncommon.  Symptoms vary however, and are closely related to a person's age or underlying illnesses.  The most common symptoms associated with an upper respiratory infection are nasal discharge or congestion, cough, sneezing, headache and pressure in the ears and face.  These symptoms usually persist for about 3 to 10 days, but can last up to 2 weeks.  It is important to know that upper respiratory infections do not cause serious illness or complications in most cases.    Upper respiratory infections can be transmitted from person to person, with the most common method of  transmission being a person's hands.  The virus is able to live on the skin and can infect other persons for up to 2 hours after direct contact.  Also, these can be transmitted when someone coughs or sneezes; thus, it is important to cover the mouth to reduce this risk.  To keep the spread of the illness at [place], good hand hygiene is very important.  This is an infection that is most likely caused by a virus. There are no specific treatments other than to help you with the symptoms until the infection runs its course.  We are sorry you are not feeling well.  Here is how we plan to help!   For nasal congestion, you may use an oral decongestants such as Mucinex D or if you have glaucoma or high blood pressure use plain Mucinex.  Saline nasal spray or nasal drops can help and can safely be used as often as needed for congestion.  For your congestion, I have prescribed Fluticasone nasal spray one spray in each nostril twice a day  If you do not have a history of heart disease, hypertension, diabetes or thyroid disease, prostate/bladder issues or glaucoma, you may also use Sudafed to treat nasal congestion.  It is highly recommended that you consult with a pharmacist or your primary care physician to ensure this medication is safe for you to take.     If you have a cough, you may use cough suppressants such as Delsym and Robitussin.  If you have  glaucoma or high blood pressure, you can also use Coricidin HBP.   For cough I have prescribed for you A prescription cough medication called Tessalon Perles 100 mg. You may take 1-2 capsules every 8 hours as needed for cough  If you have a sore or scratchy throat, use a saltwater gargle-  to  teaspoon of salt dissolved in a 4-ounce to 8-ounce glass of warm water.  Gargle the solution for approximately 15-30 seconds and then spit.  It is important not to swallow the solution.  You can also use throat lozenges/cough drops and Chloraseptic spray to help with throat  pain or discomfort.  Warm or cold liquids can also be helpful in relieving throat pain.  For headache, pain or general discomfort, you can use Ibuprofen or Tylenol as directed.   Some authorities believe that zinc sprays or the use of Echinacea may shorten the course of your symptoms.   HOME CARE Only take medications as instructed by your medical team. Be sure to drink plenty of fluids. Water is fine as well as fruit juices, sodas and electrolyte beverages. You may want to stay away from caffeine or alcohol. If you are nauseated, try taking small sips of liquids. How do you know if you are getting enough fluid? Your urine should be a pale yellow or almost colorless. Get rest. Taking a steamy shower or using a humidifier may help nasal congestion and ease sore throat pain. You can place a towel over your head and breathe in the steam from hot water coming from a faucet. Using a saline nasal spray works much the same way. Cough drops, hard candies and sore throat lozenges may ease your cough. Avoid close contacts especially the very young and the elderly Cover your mouth if you cough or sneeze Always remember to wash your hands.   GET HELP RIGHT AWAY IF: You develop worsening fever. If your symptoms do not improve within 10 days You develop yellow or green discharge from your nose over 3 days. You have coughing fits You develop a severe head ache or visual changes. You develop shortness of breath, difficulty breathing or start having chest pain Your symptoms persist after you have completed your treatment plan  MAKE SURE YOU  Understand these instructions. Will watch your condition. Will get help right away if you are not doing well or get worse.  Thank you for choosing an e-visit.  Your e-visit answers were reviewed by a board certified advanced clinical practitioner to complete your personal care plan. Depending upon the condition, your plan could have included both over the counter  or prescription medications.  Please review your pharmacy choice. Make sure the pharmacy is open so you can pick up prescription now. If there is a problem, you may contact your provider through [employer] and have the prescription routed to another pharmacy.  Your safety is important to [place]. If you have drug allergies check your prescription carefully.   For the next 24 hours you can use MyChart to ask questions about today's visit, request a non-urgent call back, or ask for a work or school excuse. You will get an email in the next two days asking about your experience. I hope that your e-visit has been valuable and will speed your recovery.  I spent approximately 7 minutes reviewing the patient's history, current symptoms and coordinating their plan of care today.    Meds ordered this encounter  Medications   fluticasone (FLONASE) 50 MCG/ACT nasal spray  Sig: Place 2 sprays into both nostrils daily.    Dispense:  16 g    Refill:  6   benzonatate (TESSALON) 100 MG capsule    Sig: Take 1 capsule (100 mg total) by mouth 3 (three) times daily as needed.    Dispense:  30 capsule    Refill:  0

## 2021-12-01 ENCOUNTER — Other Ambulatory Visit: Payer: Self-pay

## 2021-12-04 ENCOUNTER — Telehealth: Payer: Self-pay

## 2021-12-04 NOTE — Telephone Encounter (Signed)
Pt's wife, Estill Bamberg calling to schedule pt's repeat colonoscopy. Pt received his repeat letter. Pt needs this in July and is requesting a Wednesday or Friday. Please call spouse at [phone].

## 2021-12-05 ENCOUNTER — Other Ambulatory Visit: Payer: Self-pay

## 2021-12-05 DIAGNOSIS — Z86010 Personal history of colonic polyps: Secondary | ICD-10-CM

## 2021-12-05 DIAGNOSIS — Z800 Family history of malignant neoplasm of digestive organs: Secondary | ICD-10-CM

## 2021-12-05 MED ORDER — CLENPIQ 10-3.5-12 MG-GM -GM/160ML PO SOLN
1.0000 | Freq: Once | ORAL | 0 refills | Status: AC
  Filled 2021-12-05: qty 320, 1d supply, fill #0

## 2021-12-05 NOTE — Telephone Encounter (Signed)
Procedure has been scheduled for 04/24/22.

## 2021-12-05 NOTE — Progress Notes (Signed)
Gastroenterology Pre-Procedure Review  Request Date: 04/24/2022 Requesting Physician: Dr. Vicente Males  PATIENT REVIEW QUESTIONS: The patient responded to the following health history questions as indicated:  3 year recall  1. Are you having any GI issues? no 2. Do you have a personal history of Polyps? yes (06/2020 polyps removed.) 3. Do you have a family history of Colon Cancer or Polyps? yes (brother: colon cancer) 4. Diabetes Mellitus? yes (Type II) 5. Joint replacements in the past 12 months?no 6. Major health problems in the past 3 months?no 7. Any artificial heart valves, MVP, or defibrillator?no    MEDICATIONS & ALLERGIES:    Patient reports the following regarding taking any anticoagulation/antiplatelet therapy:   Plavix, Coumadin, Eliquis, Xarelto, Lovenox, Pradaxa, Brilinta, or Effient? no Aspirin? no  Patient confirms/reports the following medications:  Current Outpatient Medications  Medication Sig Dispense Refill   Ascorbic Acid (VITAMIN C) 1000 MG tablet Take 1,000 mg by mouth daily.     benzonatate (TESSALON) 100 MG capsule Take 1 capsule (100 mg total) by mouth 3 (three) times daily as needed. 30 capsule 0   Blood Glucose Monitoring Suppl (FREESTYLE LITE) DEVI Use to check blood sugar up to twice a day as advised. 1 each 0   EPIPEN 2-PAK 0.3 MG/0.3ML SOAJ injection INJ 0.3 ML IM ONCE FOR 1 DOSE  0   fluticasone (FLONASE) 50 MCG/ACT nasal spray Place 2 sprays into both nostrils daily. 16 g 6   FREESTYLE LITE test strip Use to check blood sugar up to twice a day as advised. 200 each 3   hydrochlorothiazide (HYDRODIURIL) 25 MG tablet Take 25 mg by mouth daily.     hydrochlorothiazide (HYDRODIURIL) 25 MG tablet TAKE 1 TABLET BY MOUTH ONCE DAILY WITH LOSARTAN 90 tablet 2   Lancets (FREESTYLE) lancets Use to check blood sugar up to twice a day as advised. 100 each 12   Loratadine 10 MG CAPS      losartan (COZAAR) 100 MG tablet Take 100 mg by mouth daily.     losartan (COZAAR) 100  MG tablet TAKE 1 TABLET BY MOUTH DAILY ALONG WITH HYDROCHLOROTHIAZIDE. 90 tablet 2   omeprazole (PRILOSEC) 20 MG capsule Take 10 mg by mouth daily.     Semaglutide,0.25 or 0.5MG /DOS, (OZEMPIC, 0.25 OR 0.5 MG/DOSE,) 2 MG/1.5ML SOPN Inject 0.5 mg into the skin once a week. 4.5 mL 3   No current facility-administered medications for this visit.    Patient confirms/reports the following allergies:  Allergies  Allergen Reactions   Bee Venom    Oatmeal Swelling   Other Swelling    Processed foods that are baked   Prednisone Other (See Comments)    Other reaction(s): Other (See Comments) Effects mood verbal per patient Other reaction(s): Other (See Comments) Effects mood verbal per patient Effects mood verbal per patient Other reaction(s): Other (See Comments) Effects mood verbal per patient Effects mood verbal per patient    No orders of the defined types were placed in this encounter.   AUTHORIZATION INFORMATION Primary Insurance: 1D#: Group #:  Secondary Insurance: 1D#: Group #:  SCHEDULE INFORMATION: Date: 04/24/2022 Time: Location: [place]

## 2021-12-16 NOTE — Progress Notes (Signed)
?[facility] ?Neurology Division ?Clinic Note - Initial Visit ? ? ?Date: 12/17/21 ? ?Tanner Frye ?MRN: [id number] ?DOB: 1975-11-13 ? ? ?Dear Dr, Parks Ranger: ? ?Thank you for your kind referral of MELIK BLANCETT for consultation of bilateral feet pain. Although his history is well known to you, please allow [place] to reiterate it for the purpose of our medical record. The patient was accompanied to the clinic by self.  ? ?History of Present Illness: ?Tanner Frye is a 46 y.o. right-handed male with well-controlled diabetes mellitus, hypertension, and GERD presenting for evaluation of bilateral feet pain.  ? ?Starting around 2022, he began having numbness involving the toes and soles of the feet.  Symptoms are constant.  He feels that exercise tends to help his symptoms.  He also complains of cold sensation and pain in his toenails, as if his toe nails are being pulled.  He has previously tried gabapentin 100mg  which did not help.  ? ?No imbalance, falls, or weakness.  ? 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  He lives with wife in a 2-level home.  Nonsmoker.  He drinks a beer about once per week. No family history of neuropathy.  ? ?Out-side paper records, electronic medical record, and images have been reviewed where available and summarized as:  ?Lab Results  ?Component Value Date  ? HGBA1C 5.8 (A) 08/26/2021  ? ?Lab Results  ?Component Value Date  ? [id number] 537 11/20/2020  ? ?Lab Results  ?Component Value Date  ? TSH 1.98 11/20/2020  ? ? ?Past Medical History:  ?Diagnosis Date  ? Allergy   ? Anal fissure   ? Diabetes mellitus without complication ([place]) 7902  ? Family history of colorectal cancer   ? GERD (gastroesophageal reflux disease)   ? Hypertension 2021  ? Personal history of colonic polyps   ? Type 2 diabetes, controlled, with peripheral neuropathy ([place])   ? ? ?Past Surgical History:  ?Procedure Laterality Date  ? COLONOSCOPY WITH PROPOFOL N/A  04/27/2017  ? Procedure: COLONOSCOPY WITH PROPOFOL;  Surgeon: Jonathon Bellows, MD;  Location: [facility] ENDOSCOPY;  Service: Endoscopy;  Laterality: N/A;  ? COLONOSCOPY WITH PROPOFOL N/A 06/21/2020  ? Procedure: COLONOSCOPY WITH PROPOFOL;  Surgeon: Jonathon Bellows, MD;  Location: [facility] ENDOSCOPY;  Service: Gastroenterology;  Laterality: N/A;  ? HERNIA REPAIR  1980  ? ? ? ?Medications:  ?Outpatient Encounter Medications as of 12/17/2021  ?Medication Sig  ? Ascorbic Acid (VITAMIN C) 1000 MG tablet Take 1,000 mg by mouth daily.  ? Blood Glucose Monitoring Suppl (FREESTYLE LITE) DEVI Use to check blood sugar up to twice a day as advised.  ? EPIPEN 2-PAK 0.3 MG/0.3ML SOAJ injection INJ 0.3 ML IM ONCE FOR 1 DOSE  ? FREESTYLE LITE test strip Use to check blood sugar up to twice a day as advised.  ? hydrochlorothiazide (HYDRODIURIL) 25 MG tablet TAKE 1 TABLET BY MOUTH ONCE DAILY WITH LOSARTAN  ? Lancets (FREESTYLE) lancets Use to check blood sugar up to twice a day as advised.  ? Loratadine 10 MG CAPS   ? losartan (COZAAR) 100 MG tablet TAKE 1 TABLET BY MOUTH DAILY ALONG WITH HYDROCHLOROTHIAZIDE.  ? omeprazole (PRILOSEC) 20 MG capsule Take 10 mg by mouth daily.  ? Semaglutide,0.25 or 0.5MG /DOS, (OZEMPIC, 0.25 OR 0.5 MG/DOSE,) 2 MG/1.5ML SOPN Inject 0.5 mg into the skin once a week.  ? benzonatate (TESSALON) 100 MG capsule Take 1 capsule (100 mg total) by mouth 3 (three) times daily  as needed. (Patient not taking: Reported on 12/17/2021)  ? fluticasone (FLONASE) 50 MCG/ACT nasal spray Place 2 sprays into both nostrils daily. (Patient not taking: Reported on 12/17/2021)  ? [DISCONTINUED] hydrochlorothiazide (HYDRODIURIL) 25 MG tablet Take 25 mg by mouth daily. (Patient not taking: Reported on 12/17/2021)  ? [DISCONTINUED] losartan (COZAAR) 100 MG tablet Take 100 mg by mouth daily. (Patient not taking: Reported on 12/17/2021)  ? ?No facility-administered encounter medications on file as of 12/17/2021.  ? ? ?Allergies:  ?Allergies  ?Allergen Reactions  ?  Bee Venom   ? Oatmeal Swelling  ? Other Swelling  ?  Processed foods that are baked  ? Prednisone Other (See Comments)  ?  Other reaction(s): Other (See Comments) ?Effects mood verbal per patient ?Other reaction(s): Other (See Comments) ?Effects mood verbal per patient ?Effects mood verbal per patient ?Other reaction(s): Other (See Comments) ?Effects mood verbal per patient ?Effects mood verbal per patient  ? ? ?Family History: ?Family History  ?Problem Relation Age of Onset  ? Hypertension Mother   ? Hyperlipidemia Mother   ? Rectal cancer Brother 17  ? Cancer Brother   ? Rectal cancer Paternal Aunt   ? Colon cancer Paternal Uncle   ? Prostate cancer Neg Hx   ? ? ?Social History: ?Social History  ? ?Tobacco Use  ? Smoking status: Never  ? Smokeless tobacco: Never  ?Vaping Use  ? Vaping Use: Never used  ?Substance Use Topics  ? Alcohol use: Yes  ?  Alcohol/week: 0.0 standard drinks  ?  Comment: Wine or beer 1 -2  once or twice a week.  ? Drug use: No  ? ?Social History  ? ?Social History Narrative  ? Right Handed   ? Lives in a two story home  ? ? ?Vital Signs:  ?BP 121/81   Pulse 70   Ht 6\' 4"  (1.93 m)   Wt 242 lb (109.8 kg)   SpO2 97%   BMI 29.46 kg/m?  ? ?Neurological Exam: ?MENTAL STATUS including orientation to time, place, person, recent and remote memory, attention span and concentration, language, and fund of knowledge is normal.  Speech is not dysarthric. ? ?CRANIAL NERVES: ?II:  No visual field defects.   ?III-IV-VI: Pupils equal round and reactive to light.  Normal conjugate, extra-ocular eye movements in all directions of gaze.  No nystagmus.  No ptosis.   ?V:  Normal facial sensation.    ?VII:  Normal facial symmetry and movements.   ?VIII:  Normal hearing and vestibular function.   ?IX-X:  Normal palatal movement.   ?XI:  Normal shoulder shrug and head rotation.   ?XII:  Normal tongue strength and range of motion, no deviation or fasciculation. ? ?MOTOR:  No atrophy, fasciculations or abnormal  movements.  No pronator drift.  ? ?Upper Extremity:  Right  Left  ?Deltoid  5/5   5/5   ?Biceps  5/5   5/5   ?Triceps  5/5   5/5   ?Wrist extensors  5/5   5/5   ?Wrist flexors  5/5   5/5   ?Finger extensors  5/5   5/5   ?Finger flexors  5/5   5/5   ?Dorsal interossei  5/5   5/5   ?Abductor pollicis  5/5   5/5   ?Tone (Ashworth scale)  0  0  ? ?Lower Extremity:  Right  Left  ?Hip flexors  5/5   5/5   ?Knee flexors  5/5   5/5   ?Knee  extensors  5/5   5/5   ?Dorsiflexors  5/5   5/5   ?Plantarflexors  5/5   5/5   ?Toe extensors  5/5   5/5   ?Toe flexors  5/5   5/5   ?Tone (Ashworth scale)  0  0  ? ?MSRs:  ?Right        Left                  ?brachioradialis 2+  2+  ?biceps 2+  2+  ?triceps 2+  2+  ?patellar 2+  2+  ?ankle jerk 2+  2+  ?Hoffman no  no  ?plantar response down  down  ? ?SENSORY:  Pin prick reduced over the toes bilaterally, otherwise vibration and temperature is normal throughout.  Romberg's sign absent.  ? ?COORDINATION/GAIT: Normal finger-to- nose-finger.  Intact rapid alternating movements bilaterally.  Gait narrow based and stable. Tandem and stressed gait intact.  ? ? ?IMPRESSION: ?Bilateral feet numbness most suggestive of mild, early distal neuropathy.  Risk factors include diabetes, however this is very well-controlled.  He does not have pain or ataxia.  No role of medications such as gabapentin, as this is not effective in managing numbness.  NCS/EMG offered and he will consider this, if symptoms get worse.  Management strategies to minimize progression was discussed, such as maintaining optimal control of diabetes, which he is already doing. Management is supportive. ? ?Return to clinic as needed ? ? ?Thank you for allowing me to participate in patient's care.  If I can answer any additional questions, I would be pleased to do so.   ? ?Sincerely, ? ? ? ?Shlomie Romig K. Posey Pronto, DO ? ?

## 2021-12-17 ENCOUNTER — Ambulatory Visit (INDEPENDENT_AMBULATORY_CARE_PROVIDER_SITE_OTHER): Payer: 59 | Admitting: Neurology

## 2021-12-17 ENCOUNTER — Other Ambulatory Visit: Payer: Self-pay

## 2021-12-17 ENCOUNTER — Encounter: Payer: Self-pay | Admitting: Neurology

## 2021-12-17 VITALS — BP 121/81 | HR 70 | Ht 76.0 in | Wt 242.0 lb

## 2021-12-17 DIAGNOSIS — G629 Polyneuropathy, unspecified: Secondary | ICD-10-CM

## 2021-12-17 NOTE — Patient Instructions (Signed)
Return to clinic if your symptoms get worse ?

## 2021-12-30 DIAGNOSIS — D225 Melanocytic nevi of trunk: Secondary | ICD-10-CM | POA: Diagnosis not present

## 2021-12-30 DIAGNOSIS — Z872 Personal history of diseases of the skin and subcutaneous tissue: Secondary | ICD-10-CM | POA: Diagnosis not present

## 2021-12-30 DIAGNOSIS — L738 Other specified follicular disorders: Secondary | ICD-10-CM | POA: Diagnosis not present

## 2021-12-30 DIAGNOSIS — D485 Neoplasm of uncertain behavior of skin: Secondary | ICD-10-CM | POA: Diagnosis not present

## 2021-12-30 DIAGNOSIS — L578 Other skin changes due to chronic exposure to nonionizing radiation: Secondary | ICD-10-CM | POA: Diagnosis not present

## 2022-01-12 ENCOUNTER — Other Ambulatory Visit: Payer: Self-pay

## 2022-03-18 ENCOUNTER — Encounter: Payer: Self-pay | Admitting: Internal Medicine

## 2022-03-18 ENCOUNTER — Ambulatory Visit (INDEPENDENT_AMBULATORY_CARE_PROVIDER_SITE_OTHER): Payer: 59 | Admitting: Internal Medicine

## 2022-03-18 ENCOUNTER — Other Ambulatory Visit: Payer: Self-pay

## 2022-03-18 VITALS — BP 128/80 | HR 77 | Ht 76.0 in | Wt 240.0 lb

## 2022-03-18 DIAGNOSIS — E1142 Type 2 diabetes mellitus with diabetic polyneuropathy: Secondary | ICD-10-CM

## 2022-03-18 DIAGNOSIS — G6289 Other specified polyneuropathies: Secondary | ICD-10-CM

## 2022-03-18 LAB — COMPREHENSIVE METABOLIC PANEL
ALT: 17 U/L (ref 0–53)
AST: 21 U/L (ref 0–37)
Albumin: 4.6 g/dL (ref 3.5–5.2)
Alkaline Phosphatase: 36 U/L — ABNORMAL LOW (ref 39–117)
BUN: 15 mg/dL (ref 6–23)
CO2: 30 mEq/L (ref 19–32)
Calcium: 10.1 mg/dL (ref 8.4–10.5)
Chloride: 100 mEq/L (ref 96–112)
Creatinine, Ser: 0.85 mg/dL (ref 0.40–1.50)
GFR: 104.98 mL/min (ref >60.00)
Glucose, Bld: 111 mg/dL — ABNORMAL HIGH (ref 70–99)
Potassium: 3.9 mEq/L (ref 3.5–5.1)
Sodium: 137 mEq/L (ref 135–145)
Total Bilirubin: 1.3 mg/dL — ABNORMAL HIGH (ref 0.2–1.2)
Total Protein: 7.4 g/dL (ref 6.0–8.3)

## 2022-03-18 LAB — LIPID PANEL
Cholesterol: 213 mg/dL — ABNORMAL HIGH (ref 0–200)
HDL: 51.3 mg/dL (ref >39.00)
NonHDL: 161.63
Total CHOL/HDL Ratio: 4
Triglycerides: 280 mg/dL — ABNORMAL HIGH (ref 0.0–149.0)
VLDL: 56 mg/dL — ABNORMAL HIGH (ref 0.0–40.0)

## 2022-03-18 LAB — POCT GLYCOSYLATED HEMOGLOBIN (HGB A1C): Hemoglobin A1C: 5.3 % (ref 4.0–5.6)

## 2022-03-18 LAB — POCT GLUCOSE (DEVICE FOR HOME USE): Glucose Fasting, POC: 139 mg/dL — AB (ref 70–99)

## 2022-03-18 LAB — MICROALBUMIN / CREATININE URINE RATIO
Creatinine,U: 83.5 mg/dL
Microalb Creat Ratio: 0.8 mg/g (ref 0.0–30.0)
Microalb, Ur: <0.7 mg/dL (ref 0.0–1.9)

## 2022-03-18 LAB — LDL CHOLESTEROL, DIRECT: Direct LDL: 130 mg/dL

## 2022-03-18 MED ORDER — OZEMPIC (0.25 OR 0.5 MG/DOSE) 2 MG/3ML ~~LOC~~ SOPN
PEN_INJECTOR | SUBCUTANEOUS | 4 refills | Status: DC
  Filled 2022-03-18 – 2022-03-25 (×3): qty 3, 28d supply, fill #0

## 2022-03-18 NOTE — Progress Notes (Unsigned)
Patient ID: Tanner Frye, male   DOB: [birth year], 46 y.o.   MRN: [id number]   HPI: Tanner Frye is a 46 y.o.-year-old male, initially referred by his PCP, Tanner Frye *, returning for follow-up for DM2, dx in 2021, Diet controlled, uncontrolled, with complications (peripheral neuropathy).  At last visit, he mentioned that he was mostly here because of his neuropathy.  Interim hx: He continues to have neuropathy discomfort in his feet and occasional pain.  Alpha-lipoic acid twice a day did not help significantly. Also, L-me-folate did not help.  He feels exercising helps and his symptoms are increased if he takes a break from exercising for more than 2 to 3 days.  Since our last visit, he saw neurology on 12/17/2021.  It was considered that his neuropathy is related to diabetes.  Since he only had numbness at that time, no treatment recommendation was made since none of the available medications help in the absence of pain. Before last visit, he contacted me requesting Ozempic.  We started this at last visit.  He tolerates it well.  He lost 20 lbs. No increased urination, blurry vision, nausea, chest pain.  Reviewed HbA1c: Lab Results  Component Value Date   HGBA1C 5.8 (A) 08/26/2021   HGBA1C 5.5 02/19/2021   HGBA1C 6.2 (A) 11/20/2020   HGBA1C 6.0 (A) 08/01/2020   HGBA1C 8.9 (H) 01/26/2020   HGBA1C 6.8 (H) 03/29/2019   HGBA1C 6.2 (H) 03/25/2018   He is on: - Ozempic 0.5 mg weekly - started 08/2021  Pt checks his sugars 0-1x a day: - am: 100-120 >> 116-126 >> 115-120 >> 110s - 2h after b'fast: n/c >> 130 >> n/c >> 139 today - before lunch: n/c >> 123 >> 90s >> n/c - 2h after lunch: n/c >> 101 >> n/c - before dinner: 88-107, 134 >> 90s >> 90-100 - 2h after dinner: n/c >> 105 >> 115-120 >> n/c - bedtime: n/c >> 96 >> n/c - nighttime: n/c Lowest sugar was 80 >> 88 (after workout) >> 80s >> 90; it is unclear if he has hypoglycemia awareness.  Highest sugar was 180 >> 134 >> 120s  >> 139.  Glucometer: Freestyle lite  Pt's meals are: - Breakfast: 2 sugar-free jello cups + 2 eggs + 2 pieces of bacon >> oatmeal - Lunch: prev. Fast food >> now home cooked: chicken + veggies - Dinner: same - Snacks: no desserts, apples, carrots, crackers. No sweet drinks, but water, unsweet tea. Used to drink OJ. He [facility] in the past -he felt that this was helpful.  - no CKD, last BUN/creatinine:  Lab Results  Component Value Date   BUN 12 11/20/2020   BUN 12 09/09/2020   CREATININE 0.92 11/20/2020   CREATININE 0.86 09/09/2020  On losartan 100.  -+ HL; last set of lipids: Lab Results  Component Value Date   CHOL 223 (H) 03/29/2019   HDL 49 03/29/2019   LDLCALC 141 (H) 03/29/2019   TRIG 189 (H) 03/29/2019   CHOLHDL 4.6 03/29/2019  He is not on a statin. Of note, he had a coronary calcium score of 0.  - last eye exam was in 08/2021. No DR reportedly.   - + numbness and tingling in his feet. No pain. Started mid 2021.  Last foot exam per neurology 12/17/2021.  Pt has FH of prediabetes in M.  He also has a history of transaminitis (increased ALT) and also HTN.  At last visit, we investigated his peripheral  neuropathy-B6 vitamin was high, but normalized after stopping multivitamins: Office Visit on 02/19/2021  Component Date Value Ref Range Status   Vitamin B6 02/19/2021 15.0  2.1 - 21.7 ng/mL Final   Office Visit on 11/20/2020  Component Date Value Ref Range Status   TSH 11/20/2020 1.98  0.35 - 4.50 uIU/mL Final   Vitamin B-12 11/20/2020 537  211 - 911 pg/mL Final   Vitamin B6 11/20/2020 42.0* 2.1 - 21.7 ng/mL Final   Ferritin 11/20/2020 49.9  22.0 - 322.0 ng/mL Final   We also started ALA 600 mg 2x a day, but this did not help.  ROS: + see HPI Neurological: no tremors, + numbness and tingling/no dizziness/no HAs  I reviewed pt's medications, allergies, PMH, social hx, family hx, and changes were documented in the history of present illness.  Otherwise, unchanged from my initial visit note.  Past Medical History:  Diagnosis Date   Allergy    Anal fissure    Diabetes mellitus without complication ([place]) 7672   Family history of colorectal cancer    GERD (gastroesophageal reflux disease)    Hypertension 2021   Personal history of colonic polyps    Type 2 diabetes, controlled, with peripheral neuropathy ([place])    Past Surgical History:  Procedure Laterality Date   COLONOSCOPY WITH PROPOFOL N/A 04/27/2017   Procedure: COLONOSCOPY WITH PROPOFOL;  Surgeon: Jonathon Bellows, MD;  Location[facility] ENDOSCOPY;  Service: Endoscopy;  Laterality: N/A;   COLONOSCOPY WITH PROPOFOL N/A 06/21/2020   Procedure: COLONOSCOPY WITH PROPOFOL;  Surgeon: Jonathon Bellows, MD;  Location: [facility] ENDOSCOPY;  Service: Gastroenterology;  Laterality: N/A;   HERNIA REPAIR  1980   Social History   Socioeconomic History   Marital status: Married    Spouse name: Not on file   Number of children: Not on file   Years of education: Vocational Degree   Highest education level: Associate degree: occupational, Hotel manager, or vocational program  Occupational History   Occupation: Armed forces operational officer  Tobacco Use   Smoking status: Never   Smokeless tobacco: Never  Vaping Use   Vaping Use: Never used  Substance and Sexual Activity   Alcohol use: Yes    Alcohol/week: 0.0 standard drinks    Comment: Wine or beer 1 -2  once or twice a week.   Drug use: No   Sexual activity: Yes    Partners: Female    Birth control/protection: Surgical  Other Topics Concern   Not on file  Social History Narrative   Right Handed    Lives in a two story home   Social Determinants of Health   Financial Resource Strain: Not on file  Food Insecurity: Not on file  Transportation Needs: Not on file  Physical Activity: Not on file  Stress: Not on file  Social Connections: Not on file  Intimate Partner Violence: Not on file   Current Outpatient Medications on File Prior to Visit  Medication  Sig Dispense Refill   Ascorbic Acid (VITAMIN C) 1000 MG tablet Take 1,000 mg by mouth daily.     benzonatate (TESSALON) 100 MG capsule Take 1 capsule (100 mg total) by mouth 3 (three) times daily as needed. (Patient not taking: Reported on 12/17/2021) 30 capsule 0   Blood Glucose Monitoring Suppl (FREESTYLE LITE) DEVI Use to check blood sugar up to twice a day as advised. 1 each 0   EPIPEN 2-PAK 0.3 MG/0.3ML SOAJ injection INJ 0.3 ML IM ONCE FOR 1 DOSE  0   fluticasone (FLONASE) 50 MCG/ACT  nasal spray Place 2 sprays into both nostrils daily. (Patient not taking: Reported on 12/17/2021) 16 g 6   FREESTYLE LITE test strip Use to check blood sugar up to twice a day as advised. 200 each 3   hydrochlorothiazide (HYDRODIURIL) 25 MG tablet TAKE 1 TABLET BY MOUTH ONCE DAILY WITH LOSARTAN 90 tablet 2   Lancets (FREESTYLE) lancets Use to check blood sugar up to twice a day as advised. 100 each 12   Loratadine 10 MG CAPS      losartan (COZAAR) 100 MG tablet TAKE 1 TABLET BY MOUTH DAILY ALONG WITH HYDROCHLOROTHIAZIDE. 90 tablet 2   omeprazole (PRILOSEC) 20 MG capsule Take 10 mg by mouth daily.     Semaglutide,0.25 or 0.'5MG'$ /DOS, (OZEMPIC, 0.25 OR 0.5 MG/DOSE,) 2 MG/1.5ML SOPN Inject 0.5 mg into the skin once a week. 4.5 mL 3   No current facility-administered medications on file prior to visit.   Allergies  Allergen Reactions   Bee Venom    Oatmeal Swelling   Other Swelling    Processed foods that are baked   Prednisone Other (See Comments)    Other reaction(s): Other (See Comments) Effects mood verbal per patient Other reaction(s): Other (See Comments) Effects mood verbal per patient Effects mood verbal per patient Other reaction(s): Other (See Comments) Effects mood verbal per patient Effects mood verbal per patient   Family History  Problem Relation Age of Onset   Hypertension Mother    Hyperlipidemia Mother    Rectal cancer Brother 6   Cancer Brother    Rectal cancer Paternal Aunt    Colon  cancer Paternal Uncle    Prostate cancer Neg Hx    PE: BP 128/80 (BP Location: Left Arm, Patient Position: Sitting, Cuff Size: Small)   Pulse 77   Ht '6\' 4"'$  (1.93 m)   Wt 240 lb (108.9 kg)   SpO2 94%   BMI 29.21 kg/m  Wt Readings from Last 3 Encounters:  03/18/22 240 lb (108.9 kg)  12/17/21 242 lb (109.8 kg)  08/26/21 260 lb 3.2 oz (118 kg)   Constitutional: overweight, in NAD Eyes: PERRLA, EOMI, no exophthalmos ENT: moist mucous membranes, no thyromegaly, no cervical lymphadenopathy Cardiovascular: RRR, No MRG Respiratory: CTA B Musculoskeletal: no deformities Skin: moist, warm, no rashes Neurological: no tremor with outstretched hands  ASSESSMENT: 1. DM2, non-insulin-dependent, Diet controlled, uncontrolled, with complications - PN  2. Peripheral neuropathy  PLAN:  1. Patient with relatively recent diabetes diagnosis in 2021, after a period of time with prediabetes.  In 01/2020, HbA1c returned high, at 8.9%, but afterwards, after he changed his diet, sugars improved significantly without the need for medications.  HbA1c at last visit was excellent, at 5.8%, slightly higher.  We discussed about improving diet, especially reducing snacks or at least replacing them with healthier snacks.  I advised him to reduce fatty foods especially with breakfast at previous visits and he did eliminate eggs and switched to oatmeal before last visit.  He lost 15 pounds.  However, he was inquiring about Ozempic at last visit and, after discussion about benefits and possible side effects, we decided to give it a try. He lost 20 lbs after starting it! -At this visit, sugars remain very well controlled.  He is not taking them consistently and we discussed about taking them at least every other day to catch trends.  Otherwise, we can continue with Ozempic for now.  He tells me that he would want to continue for another 6 months and then  stop. -At this visit, he inquires about the persistent need for  Cozaar.  We discussed that, in the setting of hypertension, this is a very good option since it also offers kidney protection.  He does inquire about whether this is still needed if his blood pressure comes down after losing weight.  In the absence of microalbuminuria, we discussed that he could probably come off at that time. - I suggested to:  Patient Instructions  Please continue: - Ozempic 0.5 mg weekly in a.m.  Please come back for a follow-up appointment in 6 months.  - we checked his HbA1c: 5.3% (lower) - advised to check sugars at different times of the day - 1x a day, rotating check times - advised for yearly eye exams >> he is UTD - will check annual labs today - return to clinic in 6 months  2. Peripheral neuropathy -Developed around the time when his diabetes became uncontrolled but his diabetes control improved afterwards with persistent neuropathy symptoms.  At last visit he described no sensation in the front part of his sole and increase sensitivity mid foot. -Symptoms improved with exercising  -Usually he has numbness, but without discomfort -He tried gabapentin, alpha lipoid acid, L-methylfolate without any improvement in symptoms. -B6 level was high so we stopped his multivitamins but then normalized B6 level afterwards did not help with symptoms -B12 vitamin and ferritin levels were normal -No history of excess alcohol or toxic exposures. -At last visit I referred him to neurology.  He saw Dr. Posey Pronto on 12/17/2021.  His neuropathy was still deemed due to his diabetes despite the mild nature of this.  Since he only has numbness, only conservative measures were recommended. -Of note, his brother also developed neuropathy, but after chemotherapy  Philemon Kingdom, MD PhD [facility] Endocrinology

## 2022-03-18 NOTE — Patient Instructions (Signed)
Please continue: - Ozempic 0.5 mg weekly in a.m.  Please come back for a follow-up appointment in 6 months.

## 2022-03-19 ENCOUNTER — Encounter: Payer: Self-pay | Admitting: Internal Medicine

## 2022-03-20 ENCOUNTER — Other Ambulatory Visit: Payer: Self-pay

## 2022-03-23 ENCOUNTER — Other Ambulatory Visit: Payer: Self-pay

## 2022-03-24 ENCOUNTER — Other Ambulatory Visit (HOSPITAL_COMMUNITY): Payer: Self-pay

## 2022-03-24 ENCOUNTER — Telehealth: Payer: Self-pay

## 2022-03-24 DIAGNOSIS — E1142 Type 2 diabetes mellitus with diabetic polyneuropathy: Secondary | ICD-10-CM

## 2022-03-24 NOTE — Telephone Encounter (Signed)
Patient Advocate Encounter   Received notification that prior authorization for Ozempic (0.25 or 0.5 MG/DOSE) '2MG'$ /3ML pen-injectors is required.   PA submitted on 03/24/2022 Key [id number] Status is pending

## 2022-03-25 NOTE — Telephone Encounter (Signed)
Received notification that the request for prior authorization for Ozempic (0.25 or 0.5 MG/DOSE) '2MG'$ /3ML pen-injectors  has been denied due to this medicine only being covered if: You have a history of suboptimal response (after a three-month trial), contraindication or intolerance to metformin (generic Glucophage, Glucophage XR).

## 2022-03-26 ENCOUNTER — Other Ambulatory Visit: Payer: Self-pay

## 2022-03-27 ENCOUNTER — Ambulatory Visit: Payer: 59 | Admitting: Family Medicine

## 2022-03-27 ENCOUNTER — Encounter: Payer: Self-pay | Admitting: Family Medicine

## 2022-03-27 ENCOUNTER — Other Ambulatory Visit: Payer: Self-pay

## 2022-03-27 VITALS — BP 128/72 | HR 64 | Ht 76.0 in | Wt 240.2 lb

## 2022-03-27 DIAGNOSIS — Z0000 Encounter for general adult medical examination without abnormal findings: Secondary | ICD-10-CM

## 2022-03-27 DIAGNOSIS — E785 Hyperlipidemia, unspecified: Secondary | ICD-10-CM

## 2022-03-27 DIAGNOSIS — E1169 Type 2 diabetes mellitus with other specified complication: Secondary | ICD-10-CM

## 2022-03-27 DIAGNOSIS — Z125 Encounter for screening for malignant neoplasm of prostate: Secondary | ICD-10-CM

## 2022-03-27 DIAGNOSIS — I10 Essential (primary) hypertension: Secondary | ICD-10-CM | POA: Diagnosis not present

## 2022-03-27 DIAGNOSIS — Z91030 Bee allergy status: Secondary | ICD-10-CM | POA: Diagnosis not present

## 2022-03-27 MED ORDER — EPINEPHRINE 0.3 MG/0.3ML IJ SOAJ: 0.3000 mg | INTRAMUSCULAR | 1 refills | Status: AC | PRN

## 2022-03-27 MED ORDER — METFORMIN HCL 500 MG PO TABS: 500.0000 mg | ORAL_TABLET | Freq: Two times a day (BID) | ORAL | 5 refills | Status: DC

## 2022-03-27 NOTE — Telephone Encounter (Signed)
Pt contacted and advised of denial and alternative medication sent to preferred pharmacy. Pt will follow up as needed.

## 2022-03-27 NOTE — Telephone Encounter (Signed)
T, we will have to try metformin. Please send a Rx for 500 mg 2x a day with meals (#60 tabs with 11 refills), but start with only 1 tablet with dinner, and then increase to twice a day after 4 days.  Please let us know if she is not tolerating it (nausea, diarrhea), in which case, we could go back to Ozempic.

## 2022-03-27 NOTE — Patient Instructions (Addendum)
Thank you for coming to the office today.  Okay to remain off HCTZ and Losartan  Goal for blood pressure < 135 / 85  Keep an eye on protein in urine when rechecked in future.  Let me know if interested in checking other issues such as possible Testosterone, would need to be an AM only lab.  Please schedule a Follow-up Appointment to: Return in about 1 year (around 03/28/2023) for 1 year Annual Physical can do AM fasting lab AFTER if need.  If you have any other questions or concerns, please feel free to call the office or send a message through [place]. You may also schedule an earlier appointment if necessary.  Additionally, you may be receiving a survey about your experience at our office within a few days to 1 week by e-mail or mail. We value your feedback.  Nobie Putnam, DO [place]

## 2022-03-27 NOTE — Assessment & Plan Note (Signed)
Resolved HTN BP in normal range, controlled on lifestyle w/ weight loss Remain OFF HCTZ 25 and Losartan 100 Endocrine checking urine microalbumin last normal negative 02/2022, check 6-12 months

## 2022-03-27 NOTE — Assessment & Plan Note (Signed)
Improved I4P Complications - peripheral neuropathy, hyperlipidemia, GERD, increases risk of future cardiovascular complications   Managed by Endocrine  Plan:  1. Ozempic and Metformin 2. Encourage improved lifestyle - low carb, low sugar diet, reduce portion size, continue improving regular exercise 3. Check CBG , bring log to next visit for review 4. Continue ARB, - future Statin

## 2022-03-27 NOTE — Progress Notes (Signed)
Subjective:    Patient ID: Tanner Frye, male    DOB: August 13, 1976, 46 y.o.   MRN: [id number]  Tanner Frye is a 46 y.o. male presenting on 03/27/2022 for Annual Exam   HPI  Here for Annual Physical and lab Review.  CHRONIC DM, Type 2 BMI >29  Followed by Dr Bennett [facility] A1c down to 5.3 Weight is down Last lab 03/18/22 visit with them, showed negative protein or normal urine microalbumin Improved overall with diet control. He has been on Ozempic for 6 months with assistance for weight loss  CBGs: avg well controlled Meds: Ozempic 0.'5mg'$  weekly Now OFF ARB Last urine microalbumin negative Lifestyle: - Diet (improving low carb low sugar)  Denies hypoglycemia, polyuria, visual changes, numbness or tingling.   HYPERLIPIDEMIA / Elevated LFTs. Normalized LFTs. Improved lipids.   HTN His goal is to come off his BP medication. He is remained off med HCTZ '25mg'$  for several months and now recently has come off Losartan for about 2 weeks, he is seeing home BP readings 120 / 70-80s, asking about coming off medication.   Health Maintenance: Request copy of DM Eye Exam  Upcoming Colonoscopy July 2023.     03/27/2022    9:09 AM 08/05/2020    3:34 PM 02/13/2020    9:11 AM  Depression screen PHQ 2/9  Decreased Interest 0 0 0  Down, Depressed, Hopeless 0 0 0  PHQ - 2 Score 0 0 0  Altered sleeping 0    Tired, decreased energy 0    Change in appetite 0    Feeling bad or failure about yourself  0    Trouble concentrating 0    Moving slowly or fidgety/restless 0    Suicidal thoughts 0    PHQ-9 Score 0    Difficult doing work/chores Not difficult at all      Past Medical History:  Diagnosis Date   Allergy    Anal fissure    Diabetes mellitus without complication ([place]) 6644   Family history of colorectal cancer    GERD (gastroesophageal reflux disease)    Hypertension 2021   Personal history of colonic polyps    Type 2 diabetes, controlled, with peripheral  neuropathy ([place])    Past Surgical History:  Procedure Laterality Date   COLONOSCOPY WITH PROPOFOL N/A 04/27/2017   Procedure: COLONOSCOPY WITH PROPOFOL;  Surgeon: Jonathon Bellows, MD;  Location[facility] END[facility]COPY;  Service: Endoscopy;  Laterality: N/A;   COLONOSCOPY WITH PROPOFOL N/A 06/21/2020   Procedure: COLONOSCOPY WITH PROPOFOL;  Surgeon: Jonathon Bellows, MD;  Location: [facility] ENDOSCOPY;  Service: Gastroenterology;  Laterality: N/A;   HERNIA REPAIR  1980   Social History   Socioeconomic History   Marital status: Married    Spouse name: Not on file   Number of children: Not on file   Years of education: Vocational Degree   Highest education level: Associate degree: occupational, Hotel manager, or vocational program  Occupational History   Occupation: Armed forces operational officer  Tobacco Use   Smoking status: Never   Smokeless tobacco: Never  Vaping Use   Vaping Use: Never used  Substance and Sexual Activity   Alcohol use: Yes    Alcohol/week: 0.0 standard drinks of alcohol    Comment: Wine or beer 1 -2  once or twice a week.   Drug use: No   Sexual activity: Yes    Partners: Female    Birth control/protection: Surgical  Other Topics Concern   Not on file  Social  History Narrative   Right Handed    Lives in a two story home   Social Determinants of Health   Financial Resource Strain: Not on file  Food Insecurity: Not on file  Transportation Needs: Not on file  Physical Activity: Not on file  Stress: Not on file  Social Connections: Not on file  Intimate Partner Violence: Not on file   Family History  Problem Relation Age of Onset   Hypertension Mother    Hyperlipidemia Mother    Rectal cancer Brother 37   Cancer Brother    Rectal cancer Paternal Aunt    Colon cancer Paternal Uncle    Prostate cancer Neg Hx    Current Outpatient Medications on File Prior to Visit  Medication Sig   Ascorbic Acid (VITAMIN C) 1000 MG tablet Take 1,000 mg by mouth daily.   Blood Glucose Monitoring Suppl  (FREESTYLE LITE) DEVI Use to check blood sugar up to twice a day as advised.   fluticasone (FLONASE) 50 MCG/ACT nasal spray Place 2 sprays into both nostrils daily.   FREESTYLE LITE test strip Use to check blood sugar up to twice a day as advised.   Lancets (FREESTYLE) lancets Use to check blood sugar up to twice a day as advised.   Loratadine 10 MG CAPS    omeprazole (PRILOSEC) 20 MG capsule Take 10 mg by mouth daily.   Semaglutide,0.25 or 0.'5MG'$ /DOS, (OZEMPIC, 0.25 OR 0.5 MG/DOSE,) 2 MG/3ML SOPN Inject 0.'5mg'$  under the skin once a week   metFORMIN (GLUCOPHAGE) 500 MG tablet Take 1 tablet (500 mg total) by mouth 2 (two) times daily with a meal.   No current facility-administered medications on file prior to visit.    Review of Systems Per HPI unless specifically indicated above      Objective:    BP 128/72   Pulse 64   Ht '6\' 4"'$  (1.93 m)   Wt 240 lb 3.2 oz (109 kg)   SpO2 99%   BMI 29.24 kg/m   Wt Readings from Last 3 Encounters:  03/27/22 240 lb 3.2 oz (109 kg)  03/18/22 240 lb (108.9 kg)  12/17/21 242 lb (109.8 kg)    Physical Exam    Results for orders placed or performed in visit on 03/18/22  Microalbumin / creatinine urine ratio  Result Value Ref Range   Microalb, Ur <0.7 0.0 - 1.9 mg/dL   Creatinine,U 83.5 mg/dL   Microalb Creat Ratio 0.8 0.0 - 30.0 mg/g  Comprehensive metabolic panel  Result Value Ref Range   Sodium 137 135 - 145 mEq/L   Potassium 3.9 3.5 - 5.1 mEq/L   Chloride 100 96 - 112 mEq/L   CO2 30 19 - 32 mEq/L   Glucose, Bld 111 (H) 70 - 99 mg/dL   BUN 15 6 - 23 mg/dL   Creatinine, Ser 0.85 0.40 - 1.50 mg/dL   Total Bilirubin 1.3 (H) 0.2 - 1.2 mg/dL   Alkaline Phosphatase 36 (L) 39 - 117 U/L   AST 21 0 - 37 U/L   ALT 17 0 - 53 U/L   Total Protein 7.4 6.0 - 8.3 g/dL   Albumin 4.6 3.5 - 5.2 g/dL   GFR 104.98 >60.00 mL/min   Calcium 10.1 8.4 - 10.5 mg/dL  Lipid panel  Result Value Ref Range   Cholesterol 213 (H) 0 - 200 mg/dL   Triglycerides  280.0 (H) 0.0 - 149.0 mg/dL   HDL 51.30 >39.00 mg/dL   VLDL 56.0 (H) 0.0 - 40.0  mg/dL   Total CHOL/HDL Ratio 4    NonHDL 161.63   LDL cholesterol, direct  Result Value Ref Range   Direct LDL 130.0 mg/dL  POCT glycosylated hemoglobin (Hb A1C)  Result Value Ref Range   Hemoglobin A1C 5.3 4.0 - 5.6 %   HbA1c POC (<> result, manual entry)     HbA1c, POC (prediabetic range)     HbA1c, POC (controlled diabetic range)    POCT Glucose (Device for Home Use)  Result Value Ref Range   Glucose Fasting, POC 139 (A) 70 - 99 mg/dL   POC Glucose        Assessment & Plan:   Problem List Items Addressed This Visit     Type 2 diabetes mellitus with other specified complication (HCC)    Improved I6N Complications - peripheral neuropathy, hyperlipidemia, GERD, increases risk of future cardiovascular complications   Managed by Endocrine  Plan:  1. Ozempic and Metformin 2. Encourage improved lifestyle - low carb, low sugar diet, reduce portion size, continue improving regular exercise 3. Check CBG , bring log to next visit for review 4. Continue ARB, - future Statin      Hyperlipidemia associated with type 2 diabetes mellitus (HCC)   Relevant Medications   EPINEPHrine (EPIPEN 2-PAK) 0.3 mg/0.3 mL IJ SOAJ injection   Essential hypertension    Resolved HTN BP in normal range, controlled on lifestyle w/ weight loss Remain OFF HCTZ 25 and Losartan 100 Endocrine checking urine microalbumin last normal negative 02/2022, check 6-12 months      Relevant Medications   EPINEPHrine (EPIPEN 2-PAK) 0.3 mg/0.3 mL IJ SOAJ injection   Other Relevant Orders   CBC with Differential/Platelet   Other Visit Diagnoses     Annual physical exam    -  Primary   Relevant Orders   PSA   CBC with Differential/Platelet   Allergy to honey bee venom       Relevant Medications   EPINEPHrine (EPIPEN 2-PAK) 0.3 mg/0.3 mL IJ SOAJ injection   Screening PSA (prostate specific antigen)       Relevant Orders   PSA        Updated Health Maintenance information Reviewed recent lab results with patient Encouraged improvement to lifestyle with diet and exercise Goal of weight loss  Let me know if interested in checking other issues such as possible Testosterone, would need to be an AM only lab.   Meds ordered this encounter  Medications   EPINEPHrine (EPIPEN 2-PAK) 0.3 mg/0.3 mL IJ SOAJ injection    Sig: Inject 0.3 mg into the muscle as needed for anaphylaxis.    Dispense:  1 each    Refill:  1     Follow up plan: Return in about 1 year (around 03/28/2023) for 1 year Annual Physical can do AM fasting lab AFTER if need.  Nobie Putnam, [facility] 03/27/2022, 9:13 AM

## 2022-03-27 NOTE — Addendum Note (Signed)
Addended by: Lauralyn Primes on: 03/27/2022 03:44 PM   Modules accepted: Orders

## 2022-03-28 LAB — CBC WITH DIFFERENTIAL/PLATELET
Absolute Monocytes: 660 cells/uL (ref 200–950)
Basophils Absolute: 38 cells/uL (ref 0–200)
Basophils Relative: 0.5 %
Eosinophils Absolute: 120 cells/uL (ref 15–500)
Eosinophils Relative: 1.6 %
HCT: 45.8 % (ref 38.5–50.0)
Hemoglobin: 15.2 g/dL (ref 13.2–17.1)
Lymphs Abs: 2753 cells/uL (ref 850–3900)
MCH: 29.1 pg (ref 27.0–33.0)
MCHC: 33.2 g/dL (ref 32.0–36.0)
MCV: 87.6 fL (ref 80.0–100.0)
MPV: 9.3 fL (ref 7.5–12.5)
Monocytes Relative: 8.8 %
Neutro Abs: 3930 cells/uL (ref 1500–7800)
Neutrophils Relative %: 52.4 %
Platelets: 289 10*3/uL (ref 140–400)
RBC: 5.23 10*6/uL (ref 4.20–5.80)
RDW: 12.6 % (ref 11.0–15.0)
Total Lymphocyte: 36.7 %
WBC: 7.5 10*3/uL (ref 3.8–10.8)

## 2022-03-28 LAB — PSA: PSA: 0.57 ng/mL (ref <=4.00)

## 2022-03-30 ENCOUNTER — Encounter: Payer: Self-pay | Admitting: Family Medicine

## 2022-03-30 DIAGNOSIS — E1142 Type 2 diabetes mellitus with diabetic polyneuropathy: Secondary | ICD-10-CM

## 2022-03-30 MED ORDER — GABAPENTIN 100 MG PO CAPS: 300.0000 mg | ORAL_CAPSULE | Freq: Every day | ORAL | 1 refills | Status: DC

## 2022-03-30 NOTE — Addendum Note (Signed)
Addended by: Olin Hauser on: 03/30/2022 04:50 PM   Modules accepted: Orders

## 2022-04-07 ENCOUNTER — Encounter: Payer: Self-pay | Admitting: Internal Medicine

## 2022-04-10 ENCOUNTER — Telehealth: Payer: Self-pay

## 2022-04-10 ENCOUNTER — Other Ambulatory Visit: Payer: Self-pay

## 2022-04-10 DIAGNOSIS — E1142 Type 2 diabetes mellitus with diabetic polyneuropathy: Secondary | ICD-10-CM

## 2022-04-10 MED ORDER — OZEMPIC (0.25 OR 0.5 MG/DOSE) 2 MG/3ML ~~LOC~~ SOPN: PEN_INJECTOR | SUBCUTANEOUS | 4 refills | Status: DC

## 2022-04-13 ENCOUNTER — Other Ambulatory Visit (HOSPITAL_COMMUNITY): Payer: Self-pay

## 2022-04-17 ENCOUNTER — Other Ambulatory Visit: Payer: Self-pay

## 2022-04-24 ENCOUNTER — Ambulatory Visit: Payer: 59 | Admitting: Anesthesiology

## 2022-04-24 ENCOUNTER — Encounter: Payer: Self-pay | Admitting: Gastroenterology

## 2022-04-24 ENCOUNTER — Encounter
Admission: RE | Disposition: A | Discharge status: Home / Self Care | Payer: Self-pay | Source: Home / Self Care | Attending: Gastroenterology

## 2022-04-24 ENCOUNTER — Ambulatory Visit
Admission: RE | Admit: 2022-04-24 | Discharge: 2022-04-24 | Disposition: A | Discharge status: Home / Self Care | Payer: 59 | Attending: Gastroenterology | Admitting: Gastroenterology

## 2022-04-24 DIAGNOSIS — Z7984 Long term (current) use of oral hypoglycemic drugs: Secondary | ICD-10-CM | POA: Diagnosis not present

## 2022-04-24 DIAGNOSIS — E1142 Type 2 diabetes mellitus with diabetic polyneuropathy: Secondary | ICD-10-CM | POA: Insufficient documentation

## 2022-04-24 DIAGNOSIS — K219 Gastro-esophageal reflux disease without esophagitis: Secondary | ICD-10-CM | POA: Insufficient documentation

## 2022-04-24 DIAGNOSIS — I10 Essential (primary) hypertension: Secondary | ICD-10-CM | POA: Diagnosis not present

## 2022-04-24 DIAGNOSIS — Z09 Encounter for follow-up examination after completed treatment for conditions other than malignant neoplasm: Secondary | ICD-10-CM | POA: Insufficient documentation

## 2022-04-24 DIAGNOSIS — Z800 Family history of malignant neoplasm of digestive organs: Secondary | ICD-10-CM

## 2022-04-24 DIAGNOSIS — Z7985 Long-term (current) use of injectable non-insulin antidiabetic drugs: Secondary | ICD-10-CM | POA: Diagnosis not present

## 2022-04-24 DIAGNOSIS — Z86010 Personal history of colonic polyps: Secondary | ICD-10-CM | POA: Diagnosis not present

## 2022-04-24 DIAGNOSIS — Z79899 Other long term (current) drug therapy: Secondary | ICD-10-CM | POA: Insufficient documentation

## 2022-04-24 HISTORY — PX: COLONOSCOPY WITH PROPOFOL: SHX5780

## 2022-04-24 LAB — GLUCOSE, CAPILLARY: Glucose-Capillary: 98 mg/dL (ref 70–99)

## 2022-04-24 SURGERY — COLONOSCOPY WITH PROPOFOL
Anesthesia: General

## 2022-04-24 MED ORDER — SODIUM CHLORIDE 0.9 % IV SOLN: INTRAVENOUS | Status: DC

## 2022-04-24 MED ORDER — PROPOFOL 10 MG/ML IV BOLUS
INTRAVENOUS | Status: DC | PRN
  Administered 2022-04-24: 30 mg via INTRAVENOUS
  Administered 2022-04-24: 100 mg via INTRAVENOUS

## 2022-04-24 MED ORDER — PROPOFOL 500 MG/50ML IV EMUL
INTRAVENOUS | Status: DC | PRN
  Administered 2022-04-24: 180 ug/kg/min via INTRAVENOUS

## 2022-04-24 MED ORDER — PROPOFOL 1000 MG/100ML IV EMUL
INTRAVENOUS | Status: AC
  Filled 2022-04-24: qty 100

## 2022-04-24 NOTE — H&P (Signed)
Tanner Bellows, MD [address], [place], [place], [place], [address] [address], [place], [place], [place], [address] Phone: [phone]  Fax: [phone]  Primary Care Physician:  Olin Hauser, DO   Pre-Procedure History & Physical: HPI:  Tanner Frye is a 46 y.o. male is here for an colonoscopy.   Past Medical History:  Diagnosis Date   Allergy    Anal fissure    Diabetes mellitus without complication ([place]) 7517   Family history of colorectal cancer    GERD (gastroesophageal reflux disease)    Hypertension 2021   Personal history of colonic polyps    Type 2 diabetes, controlled, with peripheral neuropathy ([place])     Past Surgical History:  Procedure Laterality Date   COLONOSCOPY WITH PROPOFOL N/A 04/27/2017   Procedure: COLONOSCOPY WITH PROPOFOL;  Surgeon: Tanner Bellows, MD;  Location: [facility] ENDOSCOPY;  Service: Endoscopy;  Laterality: N/A;   COLONOSCOPY WITH PROPOFOL N/A 06/21/2020   Procedure: COLONOSCOPY WITH PROPOFOL;  Surgeon: Tanner Bellows, MD;  Location: [facility] ENDOSCOPY;  Service: Gastroenterology;  Laterality: N/A;   HERNIA REPAIR  1980    Prior to Admission medications   Medication Sig Start Date End Date Taking? Authorizing Provider  Loratadine 10 MG CAPS  10/26/18  Yes [provider]  omeprazole (PRILOSEC) 20 MG capsule Take 10 mg by mouth daily.   Yes [provider]  Ascorbic Acid (VITAMIN C) 1000 MG tablet Take 1,000 mg by mouth daily.    [provider]  Blood Glucose Monitoring Suppl (FREESTYLE LITE) DEVI Use to check blood sugar up to twice a day as advised. 02/14/20   Karamalegos, Devonne Doughty, DO  EPINEPHrine (EPIPEN 2-PAK) 0.3 mg/0.3 mL IJ SOAJ injection Inject 0.3 mg into the muscle as needed for anaphylaxis. 03/27/22   Karamalegos, Devonne Doughty, DO  fluticasone (FLONASE) 50 MCG/ACT nasal spray Place 2 sprays into both nostrils daily. 10/04/21   Apolonio Schneiders, FNP  FREESTYLE LITE test strip Use to check blood sugar up to  twice a day as advised. 02/14/20   Karamalegos, Devonne Doughty, DO  gabapentin (NEURONTIN) 100 MG capsule Take 3 capsules (300 mg total) by mouth at bedtime. 03/30/22   Karamalegos, Devonne Doughty, DO  Lancets (FREESTYLE) lancets Use to check blood sugar up to twice a day as advised. 02/14/20   Karamalegos, Devonne Doughty, DO  metFORMIN (GLUCOPHAGE) 500 MG tablet Take 1 tablet (500 mg total) by mouth 2 (two) times daily with a meal. Patient not taking: Reported on 04/24/2022 03/27/22   Philemon Kingdom, MD  Semaglutide,0.25 or 0.'5MG'$ /DOS, (OZEMPIC, 0.25 OR 0.5 MG/DOSE,) 2 MG/3ML SOPN 0.25 mg weekly for 4 weeks then 0.5 mg 04/10/22   Philemon Kingdom, MD    Allergies as of 12/05/2021 - Review Complete 10/04/2021  Allergen Reaction Noted   Bee venom  01/16/2014   Oatmeal Swelling 02/13/2020   Other Swelling 02/13/2020   Prednisone Other (See Comments) 05/23/2013    Family History  Problem Relation Age of Onset   Hypertension Mother    Hyperlipidemia Mother    Rectal cancer Brother 62   Cancer Brother    Rectal cancer Paternal Aunt    Colon cancer Paternal Uncle    Prostate cancer Neg Hx     Social History   Socioeconomic History   Marital status: Married    Spouse name: Not on file   Number of children: Not on file   Years of education: Vocational Degree   Highest education level: Associate degree: occupational, Hotel manager,  or vocational program  Occupational History   Occupation: Armed forces operational officer  Tobacco Use   Smoking status: Never   Smokeless tobacco: Never  Vaping Use   Vaping Use: Never used  Substance and Sexual Activity   Alcohol use: Yes    Alcohol/week: 0.0 standard drinks of alcohol    Comment: Wine or beer 1 -2  once or twice a week.   Drug use: No   Sexual activity: Yes    Partners: Female    Birth control/protection: Surgical  Other Topics Concern   Not on file  Social History Narrative   Right Handed    Lives in a two story home   Social Determinants of Health    Financial Resource Strain: Not on file  Food Insecurity: Not on file  Transportation Needs: Not on file  Physical Activity: Not on file  Stress: Not on file  Social Connections: Not on file  Intimate Partner Violence: Not on file    Review of Systems: See HPI, otherwise negative ROS  Physical Exam: BP (!) 146/97   Pulse 62   Temp (!) 97 F (36.1 C) (Temporal)   Resp 16   Ht '6\' 4"'$  (1.93 m)   Wt 104.3 kg   SpO2 97%   BMI 28.00 kg/m  General:   Alert,  pleasant and cooperative in NAD Head:  Normocephalic and atraumatic. Neck:  Supple; no masses or thyromegaly. Lungs:  Clear throughout to auscultation, normal respiratory effort.    Heart:  +S1, +S2, Regular rate and rhythm, No edema. Abdomen:  Soft, nontender and nondistended. Normal bowel sounds, without guarding, and without rebound.   Neurologic:  Alert and  oriented x4;  grossly normal neurologically.  Impression/Plan: [name] is here for an colonoscopy to be performed for surveillance due to prior history of colon polyps   Risks, benefits, limitations, and alternatives regarding  colonoscopy have been reviewed with the patient.  Questions have been answered.  All parties agreeable.   Tanner Bellows, MD  04/24/2022, 7:43 AM

## 2022-04-24 NOTE — Transfer of Care (Signed)
Immediate Anesthesia Transfer of Care Note  Patient: Tanner Frye  Procedure(s) Performed: COLONOSCOPY WITH PROPOFOL  Patient Location: PACU and Endoscopy Unit  Anesthesia Type:General  Level of Consciousness: drowsy and patient cooperative  Airway & Oxygen Therapy: Patient Spontanous Breathing  Post-op Assessment: Report given to RN and Post -op Vital signs reviewed and stable  Post vital signs: Reviewed and stable  Last Vitals:  Vitals Value Taken Time  BP 107/78 04/24/22 0808  Temp 35.7 C 04/24/22 0808  Pulse 72 04/24/22 0809  Resp 13 04/24/22 0809  SpO2 97 % 04/24/22 0809  Vitals shown include unvalidated device data.  Last Pain:  Vitals:   04/24/22 0808  TempSrc: Temporal  PainSc: Asleep         Complications: No notable events documented.

## 2022-04-24 NOTE — Op Note (Signed)
[facility] Gastroenterology Patient Name: Tanner Frye Procedure Date: 04/24/2022 7:46 AM MRN: [id number] Account #: [id number] Date of Birth: 1976/06/23 Admit Type: Outpatient Age: 46 Room: [facility] ENDO ROOM 4 Gender: Male Note Status: Finalized Instrument Name: Jasper Riling [id number] Procedure:             Colonoscopy Indications:           High risk colon cancer surveillance: Personal history                         of multiple (3 or more) adenomas Providers:             Jonathon Bellows MD, MD Medicines:             Monitored Anesthesia Care Complications:         No immediate complications. Procedure:             Pre-Anesthesia Assessment:                        - Prior to the procedure, a History and Physical was                         performed, and patient medications, allergies and                         sensitivities were reviewed. The patient's tolerance                         of previous anesthesia was reviewed.                        - ASA Grade Assessment: II - A patient with mild                         systemic disease.                        After obtaining informed consent, the colonoscope was                         passed under direct vision. Throughout the procedure,                         the patient's blood pressure, pulse, and oxygen                         saturations were monitored continuously. The                         Colonoscope was introduced through the anus and                         advanced to the the cecum, identified by appendiceal                         orifice and ileocecal valve. The colonoscopy was                         performed with ease. The patient tolerated the  procedure well. The quality of the bowel preparation                         was good. Findings:      The perianal and digital rectal examinations were normal.      The entire examined colon appeared normal on direct and retroflexion        views. Impression:            - The entire examined colon is normal on direct and                         retroflexion views.                        - No specimens collected. Recommendation:        - Discharge patient to home (with escort).                        - Resume previous diet.                        - Continue present medications.                        - Repeat colonoscopy in 5 years for surveillance. Procedure Code(s):     --- Professional ---                        [id number], Colonoscopy, flexible; diagnostic, including                         collection of specimen(s) by brushing or washing, when                         performed (separate procedure) Diagnosis Code(s):     --- Professional ---                        Z86.010, Personal history of colonic polyps CPT copyright 2019 American Medical Association. All rights reserved. The codes documented in this report are preliminary and upon coder review may  be revised to meet current compliance requirements. Jonathon Bellows, MD Jonathon Bellows MD, MD 04/24/2022 8:06:44 AM This report has been signed electronically. Number of Addenda: 0 Note Initiated On: 04/24/2022 7:46 AM Scope Withdrawal Time: 0 hours 12 minutes 8 seconds  Total Procedure Duration: 0 hours 14 minutes 32 seconds  Estimated Blood Loss:  Estimated blood loss: none.      [facility]

## 2022-04-24 NOTE — Anesthesia Preprocedure Evaluation (Signed)
Anesthesia Evaluation  Patient identified by MRN, date of birth, ID band Patient awake    Reviewed: Allergy & Precautions, NPO status , Patient's Chart, lab work & pertinent test results  Airway Mallampati: II  TM Distance: >3 FB Neck ROM: Full    Dental  (+) Teeth Intact   Pulmonary neg pulmonary ROS,    breath sounds clear to auscultation       Cardiovascular Exercise Tolerance: Good hypertension, Pt. on medications  Rhythm:Regular Rate:Normal     Neuro/Psych negative neurological ROS     GI/Hepatic Neg liver ROS, GERD  Medicated,On Ozempic   Endo/Other  diabetes, Well Controlled, Type 2, Oral Hypoglycemic Agents  Renal/GU negative Renal ROS     Musculoskeletal   Abdominal Normal abdominal exam  (+)   Peds negative pediatric ROS (+)  Hematology negative hematology ROS (+)   Anesthesia Other Findings   Reproductive/Obstetrics                             Anesthesia Physical Anesthesia Plan  ASA: 2  Anesthesia Plan: General   Post-op Pain Management:    Induction: Intravenous  PONV Risk Score and Plan:   Airway Management Planned: Natural Airway  Additional Equipment:   Intra-op Plan:   Post-operative Plan:   Informed Consent: I have reviewed the patients History and Physical, chart, labs and discussed the procedure including the risks, benefits and alternatives for the proposed anesthesia with the patient or authorized representative who has indicated his/her understanding and acceptance.       Plan Discussed with: CRNA and Surgeon  Anesthesia Plan Comments:         Anesthesia Quick Evaluation

## 2022-04-24 NOTE — Anesthesia Postprocedure Evaluation (Signed)
Anesthesia Post Note  Patient: Tanner Frye  Procedure(s) Performed: COLONOSCOPY WITH PROPOFOL  Patient location during evaluation: PACU Anesthesia Type: General Level of consciousness: awake and sedated Pain management: pain level controlled Respiratory status: nonlabored ventilation Cardiovascular status: stable Anesthetic complications: no   No notable events documented.   Last Vitals:  Vitals:   04/24/22 0706 04/24/22 0808  BP: (!) 146/97 107/78  Pulse: 62   Resp: 16 15  Temp: (!) 36.1 C (!) 35.7 C  SpO2: 97% 98%    Last Pain:  Vitals:   04/24/22 0828  TempSrc:   PainSc: 0-No pain                 VAN STAVEREN,Chibuike Fleek

## 2022-04-25 NOTE — Progress Notes (Signed)
Post call note feedback from patient. Tanner Frye would prefer a different type of prep than clenpik next time.

## 2022-04-27 ENCOUNTER — Encounter: Payer: Self-pay | Admitting: Family Medicine

## 2022-04-27 ENCOUNTER — Ambulatory Visit: Payer: 59 | Admitting: Family Medicine

## 2022-04-27 VITALS — BP 129/89 | HR 86 | Temp 98.2°F | Wt 243.0 lb

## 2022-04-27 DIAGNOSIS — S46912A Strain of unspecified muscle, fascia and tendon at shoulder and upper arm level, left arm, initial encounter: Secondary | ICD-10-CM

## 2022-04-27 DIAGNOSIS — G8929 Other chronic pain: Secondary | ICD-10-CM

## 2022-04-27 DIAGNOSIS — M25512 Pain in left shoulder: Secondary | ICD-10-CM | POA: Diagnosis not present

## 2022-04-27 DIAGNOSIS — R202 Paresthesia of skin: Secondary | ICD-10-CM | POA: Diagnosis not present

## 2022-04-27 NOTE — Progress Notes (Addendum)
Subjective:    Patient ID: Tanner Frye, male    DOB: 11-23-1975, 46 y.o.   MRN: [id number]  Tanner Frye is a 46 y.o. male presenting on 04/27/2022 for Shoulder Pain   HPI  Left Shoulder Pain Left Upper Extremity Paresthesia   Patient reports that original injury took place on January 26, 2022 when he was crawling in a crawlspace and described he felt his shoulder pop and felt pain. He has experienced pain and difficulty with this Left Shoulder since that date for the past >3 months.  He reports a gradual worsening not improved at first. Worse if holding in forward fixed position worse and also some above head motion with paresthesia into left hand He tried Chiropractor without results He has done some resistance band exercises with improvement in past few weeks. Asking about Physical Therapy going forward Takes Advil PRN in AM and Gabapentin 100mg  x 2 = 200mg  in AM with improvement.  Denies any trauma or other injury     04/27/2022    9:23 AM 03/27/2022    9:09 AM 08/05/2020    3:34 PM  Depression screen PHQ 2/9  Decreased Interest 0 0 0  Down, Depressed, Hopeless 0 0 0  PHQ - 2 Score 0 0 0  Altered sleeping 0 0   Tired, decreased energy 0 0   Change in appetite 0 0   Feeling bad or failure about yourself  0 0   Trouble concentrating 0 0   Moving slowly or fidgety/restless 0 0   Suicidal thoughts 0 0   PHQ-9 Score 0 0   Difficult doing work/chores Not difficult at all Not difficult at all     Social History   Tobacco Use   Smoking status: Never   Smokeless tobacco: Never  Vaping Use   Vaping Use: Never used  Substance Use Topics   Alcohol use: Yes    Alcohol/week: 0.0 standard drinks of alcohol    Comment: Wine or beer 1 -2  once or twice a week.   Drug use: No    Review of Systems Per HPI unless specifically indicated above     Objective:    BP 129/89 (BP Location: Right Arm, Patient Position: Sitting, Cuff Size: Normal)   Pulse 86   Temp 98.2 F  (36.8 C) (Temporal)   Wt 243 lb (110.2 kg)   SpO2 98%   BMI 29.58 kg/m   Wt Readings from Last 3 Encounters:  04/27/22 243 lb (110.2 kg)  04/24/22 230 lb (104.3 kg)  03/27/22 240 lb 3.2 oz (109 kg)    Physical Exam Vitals and nursing note reviewed.  Constitutional:      General: He is not in acute distress.    Appearance: Normal appearance. He is well-developed. He is not diaphoretic.     Comments: Well-appearing, comfortable, cooperative  HENT:     Head: Normocephalic and atraumatic.  Eyes:     General:        Right eye: No discharge.        Left eye: No discharge.     Conjunctiva/sclera: Conjunctivae normal.  Cardiovascular:     Rate and Rhythm: Normal rate.  Pulmonary:     Effort: Pulmonary effort is normal.  Musculoskeletal:     Comments: Left Shoulder Inspection: Normal appearance bilateral symmetrical Palpation: Non tender but hypertonicity of Left paraspinal thoracic spinal muscles sub scapular area ROM: Full intact active ROM forward flexion, abduction, internal / external rotation, symmetrical Strength:  Normal strength 5/5 flex/ext, ext rot / int rot, grip, rotator cuff str testing. Neurovascular: Distally intact pulses, sensation to light touch   Skin:    General: Skin is warm and dry.     Findings: No erythema or rash.  Neurological:     Mental Status: He is alert and oriented to person, place, and time.  Psychiatric:        Mood and Affect: Mood normal.        Behavior: Behavior normal.        Thought Content: Thought content normal.     Comments: Well groomed, good eye contact, normal speech and thoughts    Results for orders placed or performed during the hospital encounter of 04/24/22  Glucose, capillary  Result Value Ref Range   Glucose-Capillary 98 70 - 99 mg/dL      Assessment & Plan:   Problem List Items Addressed This Visit   None Visit Diagnoses     Chronic left shoulder pain    -  Primary   Relevant Orders   Ambulatory referral to  Physical Therapy   Paresthesia of left upper extremity       Relevant Orders   Ambulatory referral to Physical Therapy   Muscle strain of left scapular region, initial encounter       Relevant Orders   Ambulatory referral to Physical Therapy       Consistent with subacute Left shoulder problem with likely musculoskeletal etiology with muscle strain subscapular muscles and upper thoracic. Does not seem to be rotator cuff issue at this time. - Some repetitive strains on shoulder / back No clear etiology of injury.  Plan: 1. Gradual improvement so far on conservative therapy will continue the course and refer to physical Therapy in [place] at [facility].  Continue relative rest, ice/heat, OTC options as needed Future consider Spinal or shoulder x-rays and other therapy options if indicated, consider rx med management if needed   Orders Placed This Encounter  Procedures   Ambulatory referral to Physical Therapy    Referral Priority:   Routine    Referral Type:   Physical Medicine    Referral Reason:   Specialty Services Required    Requested Specialty:   Physical Therapy    Number of Visits Requested:   1     No orders of the defined types were placed in this encounter.     Follow up plan: Return if symptoms worsen or fail to improve.   Saralyn Pilar, DO [facility] 04/27/2022, 9:52 AM

## 2022-04-27 NOTE — Patient Instructions (Addendum)
Thank you for coming to the office today.  Referral to [place],    [id number] [address] Main: [phone]  Consider Theracane  Continue Gabapentin   Please schedule a Follow-up Appointment to: Return if symptoms worsen or fail to improve.  If you have any other questions or concerns, please feel free to call the office or send a message through [place]. You may also schedule an earlier appointment if necessary.  Additionally, you may be receiving a survey about your experience at our office within a few days to 1 week by e-mail or mail. We value your feedback.  Nobie Putnam, DO [place]

## 2022-04-30 ENCOUNTER — Encounter: Payer: Self-pay | Admitting: Family Medicine

## 2022-05-12 ENCOUNTER — Encounter: Payer: Self-pay | Admitting: Physical Therapy

## 2022-05-12 ENCOUNTER — Ambulatory Visit: Payer: 59 | Attending: Family Medicine | Admitting: Physical Therapy

## 2022-05-12 DIAGNOSIS — M25512 Pain in left shoulder: Secondary | ICD-10-CM | POA: Diagnosis present

## 2022-05-12 DIAGNOSIS — S46912A Strain of unspecified muscle, fascia and tendon at shoulder and upper arm level, left arm, initial encounter: Secondary | ICD-10-CM | POA: Diagnosis not present

## 2022-05-12 DIAGNOSIS — M6281 Muscle weakness (generalized): Secondary | ICD-10-CM | POA: Diagnosis present

## 2022-05-12 DIAGNOSIS — G8929 Other chronic pain: Secondary | ICD-10-CM | POA: Insufficient documentation

## 2022-05-12 DIAGNOSIS — M542 Cervicalgia: Secondary | ICD-10-CM | POA: Insufficient documentation

## 2022-05-12 DIAGNOSIS — R202 Paresthesia of skin: Secondary | ICD-10-CM | POA: Diagnosis not present

## 2022-05-14 ENCOUNTER — Ambulatory Visit: Payer: 59 | Admitting: Physical Therapy

## 2022-05-14 DIAGNOSIS — M542 Cervicalgia: Secondary | ICD-10-CM | POA: Diagnosis not present

## 2022-05-14 DIAGNOSIS — G8929 Other chronic pain: Secondary | ICD-10-CM

## 2022-05-14 DIAGNOSIS — M6281 Muscle weakness (generalized): Secondary | ICD-10-CM

## 2022-05-16 NOTE — Therapy (Addendum)
OUTPATIENT PHYSICAL THERAPY SHOULDER EVALUATION   Patient Name: Tanner Frye MRN: [id number] DOB:August 26, 1976, 46 y.o., male Today's Date: 05/12/22   Treatment: 1 of 12 Recert date: 10/25/99 0932 to 0816  (77 minutes)   Past Medical History:  Diagnosis Date   Allergy    Anal fissure    Diabetes mellitus without complication ([place]) 3557   Family history of colorectal cancer    GERD (gastroesophageal reflux disease)    Hypertension 2021   Personal history of colonic polyps    Type 2 diabetes, controlled, with peripheral neuropathy ([place])    Past Surgical History:  Procedure Laterality Date   COLONOSCOPY WITH PROPOFOL N/A 04/27/2017   Procedure: COLONOSCOPY WITH PROPOFOL;  Surgeon: Jonathon Bellows, MD;  Location: [facility] ENDOSCOPY;  Service: Endoscopy;  Laterality: N/A;   COLONOSCOPY WITH PROPOFOL N/A 06/21/2020   Procedure: COLONOSCOPY WITH PROPOFOL;  Surgeon: Jonathon Bellows, MD;  Location: [facility] ENDOSCOPY;  Service: Gastroenterology;  Laterality: N/A;   COLONOSCOPY WITH PROPOFOL N/A 04/24/2022   Procedure: COLONOSCOPY WITH PROPOFOL;  Surgeon: Jonathon Bellows, MD;  Location: [facility] ENDOSCOPY;  Service: Gastroenterology;  Laterality: N/A;   HERNIA REPAIR  1980   Patient Active Problem List   Diagnosis Date Noted   Type 2 diabetes, controlled, with peripheral neuropathy ([place]) 11/20/2020   Genetic testing 10/02/2020   Family history of colorectal cancer    Personal history of colonic polyps    Encounter for Department of Transportation (DOT) examination for trucking license 32/20/2542   Type 2 diabetes mellitus with other specified complication ([place]) 70/62/3762   Hyperlipidemia associated with type 2 diabetes mellitus ([place]) 03/29/2018   Elevated LFTs 03/29/2018   BMI 29.0-29.9,adult 03/25/2018   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 06/11/2017   Pes planus 06/11/2017   Abnormal gait 06/11/2017   Essential hypertension 06/19/2014   Family history of hyperlipidemia 06/19/2014     PCP: Olin Hauser, DO  REFERRING PROVIDER: Olin Hauser, DO  REFERRING DIAG:  Diagnosis  [phone] (ICD-10-CM) - Chronic left shoulder pain  R20.2 (ICD-10-CM) - Paresthesia of left upper extremity  S46.912A (ICD-10-CM) - Muscle strain of left scapular region, initial encounter    THERAPY DIAG:  Neck pain on left side  Chronic left shoulder pain  Muscle weakness (generalized)  Rationale for Evaluation and Treatment Rehabilitation  ONSET DATE: 01/17/22  (chronic)  SUBJECTIVE:                                                                                                                                                                                      SUBJECTIVE STATEMENT: Pt. Reports progressive worsening of L shoulder/ scapular pain.  Pt. Reports L  UE numbness and tenderness over L rhomboid musculature.  R hand dominant.    PERTINENT HISTORY: See MD notes.  Pt. Known to PT clinic.    PAIN:  Are you having pain? Yes: NPRS scale: 2/10 Pain location: L UT/ scapular region Pain description: aching/ numbness/ tight Aggravating factors: resisted ex. Relieving factors: rest/ massage  PRECAUTIONS: None  WEIGHT BEARING RESTRICTIONS No  FALLS:  Has patient fallen in last 6 months? No  LIVING ENVIRONMENT: Lives with: lives with their spouse Lives in: House/apartment  OCCUPATION: Business owner  PLOF: Independent  PATIENT GOALS Decrease L UT/ shoulder/ scapular pain.   OBJECTIVE:   DIAGNOSTIC FINDINGS:  N/A  PATIENT SURVEYS:  FOTO initial 72/ goal 74  COGNITION:  Overall cognitive status: Within functional limits for tasks assessed     SENSATION: WFL  POSTURE: Slight rounded shoulders (able to correct)  UPPER EXTREMITY ROM:   B UE AROM WNL.  Cervical AROM WNL  UPPER EXTREMITY MMT:   B UE muscle strength grossly 5/5 MMT.  Discomfort in L UT with resisted L shoulder flexion/ abduction.    SHOULDER SPECIAL  TESTS:  Impingement tests: Neer impingement test: negative and Hawkins/Kennedy impingement test: negative  SLAP lesions: Clunk test: negative  Rotator cuff assessment: Empty can test: negative  Biceps assessment: Speed's test: negative  JOINT MOBILITY TESTING:  Slight L shoulder hypomobility as compared to R shoulder.  Unable to reproduce pain symptoms.    PALPATION:  (+) L UT/ rhomboid muscle tenderness.  Moderate trigger points noted with palpation.     TODAY'S TREATMENT:  Evaluation/ Reviewed gym based ex.  (Pt. Has recumbent bike/ elliptical/ TM/ sauna/ free weights)- home gym   PATIENT EDUCATION: Education details: HEP Person educated: Patient Education method: Customer service manager Education comprehension: verbalized understanding   HOME EXERCISE PROGRAM: Home gym exercise.    ASSESSMENT:  CLINICAL IMPRESSION: Patient is a pleasant 46 y.o. male who was seen today for physical therapy evaluation and treatment for L UT/ shoulder pain.  Pt. Presents with good cervical/ B shoulder AROM (all planes of movement).  Good UE muscle strength.  Pt. Has moderate trigger points in B UT/ rhomboid/ subscapular musculature.  Pt. Will benefit from developing progressive HEP and trigger point dry needling for pain mgmt/ return to prior level of function.     OBJECTIVE IMPAIRMENTS decreased mobility, decreased ROM, decreased strength, hypomobility, impaired flexibility, impaired UE functional use, postural dysfunction, and pain.   ACTIVITY LIMITATIONS carrying, lifting, and reach over head  PARTICIPATION LIMITATIONS: occupation  [place] and Past/current experiences are also affecting patient's functional outcome.   REHAB POTENTIAL: Excellent  CLINICAL DECISION MAKING: Stable/uncomplicated  EVALUATION COMPLEXITY: Low   GOALS: Goals reviewed with patient? Yes  SHORT TERM GOALS: Target date: 06/02/22  Pt. Will be independent with HEP to increase pain-free  cervical/ shoulder AROM and develop progressive strengthening program.   Baseline:  Pain limited with UE ex.  Goal status: INITIAL   LONG TERM GOALS: Target date: 06/23/22  Pt. Will increase FOTO to 78 to improve pain-free mobility.   Baseline:  initial FOTO: 72 Goal status: INITIAL  2.  Pt. Will reports no tenderness/ pain over L UT/ shoulder to improve pain-free mobility.   Baseline: (+) tenderness/ trigger points.  Goal status: INITIAL    PLAN: PT FREQUENCY: 1-2x/week  PT DURATION: 6 weeks  PLANNED INTERVENTIONS: Therapeutic exercises, Therapeutic activity, Neuromuscular re-education, Patient/Family education, Self Care, Joint mobilization, Joint manipulation, Dry Needling, Spinal mobilization, Cryotherapy, Moist heat,  and Manual therapy  PLAN FOR NEXT SESSION: Discuss HEP  Pura Spice, PT, DPT # [phone] 05/14/2022, 7:10 PM

## 2022-05-16 NOTE — Therapy (Addendum)
OUTPATIENT PHYSICAL THERAPY SHOULDER TREATMENT   Patient Name: Tanner Frye MRN: [id number] DOB:04/29/1976, 46 y.o., male Today's Date: 05/14/22   Treatment: 2 of 12 Recert date: 11/23/93 6387 to 0820  (53 minutes)   Past Medical History:  Diagnosis Date   Allergy    Anal fissure    Diabetes mellitus without complication ([place]) 5643   Family history of colorectal cancer    GERD (gastroesophageal reflux disease)    Hypertension 2021   Personal history of colonic polyps    Type 2 diabetes, controlled, with peripheral neuropathy ([place])    Past Surgical History:  Procedure Laterality Date   COLONOSCOPY WITH PROPOFOL N/A 04/27/2017   Procedure: COLONOSCOPY WITH PROPOFOL;  Surgeon: Jonathon Bellows, MD;  Location: [facility] ENDOSCOPY;  Service: Endoscopy;  Laterality: N/A;   COLONOSCOPY WITH PROPOFOL N/A 06/21/2020   Procedure: COLONOSCOPY WITH PROPOFOL;  Surgeon: Jonathon Bellows, MD;  Location: [facility] ENDOSCOPY;  Service: Gastroenterology;  Laterality: N/A;   COLONOSCOPY WITH PROPOFOL N/A 04/24/2022   Procedure: COLONOSCOPY WITH PROPOFOL;  Surgeon: Jonathon Bellows, MD;  Location: [facility] ENDOSCOPY;  Service: Gastroenterology;  Laterality: N/A;   HERNIA REPAIR  1980   Patient Active Problem List   Diagnosis Date Noted   Type 2 diabetes, controlled, with peripheral neuropathy ([place]) 11/20/2020   Genetic testing 10/02/2020   Family history of colorectal cancer    Personal history of colonic polyps    Encounter for Department of Transportation (DOT) examination for trucking license 32/95/1884   Type 2 diabetes mellitus with other specified complication ([place]) 16/60/6301   Hyperlipidemia associated with type 2 diabetes mellitus ([place]) 03/29/2018   Elevated LFTs 03/29/2018   BMI 29.0-29.9,adult 03/25/2018   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 06/11/2017   Pes planus 06/11/2017   Abnormal gait 06/11/2017   Essential hypertension 06/19/2014   Family history of hyperlipidemia 06/19/2014     PCP: Olin Hauser, DO  REFERRING PROVIDER: Olin Hauser, DO  REFERRING DIAG:  Diagnosis  [phone] (ICD-10-CM) - Chronic left shoulder pain  R20.2 (ICD-10-CM) - Paresthesia of left upper extremity  S46.912A (ICD-10-CM) - Muscle strain of left scapular region, initial encounter    THERAPY DIAG:  Neck pain on left side  Chronic left shoulder pain  Muscle weakness (generalized)  Rationale for Evaluation and Treatment Rehabilitation  ONSET DATE: 01/17/22  (chronic)  SUBJECTIVE:                                                                                                                                                                                      SUBJECTIVE STATEMENT:  EVALUATION Pt. Reports progressive worsening of L shoulder/ scapular pain.  Pt.  Reports L UE numbness and tenderness over L rhomboid musculature.  R hand dominant.    PERTINENT HISTORY: See MD notes.  Pt. Known to PT clinic.    PAIN:  Are you having pain? Yes: NPRS scale: 2/10 Pain location: L UT/ scapular region Pain description: aching/ numbness/ tight Aggravating factors: resisted ex. Relieving factors: rest/ massage  PRECAUTIONS: None  WEIGHT BEARING RESTRICTIONS No  FALLS:  Has patient fallen in last 6 months? No  LIVING ENVIRONMENT: Lives with: lives with their spouse Lives in: House/apartment  OCCUPATION: Business owner  PLOF: Independent  PATIENT GOALS Decrease L UT/ shoulder/ scapular pain.   OBJECTIVE:   DIAGNOSTIC FINDINGS:  N/A  PATIENT SURVEYS:  FOTO initial 72/ goal 59  COGNITION:  Overall cognitive status: Within functional limits for tasks assessed     SENSATION: WFL  POSTURE: Slight rounded shoulders (able to correct)  UPPER EXTREMITY ROM:   B UE AROM WNL.  Cervical AROM WNL  UPPER EXTREMITY MMT:   B UE muscle strength grossly 5/5 MMT.  Discomfort in L UT with resisted L shoulder flexion/ abduction.    SHOULDER SPECIAL  TESTS:  Impingement tests: Neer impingement test: negative and Hawkins/Kennedy impingement test: negative  SLAP lesions: Clunk test: negative  Rotator cuff assessment: Empty can test: negative  Biceps assessment: Speed's test: negative  JOINT MOBILITY TESTING:  Slight L shoulder hypomobility as compared to R shoulder.  Unable to reproduce pain symptoms.    PALPATION:  (+) L UT/ rhomboid muscle tenderness.  Moderate trigger points noted with palpation.     TODAY'S TREATMENT:   05/14/22  Subjective:  pt. Reports soreness in L UT after last tx. Session.    There.ex.:    Nautilus: 50# lat. Pull downs/ scapular retraction/ tricep extension 20x eacn.  30# IR on L/R and 20# shoulder ER (difficulty on L)- 20x each.    Discussed gym based ex.  Manual tx.:    Trigger Point Dry Needling (TDN)   Education performed with patient regarding potential benefit of TDN. Reviewed precautions and risks with patient.  Pt provided verbal consent to treatment. TDN performed to L and R with 5, 0.25 x 33m L-type single needle placements with 2 local twitch response (LTR). Pistoning technique utilized. Improved pain-free motion following intervention.  Good tx. Tolerance.  STM to B UT after tx./ use of Hypervolt    PATIENT EDUCATION: Education details: HEP Person educated: Patient Education method: ECustomer service managerEducation comprehension: verbalized understanding   HOME EXERCISE PROGRAM: Home gym exercise.    ASSESSMENT:  CLINICAL IMPRESSION: Patient demonstrates good technique with Nautilus ex. And will continue with gym based ex in a pain tolerable range every other day.   Pt. Has moderate trigger points in B UT/ rhomboid/ subscapular musculature.  Good tx. Tolerance with TDN to UT musculature with soreness reported.  Muscle fasciculation noted in trigger points.  Pt. Will benefit from developing progressive HEP and trigger point dry needling for pain mgmt/ return to prior level of  function.     OBJECTIVE IMPAIRMENTS decreased mobility, decreased ROM, decreased strength, hypomobility, impaired flexibility, impaired UE functional use, postural dysfunction, and pain.   ACTIVITY LIMITATIONS carrying, lifting, and reach over head  PARTICIPATION LIMITATIONS: occupation  [place] Past/current experiences are also affecting patient's functional outcome.   REHAB POTENTIAL: Excellent  CLINICAL DECISION MAKING: Stable/uncomplicated  EVALUATION COMPLEXITY: Low   GOALS: Goals reviewed with patient? Yes  SHORT TERM GOALS: Target date: 06/02/22  Pt. Will be  independent with HEP to increase pain-free cervical/ shoulder AROM and develop progressive strengthening program.   Baseline:  Pain limited with UE ex.  Goal status: INITIAL   LONG TERM GOALS: Target date: 06/23/22  Pt. Will increase FOTO to 78 to improve pain-free mobility.   Baseline:  initial FOTO: 72 Goal status: INITIAL  2.  Pt. Will reports no tenderness/ pain over L UT/ shoulder to improve pain-free mobility.   Baseline: (+) tenderness/ trigger points.  Goal status: INITIAL    PLAN: PT FREQUENCY: 1-2x/week  PT DURATION: 6 weeks  PLANNED INTERVENTIONS: Therapeutic exercises, Therapeutic activity, Neuromuscular re-education, Patient/Family education, Self Care, Joint mobilization, Joint manipulation, Dry Needling, Spinal mobilization, Cryotherapy, Moist heat, and Manual therapy  PLAN FOR NEXT SESSION: Discuss HEP  Pura Spice, PT, DPT # [id number] 05/16/2022, 7:33 PM

## 2022-05-19 ENCOUNTER — Ambulatory Visit: Payer: 59 | Attending: Family Medicine | Admitting: Physical Therapy

## 2022-05-21 ENCOUNTER — Ambulatory Visit: Payer: 59 | Admitting: Physical Therapy

## 2022-05-26 ENCOUNTER — Ambulatory Visit: Payer: 59 | Admitting: Physical Therapy

## 2022-05-27 ENCOUNTER — Telehealth: Payer: Self-pay | Admitting: Cardiovascular Disease

## 2022-05-27 NOTE — Telephone Encounter (Signed)
Per wife on dpr not needed at this time.  Getting meds from pcp will fu prn.   Deleted recall per request

## 2022-05-28 ENCOUNTER — Ambulatory Visit: Payer: 59 | Admitting: Physical Therapy

## 2022-06-02 ENCOUNTER — Ambulatory Visit: Payer: 59 | Admitting: Physical Therapy

## 2022-06-04 ENCOUNTER — Ambulatory Visit: Payer: 59 | Admitting: Physical Therapy

## 2022-06-05 ENCOUNTER — Encounter: Payer: Self-pay | Admitting: Internal Medicine

## 2022-06-08 ENCOUNTER — Telehealth: Payer: Self-pay | Admitting: Pharmacy Technician

## 2022-06-08 ENCOUNTER — Other Ambulatory Visit (HOSPITAL_COMMUNITY): Payer: Self-pay

## 2022-06-08 NOTE — Telephone Encounter (Signed)
  Patient Advocate Encounter   Received notification from RMA/Office that prior authorization for Ozempic is required/requested.   PA submitted on 06/08/22 to [facility] via [place] Status is pending  Pharmacy Patient Advocate Fax:  [phone]

## 2022-06-08 NOTE — Telephone Encounter (Signed)
-----   Message from Lauralyn Primes, [place] sent at 06/05/2022  4:56 PM EDT ----- Regarding: PA Pt was advised a PA was now needed for Ozempic. Thank you.

## 2022-06-09 ENCOUNTER — Other Ambulatory Visit (HOSPITAL_COMMUNITY): Payer: Self-pay

## 2022-06-09 ENCOUNTER — Encounter: Payer: 59 | Admitting: Physical Therapy

## 2022-06-09 NOTE — Telephone Encounter (Signed)
Patient Advocate Encounter  Prior Authorization for Ozempic '2mg'$  has been approved.    PA# [id number] Effective dates: through 06/09/23   Per Test Claim RX filled today.

## 2022-06-11 ENCOUNTER — Ambulatory Visit: Payer: 59 | Admitting: Physical Therapy

## 2022-06-16 ENCOUNTER — Encounter: Payer: 59 | Admitting: Physical Therapy

## 2022-06-18 ENCOUNTER — Ambulatory Visit: Payer: 59 | Admitting: Physical Therapy

## 2022-06-21 ENCOUNTER — Telehealth: Payer: 59 | Admitting: Nurse Practitioner

## 2022-06-21 DIAGNOSIS — J020 Streptococcal pharyngitis: Secondary | ICD-10-CM

## 2022-06-21 MED ORDER — AMOXICILLIN 500 MG PO CAPS: 500.0000 mg | ORAL_CAPSULE | Freq: Two times a day (BID) | ORAL | 0 refills | Status: AC

## 2022-06-21 NOTE — Progress Notes (Signed)

## 2022-06-21 NOTE — Progress Notes (Signed)
I have spent 5 minutes in review of e-visit questionnaire, review and updating patient chart, medical decision making and response to patient.  ° °Tanner Schaumburg W Marliyah Reid, NP ° °  °

## 2022-06-23 ENCOUNTER — Ambulatory Visit (INDEPENDENT_AMBULATORY_CARE_PROVIDER_SITE_OTHER): Payer: 59 | Admitting: Internal Medicine

## 2022-06-23 ENCOUNTER — Encounter: Payer: Self-pay | Admitting: Internal Medicine

## 2022-06-23 VITALS — BP 136/88 | HR 87 | Temp 97.3°F | Wt 241.0 lb

## 2022-06-23 DIAGNOSIS — K121 Other forms of stomatitis: Secondary | ICD-10-CM | POA: Diagnosis not present

## 2022-06-23 MED ORDER — MAGIC MOUTHWASH W/LIDOCAINE: 10.0000 mL | Freq: Four times a day (QID) | ORAL | 0 refills | Status: DC

## 2022-06-23 NOTE — Progress Notes (Signed)
Subjective:    Patient ID: Tanner Frye, male    DOB: 02/29/1976, 46 y.o.   MRN: [id number]  HPI  Patient presents to clinic today with complaint of sore throat. He report this stared 3 days ago. He does have some painful swallowing. He reports the pain radiates into his left ear. He reports associated fatigue, headache and body aches. He denies runny nose, nasal congestion, cough or SOB. He denies fever or chills. He has had a negative covid test at home. He has not had sick contacts.  He did an E's visit 9/3 for the same and was prescribed Amoxicillin for strep pharyngitis but reports the medicine is not helping. He has not tried anything OTC for this.  Review of Systems  Past Medical History:  Diagnosis Date   Allergy    Anal fissure    Diabetes mellitus without complication ([place]) 4696   Family history of colorectal cancer    GERD (gastroesophageal reflux disease)    Hypertension 2021   Personal history of colonic polyps    Type 2 diabetes, controlled, with peripheral neuropathy ([facility])     Current Outpatient Medications  Medication Sig Dispense Refill   amoxicillin (AMOXIL) 500 MG capsule Take 1 capsule (500 mg total) by mouth 2 (two) times daily for 10 days. 20 capsule 0   Ascorbic Acid (VITAMIN C) 1000 MG tablet Take 1,000 mg by mouth daily.     Blood Glucose Monitoring Suppl (FREESTYLE LITE) DEVI Use to check blood sugar up to twice a day as advised. 1 each 0   EPINEPHrine (EPIPEN 2-PAK) 0.3 mg/0.3 mL IJ SOAJ injection Inject 0.3 mg into the muscle as needed for anaphylaxis. 1 each 1   FREESTYLE LITE test strip Use to check blood sugar up to twice a day as advised. 200 each 3   gabapentin (NEURONTIN) 100 MG capsule Take 3 capsules (300 mg total) by mouth at bedtime. 270 capsule 1   Lancets (FREESTYLE) lancets Use to check blood sugar up to twice a day as advised. 100 each 12   Loratadine 10 MG CAPS      omeprazole (PRILOSEC) 20 MG capsule Take 10 mg by mouth daily.      Semaglutide,0.25 or 0.'5MG'$ /DOS, (OZEMPIC, 0.25 OR 0.5 MG/DOSE,) 2 MG/3ML SOPN 0.25 mg weekly for 4 weeks then 0.5 mg 3 mL 4   No current facility-administered medications for this visit.    Allergies  Allergen Reactions   Bee Venom    Oatmeal Swelling   Other Swelling    Processed foods that are baked   Prednisone Other (See Comments)    Other reaction(s): Other (See Comments) Effects mood verbal per patient Other reaction(s): Other (See Comments) Effects mood verbal per patient Effects mood verbal per patient Other reaction(s): Other (See Comments) Effects mood verbal per patient Effects mood verbal per patient    Family History  Problem Relation Age of Onset   Hypertension Mother    Hyperlipidemia Mother    Rectal cancer Brother 78   Cancer Brother    Rectal cancer Paternal Aunt    Colon cancer Paternal Uncle    Prostate cancer Neg Hx     Social History   Socioeconomic History   Marital status: Married    Spouse name: Not on file   Number of children: Not on file   Years of education: Vocational Degree   Highest education level: Associate degree: occupational, Hotel manager, or vocational program  Occupational History   Occupation: Armed forces operational officer  Tobacco Use   Smoking status: Never   Smokeless tobacco: Never  Vaping Use   Vaping Use: Never used  Substance and Sexual Activity   Alcohol use: Yes    Alcohol/week: 0.0 standard drinks of alcohol    Comment: Wine or beer 1 -2  once or twice a week.   Drug use: No   Sexual activity: Yes    Partners: Female    Birth control/protection: Surgical  Other Topics Concern   Not on file  Social History Narrative   Right Handed    Lives in a two story home   Social Determinants of Health   Financial Resource Strain: Not on file  Food Insecurity: Not on file  Transportation Needs: Not on file  Physical Activity: Not on file  Stress: Not on file  Social Connections: Not on file  Intimate Partner Violence: Not on file      Constitutional: Pt reports fatigue. Denies fever, malaise, headache or abrupt weight changes.  HEENT: Patient reports sore throat.  Denies eye pain, eye redness, ear pain, ringing in the ears, wax buildup, runny nose, nasal congestion, bloody nose. Respiratory: Denies difficulty breathing, shortness of breath, cough or sputum production.   Cardiovascular: Denies chest pain, chest tightness, palpitations or swelling in the hands or feet.  Musculoskeletal: Pt reports body aches. Denies decrease in range of motion, difficulty with gait, or joint pain and swelling.  Skin: Denies redness, rashes, lesions or ulcercations.   No other specific complaints in a complete review of systems (except as listed in HPI above).     Objective:   Physical Exam   BP 136/88 (BP Location: Left Arm, Patient Position: Sitting, Cuff Size: Normal)   Pulse 87   Temp (!) 97.3 F (36.3 C) (Temporal)   Wt 241 lb (109.3 kg)   SpO2 99%   BMI 29.34 kg/m   Wt Readings from Last 3 Encounters:  04/27/22 243 lb (110.2 kg)  04/24/22 230 lb (104.3 kg)  03/27/22 240 lb 3.2 oz (109 kg)    General: Appears his stated age, overweight, in NAD. Skin: Warm, dry and intact. No rashes noted. HEENT: Head: normal shape and size; Eyes: sclera white, no icterus, conjunctiva pink, PERRLA and EOMs intact; Ears: Tm's gray and intact, normal light reflex; Throat/Mouth: Teeth present, mucosa pink and moist, no exudate noted. Ulcer noted on left tonsillar pillar. Neck:  No adenopathy noted. Cardiovascular: Normal rate and rhythm.  Pulmonary/Chest: Normal effort and positive vesicular breath sounds. No respiratory distress    BMET    Component Value Date/Time   NA 137 03/18/2022 0917   NA 139 06/19/2014 1433   K 3.9 03/18/2022 0917   CL 100 03/18/2022 0917   CO2 30 03/18/2022 0917   GLUCOSE 111 (H) 03/18/2022 0917   BUN 15 03/18/2022 0917   BUN 11 06/19/2014 1433   CREATININE 0.85 03/18/2022 0917   CREATININE 0.86  09/09/2020 0841   CALCIUM 10.1 03/18/2022 0917   GFRNONAA 106 09/09/2020 0841   GFRAA 123 09/09/2020 0841    Lipid Panel     Component Value Date/Time   CHOL 213 (H) 03/18/2022 0917   CHOL 244 (H) 06/19/2014 1433   TRIG 280.0 (H) 03/18/2022 0917   HDL 51.30 03/18/2022 0917   HDL 42 06/19/2014 1433   CHOLHDL 4 03/18/2022 0917   VLDL 56.0 (H) 03/18/2022 0917   LDLCALC 141 (H) 03/29/2019 0937    CBC    Component Value Date/Time   WBC 7.5 03/27/2022  0936   RBC 5.23 03/27/2022 0936   HGB 15.2 03/27/2022 0936   HGB 16.0 11/30/2013 1228   HCT 45.8 03/27/2022 0936   HCT 46.9 11/30/2013 1228   PLT 289 03/27/2022 0936   PLT 287 11/30/2013 1228   MCV 87.6 03/27/2022 0936   MCV 86 11/30/2013 1228   MCH 29.1 03/27/2022 0936   MCHC 33.2 03/27/2022 0936   RDW 12.6 03/27/2022 0936   RDW 14.1 06/19/2014 1433   RDW 13.3 11/30/2013 1228   LYMPHSABS 2,753 03/27/2022 0936   LYMPHSABS 3.4 (H) 06/19/2014 1433   LYMPHSABS 3.0 11/30/2013 1228   MONOABS 0.7 07/27/2018 1332   MONOABS 0.7 11/30/2013 1228   EOSABS 120 03/27/2022 0936   EOSABS 0.2 06/19/2014 1433   EOSABS 0.2 11/30/2013 1228   BASOSABS 38 03/27/2022 0936   BASOSABS 0.0 06/19/2014 1433   BASOSABS 0.1 11/30/2013 1228    Hgb A1C Lab Results  Component Value Date   HGBA1C 5.3 03/18/2022           Assessment & Plan:   [facility] Follow Up for Strep Pharyngitis:  E visit note reviewed No evidence of strep on exam, stop Amoxicillin RX for Magic Mouthwash with Lidocaine 10 mL 4 x day for 5 days   Return precautions discussed Webb Silversmith, NP

## 2022-06-23 NOTE — Patient Instructions (Signed)
Oral Ulcers Oral ulcers are small sores inside the mouth or near the mouth. They may occur on or inside the lips, inside the cheeks, on the tongue, or anywhere else inside or near the mouth. They may be called canker sores or cold sores, which are two types of oral ulcers. Many oral ulcers are harmless and go away on their own. In some cases, oral ulcers may require medical care to determine the cause and proper treatment. What are the causes? Common causes of this condition include: Infections caused by viruses, bacteria, or fungi. Emotional stress. Foods or chemicals that irritate the mouth. Injury or physical irritation of the mouth. Medicines. Allergies. Tobacco use. Less common causes include: Skin disease. A type of herpes virus infection (herpes simplex or herpes zoster). Oral cancer. In some cases, the cause may not be known. What increases the risk? You are more likely to develop this condition if: You wear dental braces, dentures, or retainers. You do not take good care of your mouth and teeth (poor oral hygiene). You have sensitive skin. You have a condition that affects the entire body (systemic condition), such as an immune disorder. What are the signs or symptoms? The main symptom of this condition is having one or more oval-shaped or round ulcers that have red borders. Symptoms may vary depending on the cause. This includes: Location of the ulcers. Ulcers may be found inside the mouth, on the gums, or on the insides of the lips or cheeks. They may also be found on the lips or on skin that is near the mouth, such as the cheeks or chin. Pain. Ulcers can be painful and uncomfortable, or they can be painless. Appearance of the ulcers. They may look like red blisters and be filled with fluid, or they may be white or yellow patches. Frequency of outbreaks. Ulcers may go away permanently after one outbreak, or they may come back (recur) often or rarely. How is this  diagnosed? This condition is diagnosed with a physical exam. Your health care provider may ask you questions about your lifestyle and your medical history. You may have tests, including: Blood tests. Removal of a small number of cells from an ulcer to be examined under a microscope (biopsy). How is this treated? Treatment depends on the severity and cause of the condition. Oral ulcers often go away on their own in 1-2 weeks. Treatment may include medicines, such as: Medicines to treat a viral infection (antivirals), a bacterial infection (antibiotics), or a fungal infection (antifungals). Medicines to help control pain. This may include: Over-the-counter pain medicines. Gel, cream, or spray to numb the area (topical anesthetic) if you have severe pain. Other medicines to coat or numb your mouth. Follow these instructions at home: Medicines Take or use over-the-counter and prescription medicines only as told by your health care provider. If you were prescribed an antibiotic medicine, take it as told by your health care provider. Do not stop taking the antibiotic even if you start to feel better. Do not use products that contain benzocaine (including numbing gels) to treat teething or mouth pain in children who are younger than 2 years. These products may cause a rare but serious blood condition. Eating and drinking Eat a balanced diet. Do not eat: Spicy foods. Citrus, such as oranges. Other foods and drinks that you think may cause or irritate your ulcers. Drink enough fluid to keep your urine pale yellow. Avoid drinking alcohol. Lifestyle  Practice good oral hygiene: Gently brush your teeth  with a soft toothbrush two times a day. Floss your teeth every day. Get regular dental cleanings and checkups. Do not use any products that contain nicotine or tobacco. These products include cigarettes, chewing tobacco, and vaping devices, such as e-cigarettes. If you need help quitting, ask your  health care provider. Managing pain If directed, put ice on your face in the affected area to help reduce pain. To do this: Put ice in a plastic bag. Place a towel between your skin and the bag. Leave the ice on for 20 minutes, 2-3 times a day. Remove the ice if your skin turns bright red. This is very important. If you cannot feel pain, heat, or cold, you have a greater risk of damage to the area. Avoid physical or chemical irritants that may have caused the ulcers or made them worse, such as mouthwashes that contain alcohol (ethanol). If you wear dental braces, dentures, or retainers, work with your health care provider to make sure these devices are fitted correctly. If you were prescribed a prescription mouthwash to help reduce pain in your mouth, use it as told by your health care provider. General instructions Rinse your mouth with a mixture of salt and water 3-4 times a day or as told by your health care provider. To make salt water, completely dissolve -1 tsp (3-6 g) of salt in 1 cup (237 mL) of warm water. Keep all follow-up visits. This is important. Contact a health care provider if: You have: Pain that gets worse or does not get better with medicine. Four or more ulcers at one time. A fever. New ulcers that look or feel different from other ulcers you have. Inflammation in one eye or both eyes. Ulcers that do not go away after 10 days. You develop new symptoms in your mouth, such as: Bleeding or crusting around your lips or gums. Tooth pain. Difficulty swallowing. You develop symptoms on your skin or genitals, such as: A rash or blisters. Burning or itching sensations. Your ulcers begin or get worse after you start a new medicine. Get help right away if: You have difficulty breathing. You have swelling in your face or neck. You have excessive bleeding from your mouth. You have severe pain. Summary Oral ulcers may occur anywhere inside or near the mouth. They can be  caused by many things, such as infections, stress, injury or irritation, or tobacco use. Oral ulcers can be painful or painless. Treatment may include medicines to relieve pain or to treat an infection (if appropriate). Most oral ulcers go away in 1-2 weeks. This information is not intended to replace advice given to you by your health care provider. Make sure you discuss any questions you have with your health care provider. Document Revised: 07/16/2021 Document Reviewed: 07/16/2021 Else[employer] Patient Education  [employer].

## 2022-07-03 ENCOUNTER — Encounter: Payer: Self-pay | Admitting: Internal Medicine

## 2022-07-03 ENCOUNTER — Ambulatory Visit: Payer: 59 | Admitting: Internal Medicine

## 2022-07-03 VITALS — BP 134/86 | HR 75 | Temp 96.9°F | Wt 243.0 lb

## 2022-07-03 DIAGNOSIS — Z024 Encounter for examination for driving license: Secondary | ICD-10-CM

## 2022-07-03 NOTE — Progress Notes (Signed)
Commercial Driver Medical Examination   Tanner Frye is a 46 y.o. male who presents today for a commercial driver fitness determination physical exam. The patient reports no problems. He has a history of HTN, HLD and DM 2, last A1C 5.3%, 02/2022. The following portions of the patient's history were reviewed and updated as appropriate: allergies, current medications, past family history, past medical history, past social history, past surgical history, and problem list. Review of Systems  Past Medical History:  Diagnosis Date   Allergy    Anal fissure    Diabetes mellitus without complication ([place]) 8250   Family history of colorectal cancer    GERD (gastroesophageal reflux disease)    Hypertension 2021   Personal history of colonic polyps    Type 2 diabetes, controlled, with peripheral neuropathy ([place])     Current Outpatient Medications  Medication Sig Dispense Refill   Ascorbic Acid (VITAMIN C) 1000 MG tablet Take 1,000 mg by mouth daily.     Blood Glucose Monitoring Suppl (FREESTYLE LITE) DEVI Use to check blood sugar up to twice a day as advised. 1 each 0   EPINEPHrine (EPIPEN 2-PAK) 0.3 mg/0.3 mL IJ SOAJ injection Inject 0.3 mg into the muscle as needed for anaphylaxis. 1 each 1   FREESTYLE LITE test strip Use to check blood sugar up to twice a day as advised. 200 each 3   gabapentin (NEURONTIN) 100 MG capsule Take 3 capsules (300 mg total) by mouth at bedtime. 270 capsule 1   Lancets (FREESTYLE) lancets Use to check blood sugar up to twice a day as advised. 100 each 12   Loratadine 10 MG CAPS      omeprazole (PRILOSEC) 20 MG capsule Take 10 mg by mouth daily.     Semaglutide,0.25 or 0.'5MG'$ /DOS, (OZEMPIC, 0.25 OR 0.5 MG/DOSE,) 2 MG/3ML SOPN 0.25 mg weekly for 4 weeks then 0.5 mg 3 mL 4   magic mouthwash w/lidocaine SOLN Take 10 mLs by mouth 4 (four) times daily. 240 mL 0   No current facility-administered medications for this visit.    Allergies  Allergen Reactions   Bee Venom     Oatmeal Swelling   Other Swelling    Processed foods that are baked   Prednisone Other (See Comments)    Other reaction(s): Other (See Comments) Effects mood verbal per patient Other reaction(s): Other (See Comments) Effects mood verbal per patient Effects mood verbal per patient Other reaction(s): Other (See Comments) Effects mood verbal per patient Effects mood verbal per patient    Family History  Problem Relation Age of Onset   Hypertension Mother    Hyperlipidemia Mother    Rectal cancer Brother 68   Cancer Brother    Rectal cancer Paternal Aunt    Colon cancer Paternal Uncle    Prostate cancer Neg Hx     Social History   Socioeconomic History   Marital status: Married    Spouse name: Not on file   Number of children: Not on file   Years of education: Vocational Degree   Highest education level: Associate degree: occupational, Hotel manager, or vocational program  Occupational History   Occupation: Armed forces operational officer  Tobacco Use   Smoking status: Never   Smokeless tobacco: Never  Vaping Use   Vaping Use: Never used  Substance and Sexual Activity   Alcohol use: Yes    Alcohol/week: 0.0 standard drinks of alcohol    Comment: Wine or beer 1 -2  once or twice a week.   Drug  use: No   Sexual activity: Yes    Partners: Female    Birth control/protection: Surgical  Other Topics Concern   Not on file  Social History Narrative   Right Handed    Lives in a two story home   Social Determinants of Health   Financial Resource Strain: Not on file  Food Insecurity: Not on file  Transportation Needs: Not on file  Physical Activity: Not on file  Stress: Not on file  Social Connections: Not on file  Intimate Partner Violence: Not on file     Constitutional: Denies fever, malaise, fatigue, headache or abrupt weight changes.  HEENT: Denies eye pain, eye redness, ear pain, ringing in the ears, wax buildup, runny nose, nasal congestion, bloody nose, or sore  throat. Respiratory: Denies difficulty breathing, shortness of breath, cough or sputum production.   Cardiovascular: Denies chest pain, chest tightness, palpitations or swelling in the hands or feet.  Gastrointestinal: Denies abdominal pain, bloating, constipation, diarrhea or blood in the stool.  GU: Denies urgency, frequency, pain with urination, burning sensation, blood in urine, odor or discharge. Musculoskeletal: Denies decrease in range of motion, difficulty with gait, muscle pain or joint pain and swelling.  Skin: Denies redness, rashes, lesions or ulcercations.  Neurological: Denies dizziness, difficulty with memory, difficulty with speech or problems with balance and coordination.  Psych: Denies anxiety, depression, SI/HI.  No other specific complaints in a complete review of systems (except as listed in HPI above).   Objective:    Vision:  Uncorrected Corrected Horizontal Field of Vision  Right Eye  20/20 70 degrees  Left Eye   20/25 70 degrees  Both Eyes   13/24    Applicant  can recognize and distinguish among traffic control signals and devices showing standard red, green, and amber colors.  Applicant meets visual acuity requirement only when wearing corrective lenses.  Monocular Vision?: No   Hearing:        Right Ear  > 5 ft     Left Ear  > 5 ft       Urinalysis: Specific Gravity: 1.025; Glucose: negative; Protein: negative; Blood: negative  BP 134/86 (BP Location: Left Arm, Patient Position: Sitting, Cuff Size: Normal)   Pulse 75   Temp (!) 96.9 F (36.1 C) (Temporal)   Wt 243 lb (110.2 kg)   SpO2 98%   BMI 29.58 kg/m  Wt Readings from Last 3 Encounters:  07/03/22 243 lb (110.2 kg)  06/23/22 241 lb (109.3 kg)  04/27/22 243 lb (110.2 kg)    General: Appears his stated age, overweight, in NAD. Skin: Warm, dry and intact. No ulcerations noted. HEENT: Head: normal shape and size; Eyes: sclera white, no icterus, conjunctiva pink, PERRLA and EOMs intact;  Ears: Tm's gray and intact, normal light reflex; Nose: mucosa pink and moist, septum midline; Neck:  Neck supple, trachea midline. No masses, lumps or thyromegaly present.  Cardiovascular: Normal rate and rhythm. S1,S2 noted.  No murmur, rubs or gallops noted. No JVD or BLE edema. No carotid bruits noted. Pulmonary/Chest: Normal effort and positive vesicular breath sounds. No respiratory distress. No wheezes, rales or ronchi noted.  Abdomen: Soft and nontender. Normal bowel sounds. No distention or masses noted. Musculoskeletal: Normal range of motion.  Strength 5/5 BUE/BLE.  No difficulty with gait.  Neurological: Alert and oriented. Cranial nerves II-XII grossly intact. Coordination normal.  Psychiatric: Mood and affect normal. Behavior is normal. Judgment and thought content normal.     BMET  Component Value Date/Time   NA 137 03/18/2022 0917   NA 139 06/19/2014 1433   K 3.9 03/18/2022 0917   CL 100 03/18/2022 0917   CO2 30 03/18/2022 0917   GLUCOSE 111 (H) 03/18/2022 0917   BUN 15 03/18/2022 0917   BUN 11 06/19/2014 1433   CREATININE 0.85 03/18/2022 0917   CREATININE 0.86 09/09/2020 0841   CALCIUM 10.1 03/18/2022 0917   GFRNONAA 106 09/09/2020 0841   GFRAA 123 09/09/2020 0841    Lipid Panel     Component Value Date/Time   CHOL 213 (H) 03/18/2022 0917   CHOL 244 (H) 06/19/2014 1433   TRIG 280.0 (H) 03/18/2022 0917   HDL 51.30 03/18/2022 0917   HDL 42 06/19/2014 1433   CHOLHDL 4 03/18/2022 0917   VLDL 56.0 (H) 03/18/2022 0917   LDLCALC 141 (H) 03/29/2019 0937    CBC    Component Value Date/Time   WBC 7.5 03/27/2022 0936   RBC 5.23 03/27/2022 0936   HGB 15.2 03/27/2022 0936   HGB 16.0 11/30/2013 1228   HCT 45.8 03/27/2022 0936   HCT 46.9 11/30/2013 1228   PLT 289 03/27/2022 0936   PLT 287 11/30/2013 1228   MCV 87.6 03/27/2022 0936   MCV 86 11/30/2013 1228   MCH 29.1 03/27/2022 0936   MCHC 33.2 03/27/2022 0936   RDW 12.6 03/27/2022 0936   RDW 14.1 06/19/2014  1433   RDW 13.3 11/30/2013 1228   LYMPHSABS 2,753 03/27/2022 0936   LYMPHSABS 3.4 (H) 06/19/2014 1433   LYMPHSABS 3.0 11/30/2013 1228   MONOABS 0.7 07/27/2018 1332   MONOABS 0.7 11/30/2013 1228   EOSABS 120 03/27/2022 0936   EOSABS 0.2 06/19/2014 1433   EOSABS 0.2 11/30/2013 1228   BASOSABS 38 03/27/2022 0936   BASOSABS 0.0 06/19/2014 1433   BASOSABS 0.1 11/30/2013 1228    Hgb A1C Lab Results  Component Value Date   HGBA1C 5.3 03/18/2022        Labs: Lab Results  Component Value Date   SPECGRAV 1.010 01/26/2020   PROTEINUR Negative 01/26/2020   BILIRUBINUR negative 01/26/2020      Assessment:    Healthy male exam.  Meets standards in 37 CFR 391.41;  qualifies for 2 year certificate.    Plan:    Medical examiners certificate completed and printed. Return as needed.    Webb Silversmith, NP

## 2022-07-19 ENCOUNTER — Encounter: Payer: Self-pay | Admitting: Internal Medicine

## 2022-07-21 ENCOUNTER — Telehealth: Payer: Self-pay

## 2022-07-21 ENCOUNTER — Other Ambulatory Visit: Payer: Self-pay | Admitting: Internal Medicine

## 2022-07-21 MED ORDER — PREGABALIN 50 MG PO CAPS: 50.0000 mg | ORAL_CAPSULE | Freq: Two times a day (BID) | ORAL | 3 refills | Status: DC

## 2022-07-21 MED ORDER — SEMAGLUTIDE (1 MG/DOSE) 4 MG/3ML ~~LOC~~ SOPN: 1.0000 mg | PEN_INJECTOR | SUBCUTANEOUS | 3 refills | Status: DC

## 2022-07-21 NOTE — Telephone Encounter (Signed)
Patient Advocate Encounter   Received notification that prior authorization for Ozempic (1 MG/DOSE) '4MG'$ /3ML pen-injectors is required/requested.  PA submitted on 07/21/22 to [facility] via CoverMyMeds Key [id number]  Status is pending

## 2022-07-22 ENCOUNTER — Other Ambulatory Visit (HOSPITAL_COMMUNITY): Payer: Self-pay

## 2022-07-22 NOTE — Telephone Encounter (Signed)
Patient Advocate Encounter  Prior Authorization for Ozempic (1 MG/DOSE) '4MG'$ /3ML pen-injector was previously approved on [id number] from 06/08/2022 to 06/09/2023.  Patient will be able to fill prescription for this medication at pharmacy

## 2022-09-15 ENCOUNTER — Ambulatory Visit: Payer: 59 | Admitting: Internal Medicine

## 2022-09-23 ENCOUNTER — Ambulatory Visit: Payer: 59 | Admitting: Internal Medicine

## 2022-09-23 ENCOUNTER — Encounter: Payer: Self-pay | Admitting: Internal Medicine

## 2022-09-23 VITALS — BP 128/80 | HR 86 | Ht 76.0 in | Wt 239.2 lb

## 2022-09-23 DIAGNOSIS — G6289 Other specified polyneuropathies: Secondary | ICD-10-CM | POA: Diagnosis not present

## 2022-09-23 DIAGNOSIS — E1142 Type 2 diabetes mellitus with diabetic polyneuropathy: Secondary | ICD-10-CM | POA: Diagnosis not present

## 2022-09-23 DIAGNOSIS — E782 Mixed hyperlipidemia: Secondary | ICD-10-CM | POA: Insufficient documentation

## 2022-09-23 HISTORY — DX: Mixed hyperlipidemia: E78.2

## 2022-09-23 LAB — POCT GLYCOSYLATED HEMOGLOBIN (HGB A1C): Hemoglobin A1C: 5 % (ref 4.0–5.6)

## 2022-09-23 NOTE — Patient Instructions (Addendum)
Please continue: - Ozempic 1 mg weekly  Please come back for a follow-up appointment in 6 months.

## 2022-09-23 NOTE — Progress Notes (Signed)
Patient ID: Tanner Frye, male   DOB: [birth year], 45 y.o.   MRN: [id number]   HPI: Tanner Frye is a 46 y.o.-year-old male, initially referred by his PCP, Nobie Putnam *, returning for follow-up for DM2, dx in 2021, controlled, with complications (peripheral neuropathy).  Last visit 6 months ago.  Interim hx: He has neuropathy discomfort in his feet and occasional pain.  Since last visit, he had to come off Ozempic due to lack of insurance coverage until he tried metformin.  He is now on this medication. He lost 1 lb since last OV. No increased urination, blurry vision, nausea, chest pain. He had more foot burning >> I suggested Lyrica >> he actually started [place] fruit >> helps.  Reviewed HbA1c: Lab Results  Component Value Date   HGBA1C 5.3 03/18/2022   HGBA1C 5.8 (A) 08/26/2021   HGBA1C 5.5 02/19/2021   HGBA1C 6.2 (A) 11/20/2020   HGBA1C 6.0 (A) 08/01/2020   HGBA1C 8.9 (H) 01/26/2020   HGBA1C 6.8 (H) 03/29/2019   HGBA1C 6.2 (H) 03/25/2018   He is on: - Ozempic 0.5 mg weekly - started 08/2021 >> 1 mg weekly  He was on: - metformin 500 mg 2x a day with meals  Pt checks his sugars 0-1x a day: - am: 100-120 >> 116-126 >> 115-120 >> 110s >> 80s-100 - 2h after b'fast: n/c >> 130 >> n/c >> 139 >> n/c >> 130 - before lunch: n/c >> 123 >> 90s >> n/c - 2h after lunch: n/c >> 101 >> n/c >> 130 - before dinner: 88-107, 134 >> 90s >> 90-100 >> 90s - 2h after dinner: n/c >> 105 >> 115-120 >> n/c - bedtime: n/c >> 96 >> n/c - nighttime: n/c Lowest sugar was 80s >> 90; it is unclear if he has hypoglycemia awareness.  Highest sugar was 120s >> 139.  Glucometer: Freestyle lite  Pt's meals are: - Breakfast: 2 sugar-free jello cups + 2 eggs + 2 pieces of bacon >> oatmeal - Lunch: prev. Fast food >> now home cooked: chicken + veggies - Dinner: same - Snacks: no desserts, apples, carrots, crackers. No sweet drinks, but water, unsweet tea. Used to drink OJ. He saw nutrition  at [facility] in the past -he felt that this was helpful.  - no CKD, last BUN/creatinine:  Lab Results  Component Value Date   BUN 15 03/18/2022   BUN 12 11/20/2020   CREATININE 0.85 03/18/2022   CREATININE 0.92 11/20/2020  On losartan 100.  -+ HL; last set of lipids: Lab Results  Component Value Date   CHOL 213 (H) 03/18/2022   HDL 51.30 03/18/2022   LDLCALC 141 (H) 03/29/2019   LDLDIRECT 130.0 03/18/2022   TRIG 280.0 (H) 03/18/2022   CHOLHDL 4 03/18/2022  He is not on a statin.  Of note, he had a coronary calcium score of 0.  - last eye exam was in Spring 2023. No DR reportedly.   - + numbness and tingling in his feet. No pain. Started mid 2021.  Last foot exam per neurology 12/17/2021.  At that time, it was considered that his neuropathy was related to diabetes. Since he only had numbness at that time, no treatment recommendation was made since none of the available medications help in the absence of pain. Alpha-lipoic acid twice a day did not help significantly. Also, L-me-folate did not help.  Exercise helps and his symptoms are increased if he takes a break from exercising for more than 2 to 3  days.    Pt has FH of prediabetes in M.  He also has a history of transaminitis (increased ALT) and also HTN.  At last visit, we investigated his peripheral neuropathy-B6 vitamin was high, but normalized after stopping multivitamins: Office Visit on 02/19/2021  Component Date Value Ref Range Status   Vitamin B6 02/19/2021 15.0  2.1 - 21.7 ng/mL Final   Office Visit on 11/20/2020  Component Date Value Ref Range Status   TSH 11/20/2020 1.98  0.35 - 4.50 uIU/mL Final   Vitamin B-12 11/20/2020 537  211 - 911 pg/mL Final   Vitamin B6 11/20/2020 42.0* 2.1 - 21.7 ng/mL Final   Ferritin 11/20/2020 49.9  22.0 - 322.0 ng/mL Final   We also started ALA 600 mg 2x a day, but this did not help.  ROS: + see HPI  I reviewed pt's medications, allergies, PMH, social hx, family hx, and changes were  documented in the history of present illness. Otherwise, unchanged from my initial visit note.  Past Medical History:  Diagnosis Date   Allergy    Anal fissure    Diabetes mellitus without complication ([place]) 5643   Family history of colorectal cancer    GERD (gastroesophageal reflux disease)    Hypertension 2021   Personal history of colonic polyps    Type 2 diabetes, controlled, with peripheral neuropathy ([place])    Past Surgical History:  Procedure Laterality Date   COLONOSCOPY WITH PROPOFOL N/A 04/27/2017   Procedure: COLONOSCOPY WITH PROPOFOL;  Surgeon: Jonathon Bellows, MD;  Location: [facility] ENDOSCOPY;  Service: Endoscopy;  Laterality: N/A;   COLONOSCOPY WITH PROPOFOL N/A 06/21/2020   Procedure: COLONOSCOPY WITH PROPOFOL;  Surgeon: Jonathon Bellows, MD;  Location: [facility] ENDOSCOPY;  Service: Gastroenterology;  Laterality: N/A;   COLONOSCOPY WITH PROPOFOL N/A 04/24/2022   Procedure: COLONOSCOPY WITH PROPOFOL;  Surgeon: Jonathon Bellows, MD;  Location: [facility] ENDOSCOPY;  Service: Gastroenterology;  Laterality: N/A;   HERNIA REPAIR  1980   Social History   Socioeconomic History   Marital status: Married    Spouse name: Not on file   Number of children: Not on file   Years of education: Vocational Degree   Highest education level: Associate degree: occupational, Hotel manager, or vocational program  Occupational History   Occupation: Armed forces operational officer  Tobacco Use   Smoking status: Never   Smokeless tobacco: Never  Vaping Use   Vaping Use: Never used  Substance and Sexual Activity   Alcohol use: Yes    Alcohol/week: 0.0 standard drinks of alcohol    Comment: Wine or beer 1 -2  once or twice a week.   Drug use: No   Sexual activity: Yes    Partners: Female    Birth control/protection: Surgical  Other Topics Concern   Not on file  Social History Narrative   Right Handed    Lives in a two story home   Social Determinants of Health   Financial Resource Strain: Not on file  Food Insecurity: Not on  file  Transportation Needs: Not on file  Physical Activity: Not on file  Stress: Not on file  Social Connections: Not on file  Intimate Partner Violence: Not on file   Current Outpatient Medications on File Prior to Visit  Medication Sig Dispense Refill   Ascorbic Acid (VITAMIN C) 1000 MG tablet Take 1,000 mg by mouth daily.     Blood Glucose Monitoring Suppl (FREESTYLE LITE) DEVI Use to check blood sugar up to twice a day as advised. 1 each 0  EPINEPHrine (EPIPEN 2-PAK) 0.3 mg/0.3 mL IJ SOAJ injection Inject 0.3 mg into the muscle as needed for anaphylaxis. 1 each 1   FREESTYLE LITE test strip Use to check blood sugar up to twice a day as advised. 200 each 3   Lancets (FREESTYLE) lancets Use to check blood sugar up to twice a day as advised. 100 each 12   Loratadine 10 MG CAPS      omeprazole (PRILOSEC) 20 MG capsule Take 10 mg by mouth daily.     pregabalin (LYRICA) 50 MG capsule Take 1 capsule (50 mg total) by mouth 2 (two) times daily. 60 capsule 3   Semaglutide, 1 MG/DOSE, 4 MG/3ML SOPN Inject 1 mg as directed once a week. 9 mL 3   No current facility-administered medications on file prior to visit.   Allergies  Allergen Reactions   Bee Venom    Oatmeal Swelling   Other Swelling    Processed foods that are baked   Prednisone Other (See Comments)    Other reaction(s): Other (See Comments) Effects mood verbal per patient Other reaction(s): Other (See Comments) Effects mood verbal per patient Effects mood verbal per patient Other reaction(s): Other (See Comments) Effects mood verbal per patient Effects mood verbal per patient   Family History  Problem Relation Age of Onset   Hypertension Mother    Hyperlipidemia Mother    Rectal cancer Brother 20   Cancer Brother    Rectal cancer Paternal Aunt    Colon cancer Paternal Uncle    Prostate cancer Neg Hx    PE: BP 128/80 (BP Location: Right Arm, Patient Position: Sitting, Cuff Size: Normal)   Pulse 86   Ht '6\' 4"'$  (1.93  m)   Wt 239 lb 3.2 oz (108.5 kg)   SpO2 99%   BMI 29.12 kg/m  Wt Readings from Last 3 Encounters:  09/23/22 239 lb 3.2 oz (108.5 kg)  07/03/22 243 lb (110.2 kg)  06/23/22 241 lb (109.3 kg)   Constitutional: overweight, in NAD Eyes: PERRLA, EOMI, no exophthalmos ENT: moist mucous membranes, no thyromegaly, no cervical lymphadenopathy Cardiovascular: RRR, No MRG Respiratory: CTA B Musculoskeletal: no deformities Skin: moist, warm, no rashes Neurological: no tremor with outstretched hands  ASSESSMENT: 1. DM2, non-insulin-dependent, controlled, controlled, with complications - PN  2. HL  3.  Peripheral neuropathy  PLAN:  1. Patient with history of prediabetes, but a diagnosis of type 2 diabetes since 2021.  At that time, HbA1c returned high, at 8.9% blood sugars improved significantly afterwards, with an HbA1c of 5.3% at last visit. His this was diet controlled until the beginning of the year when he requested to try Ozempic.  He did well on the medication, losing 20 pounds after starting it, however, insurance did not cover it without trying other medications.  Tried metformin but this did not work well for him so he is now back on Ozempic. -At last visit, he inquired about the persistent need for Cozaar.We discussed that, in the setting of hypertension, this was a very good option since it also offered kidney protection.  He did inquire about whether he could possibly come off after losing weight.  In the absence of microalbuminuria, we discussed that he could probably come off at that time.  He gained 3 pounds since last visit, though. -At today's visit, sugars are excellent, at goal throughout the day.  We can continue the same dose of Ozempic. - I suggested to:  Patient Instructions  Please continue: - Ozempic 1  mg weekly  Please come back for a follow-up appointment in 6 months.  - we checked his HbA1c: 5.0% (lower) - advised to check sugars at different times of the day -  1x a day, rotating check times - advised for yearly eye exams >> he is UTD - return to clinic in 6 months  2. HL - Reviewed latest lipid panel: LDL above our target of less than 70, triglycerides high: Lab Results  Component Value Date   CHOL 213 (H) 03/18/2022   HDL 51.30 03/18/2022   LDLCALC 141 (H) 03/29/2019   LDLDIRECT 130.0 03/18/2022   TRIG 280.0 (H) 03/18/2022   CHOLHDL 4 03/18/2022  -After the results of above returned, I recommended a low-dose pravastatin, 20 mg daily.  At today's visit, he mentions that he did not see the message that I sent him through [facility].  He thinks that his wife may have checked it.  I offered to send a prescription for this to his pharmacy, but he would like to see cardiology and check with them.  3. Peripheral neuropathy -Developed around the time when his diabetes became uncontrolled but his diabetes control improved afterwards with persistent neuropathy symptoms. He described no sensation in the front part of his sole and increase sensitivity mid foot. -Of note, his brother also developed neuropathy, but after chemotherapy -Symptoms improved with exercising  -Usually he has numbness, but without discomfort -He tried gabapentin, alpha lipoid acid, L-methylfolate without any improvement in symptoms. -B6 level was high so we stopped his multivitamins but then normalized B6 level afterwards did not help with symptoms -B12 vitamin and ferritin levels were normal -No history of excess alcohol or toxic exposures. -He is now seen neurology-Dr. Posey Pronto.His neuropathy was still deemed due to his diabetes despite the mild nature of this.  Since he only has numbness, only conservative measures were recommended.  Philemon Kingdom, MD PhD [facility] End[facility]

## 2022-09-24 ENCOUNTER — Other Ambulatory Visit: Payer: Self-pay | Admitting: Internal Medicine

## 2022-09-24 ENCOUNTER — Encounter: Payer: Self-pay | Admitting: Internal Medicine

## 2022-09-24 MED ORDER — PRAVASTATIN SODIUM 20 MG PO TABS: 20.0000 mg | ORAL_TABLET | Freq: Every day | ORAL | 3 refills | Status: DC

## 2022-10-23 LAB — HM DIABETES EYE EXAM

## 2022-11-03 ENCOUNTER — Encounter: Payer: Self-pay | Admitting: Internal Medicine

## 2022-12-21 ENCOUNTER — Telehealth: Payer: Self-pay

## 2022-12-21 NOTE — Telephone Encounter (Signed)
PA request received via CMM for Ozempic (1 MG/DOSE) '4MG'$ /3ML pen-injectors  PA has been submitted to [facility] and is pending determination  Key: [id number]

## 2022-12-22 ENCOUNTER — Other Ambulatory Visit (HOSPITAL_COMMUNITY): Payer: Self-pay

## 2022-12-22 DIAGNOSIS — E291 Testicular hypofunction: Secondary | ICD-10-CM | POA: Diagnosis not present

## 2022-12-22 DIAGNOSIS — R5382 Chronic fatigue, unspecified: Secondary | ICD-10-CM | POA: Diagnosis not present

## 2022-12-22 DIAGNOSIS — Z79890 Hormone replacement therapy (postmenopausal): Secondary | ICD-10-CM | POA: Diagnosis not present

## 2022-12-22 NOTE — Telephone Encounter (Signed)
Pharmacy Patient Advocate Encounter  Prior Authorization for Ozempic '1mg'$  has been approved by [employer] [place] ([place]).    PA # [id number] Effective dates: 12/21/22 through 12/21/23 Already filled yesterday.

## 2022-12-28 DIAGNOSIS — M6281 Muscle weakness (generalized): Secondary | ICD-10-CM | POA: Diagnosis not present

## 2022-12-28 DIAGNOSIS — R6882 Decreased libido: Secondary | ICD-10-CM | POA: Diagnosis not present

## 2022-12-28 DIAGNOSIS — E785 Hyperlipidemia, unspecified: Secondary | ICD-10-CM | POA: Diagnosis not present

## 2022-12-28 DIAGNOSIS — E119 Type 2 diabetes mellitus without complications: Secondary | ICD-10-CM | POA: Diagnosis not present

## 2023-01-13 DIAGNOSIS — Z86018 Personal history of other benign neoplasm: Secondary | ICD-10-CM | POA: Diagnosis not present

## 2023-01-13 DIAGNOSIS — L570 Actinic keratosis: Secondary | ICD-10-CM | POA: Diagnosis not present

## 2023-01-13 DIAGNOSIS — Z872 Personal history of diseases of the skin and subcutaneous tissue: Secondary | ICD-10-CM | POA: Diagnosis not present

## 2023-01-13 DIAGNOSIS — L578 Other skin changes due to chronic exposure to nonionizing radiation: Secondary | ICD-10-CM | POA: Diagnosis not present

## 2023-01-26 DIAGNOSIS — R5382 Chronic fatigue, unspecified: Secondary | ICD-10-CM | POA: Diagnosis not present

## 2023-01-26 DIAGNOSIS — E782 Mixed hyperlipidemia: Secondary | ICD-10-CM | POA: Diagnosis not present

## 2023-01-26 DIAGNOSIS — Z79890 Hormone replacement therapy (postmenopausal): Secondary | ICD-10-CM | POA: Diagnosis not present

## 2023-01-26 DIAGNOSIS — E559 Vitamin D deficiency, unspecified: Secondary | ICD-10-CM | POA: Diagnosis not present

## 2023-01-26 DIAGNOSIS — D649 Anemia, unspecified: Secondary | ICD-10-CM | POA: Diagnosis not present

## 2023-01-26 DIAGNOSIS — E291 Testicular hypofunction: Secondary | ICD-10-CM | POA: Diagnosis not present

## 2023-01-26 DIAGNOSIS — E119 Type 2 diabetes mellitus without complications: Secondary | ICD-10-CM | POA: Diagnosis not present

## 2023-02-03 DIAGNOSIS — Z1339 Encounter for screening examination for other mental health and behavioral disorders: Secondary | ICD-10-CM | POA: Diagnosis not present

## 2023-02-03 DIAGNOSIS — Z1331 Encounter for screening for depression: Secondary | ICD-10-CM | POA: Diagnosis not present

## 2023-02-03 DIAGNOSIS — E559 Vitamin D deficiency, unspecified: Secondary | ICD-10-CM | POA: Diagnosis not present

## 2023-02-03 DIAGNOSIS — R790 Abnormal level of blood mineral: Secondary | ICD-10-CM | POA: Diagnosis not present

## 2023-02-03 DIAGNOSIS — Z6829 Body mass index (BMI) 29.0-29.9, adult: Secondary | ICD-10-CM | POA: Diagnosis not present

## 2023-02-03 DIAGNOSIS — E782 Mixed hyperlipidemia: Secondary | ICD-10-CM | POA: Diagnosis not present

## 2023-02-15 DIAGNOSIS — R5382 Chronic fatigue, unspecified: Secondary | ICD-10-CM | POA: Diagnosis not present

## 2023-02-15 DIAGNOSIS — E291 Testicular hypofunction: Secondary | ICD-10-CM | POA: Diagnosis not present

## 2023-02-15 DIAGNOSIS — Z79890 Hormone replacement therapy (postmenopausal): Secondary | ICD-10-CM | POA: Diagnosis not present

## 2023-02-18 DIAGNOSIS — M6281 Muscle weakness (generalized): Secondary | ICD-10-CM | POA: Diagnosis not present

## 2023-02-18 DIAGNOSIS — R5382 Chronic fatigue, unspecified: Secondary | ICD-10-CM | POA: Diagnosis not present

## 2023-02-18 DIAGNOSIS — E291 Testicular hypofunction: Secondary | ICD-10-CM | POA: Diagnosis not present

## 2023-02-18 DIAGNOSIS — E559 Vitamin D deficiency, unspecified: Secondary | ICD-10-CM | POA: Diagnosis not present

## 2023-03-31 ENCOUNTER — Encounter: Payer: Self-pay | Admitting: Family Medicine

## 2023-03-31 ENCOUNTER — Ambulatory Visit (INDEPENDENT_AMBULATORY_CARE_PROVIDER_SITE_OTHER): Payer: BC Managed Care – PPO | Admitting: Family Medicine

## 2023-03-31 VITALS — BP 120/80 | HR 81 | Resp 16 | Ht 76.0 in | Wt 256.4 lb

## 2023-03-31 DIAGNOSIS — E785 Hyperlipidemia, unspecified: Secondary | ICD-10-CM

## 2023-03-31 DIAGNOSIS — I10 Essential (primary) hypertension: Secondary | ICD-10-CM

## 2023-03-31 DIAGNOSIS — E1169 Type 2 diabetes mellitus with other specified complication: Secondary | ICD-10-CM | POA: Diagnosis not present

## 2023-03-31 DIAGNOSIS — E1142 Type 2 diabetes mellitus with diabetic polyneuropathy: Secondary | ICD-10-CM

## 2023-03-31 DIAGNOSIS — Z125 Encounter for screening for malignant neoplasm of prostate: Secondary | ICD-10-CM | POA: Diagnosis not present

## 2023-03-31 DIAGNOSIS — Z0000 Encounter for general adult medical examination without abnormal findings: Secondary | ICD-10-CM

## 2023-03-31 NOTE — Assessment & Plan Note (Signed)
Improved A1c 5.0 Complications - peripheral neuropathy, hyperlipidemia, GERD, increases risk of future cardiovascular complications   Managed by Endocrine  Plan:  1. Ozempic and Metformin 2. Encourage improved lifestyle - low carb, low sugar diet, reduce portion size, continue improving regular exercise 3. Check CBG , bring log to next visit for review 4. Continue ARB, - future Statin  They will check urine microalbumin and foot exam next visit

## 2023-03-31 NOTE — Assessment & Plan Note (Signed)
Improved on Statin Pravastatin 20mg  daily Encourage improved lifestyle - low carb/cholesterol, reduce portion size, continue improving regular exercise

## 2023-03-31 NOTE — Progress Notes (Signed)
Subjective:    Patient ID: Tanner Frye, male    DOB: 11-Jul-1976, 47 y.o.   MRN: [id number]  Tanner Frye is a 47 y.o. male presenting on 03/31/2023 for Annual Exam and Hypertension   HPI  CHRONIC DM, Type 2 BMI >31  Followed by Dr Marylin [facility] A1c to 5.0 last time Weight gain but he has gained muscle mass  CBGs: avg well controlled Meds: Ozempic 0.5mg  weekly On ARB Tried Lyrica for neuropathy but came off of it, did not help Last urine microalbumin negative 02/2022, due for repeat, next apt with Endocrine 04/07/23 Last DM Eye Exam 10/23/22 Lifestyle: - Diet (improving low carb low sugar)  Denies hypoglycemia, polyuria, visual changes, numbness or tingling.   HYPERLIPIDEMIA / Elevated LFTs. Normalized LFTs. Improved lipids.   Hypogonadism Low T Followed by clinic in [place] He is on testosterone injections Improved muscle mass   HYPERTENSION His goal is to come off his BP medication elevated BP on testosterone off meds improved back on meds Current Meds - HCTZ 25mg  daily, Losartan 100mg  daily   Reports good compliance, took meds today. Tolerating well, w/o complaints. Denies CP, dyspnea, HA, edema, dizziness / lightheadedness      Health Maintenance:  Last Colonoscopy 04/24/22 - negative, next due 04/2027, he will follow up with GI sooner most likely due to fam history of cancer.      03/31/2023    9:05 AM 04/27/2022    9:23 AM 03/27/2022    9:09 AM  Depression screen PHQ 2/9  Decreased Interest 0 0 0  Down, Depressed, Hopeless 0 0 0  PHQ - 2 Score 0 0 0  Altered sleeping 0 0 0  Tired, decreased energy 0 0 0  Change in appetite 0 0 0  Feeling bad or failure about yourself  0 0 0  Trouble concentrating 0 0 0  Moving slowly or fidgety/restless 0 0 0  Suicidal thoughts 0 0 0  PHQ-9 Score 0 0 0  Difficult doing work/chores Not difficult at all Not difficult at all Not difficult at all      03/31/2023    9:05 AM 04/27/2022    9:23 AM 03/27/2022     9:09 AM  GAD 7 : Generalized Anxiety Score  Nervous, Anxious, on Edge 0 0 0  Control/stop worrying 0 0 0  Worry too much - different things 0 0 0  Trouble relaxing 0 0 0  Restless 0 0 0  Easily annoyed or irritable 0 0 0  Afraid - awful might happen 0 0 0  Total GAD 7 Score 0 0 0  Anxiety Difficulty Not difficult at all Not difficult at all Not difficult at all      Past Medical History:  Diagnosis Date   Allergy    Anal fissure    Diabetes mellitus without complication (HCC) 2021   Family history of colorectal cancer    GERD (gastroesophageal reflux disease)    Hypertension 2021   Mixed hyperlipidemia 09/23/2022   Personal history of colonic polyps    Type 2 diabetes, controlled, with peripheral neuropathy (HCC)    Past Surgical History:  Procedure Laterality Date   COLONOSCOPY WITH PROPOFOL N/A 04/27/2017   Procedure: COLONOSCOPY WITH PROPOFOL;  Surgeon: Wyline Mood, MD;  Location: [facility] ENDOSCOPY;  Service: Endoscopy;  Laterality: N/A;   COLONOSCOPY WITH PROPOFOL N/A 06/21/2020   Procedure: COLONOSCOPY WITH PROPOFOL;  Surgeon: Wyline Mood, MD;  Location: [facility] ENDOSCOPY;  Service: Gastroenterology;  Laterality:  N/A;   COLONOSCOPY WITH PROPOFOL N/A 04/24/2022   Procedure: COLONOSCOPY WITH PROPOFOL;  Surgeon: Wyline Mood, MD;  Location: [facility] ENDOSCOPY;  Service: Gastroenterology;  Laterality: N/A;   HERNIA REPAIR  1980   Social History   Socioeconomic History   Marital status: Married    Spouse name: Not on file   Number of children: Not on file   Years of education: Vocational Degree   Highest education level: Associate degree: occupational, Scientist, product/process development, or vocational program  Occupational History   Occupation: Psychologist, sport and exercise  Tobacco Use   Smoking status: Never   Smokeless tobacco: Never  Vaping Use   Vaping Use: Never used  Substance and Sexual Activity   Alcohol use: Yes    Alcohol/week: 0.0 standard drinks of alcohol    Comment: Wine or beer 1 -2  once or twice a  week.   Drug use: No   Sexual activity: Yes    Partners: Female    Birth control/protection: Surgical  Other Topics Concern   Not on file  Social History Narrative   Right Handed    Lives in a two story home   Social Determinants of Health   Financial Resource Strain: Not on file  Food Insecurity: Not on file  Transportation Needs: Not on file  Physical Activity: Not on file  Stress: Not on file  Social Connections: Not on file  Intimate Partner Violence: Not on file   Family History  Problem Relation Age of Onset   Hypertension Mother    Hyperlipidemia Mother    Rectal cancer Brother 69   Cancer Brother    Rectal cancer Paternal Aunt    Colon cancer Paternal Uncle    Prostate cancer Neg Hx    Current Outpatient Medications on File Prior to Visit  Medication Sig   Ascorbic Acid (VITAMIN C) 1000 MG tablet Take 1,000 mg by mouth daily.   Blood Glucose Monitoring Suppl (FREESTYLE LITE) DEVI Use to check blood sugar up to twice a day as advised.   EPINEPHrine (EPIPEN 2-PAK) 0.3 mg/0.3 mL IJ SOAJ injection Inject 0.3 mg into the muscle as needed for anaphylaxis.   FREESTYLE LITE test strip Use to check blood sugar up to twice a day as advised.   hydrochlorothiazide (HYDRODIURIL) 25 MG tablet Take 25 mg by mouth daily.   Lancets (FREESTYLE) lancets Use to check blood sugar up to twice a day as advised.   Loratadine 10 MG CAPS    losartan (COZAAR) 100 MG tablet Take 100 mg by mouth daily.   pravastatin (PRAVACHOL) 20 MG tablet Take 1 tablet (20 mg total) by mouth daily.   testosterone cypionate (DEPOTESTOSTERONE CYPIONATE) 200 MG/ML injection Inject 200 mg into the muscle once a week.   Semaglutide, 1 MG/DOSE, 4 MG/3ML SOPN Inject 1 mg as directed once a week. (Patient not taking: Reported on 03/31/2023)   No current facility-administered medications on file prior to visit.    Review of Systems  Constitutional:  Negative for activity change, appetite change, chills,  diaphoresis, fatigue and fever.  HENT:  Negative for congestion and hearing loss.   Eyes:  Negative for visual disturbance.  Respiratory:  Negative for cough, chest tightness, shortness of breath and wheezing.   Cardiovascular:  Negative for chest pain, palpitations and leg swelling.  Gastrointestinal:  Negative for abdominal pain, constipation, diarrhea, nausea and vomiting.  Genitourinary:  Negative for dysuria, frequency and hematuria.  Musculoskeletal:  Negative for arthralgias and neck pain.  Skin:  Negative for  rash.  Neurological:  Negative for dizziness, weakness, light-headedness, numbness and headaches.  Hematological:  Negative for adenopathy.  Psychiatric/Behavioral:  Negative for behavioral problems, dysphoric mood and sleep disturbance.    Per HPI unless specifically indicated above      Objective:    BP 120/80 (BP Location: Right Arm, Patient Position: Sitting, Cuff Size: Normal)   Pulse 81   Resp 16   Ht 6\' 4"  (1.93 m)   Wt 256 lb 6.4 oz (116.3 kg)   BMI 31.21 kg/m   Wt Readings from Last 3 Encounters:  03/31/23 256 lb 6.4 oz (116.3 kg)  09/23/22 239 lb 3.2 oz (108.5 kg)  07/03/22 243 lb (110.2 kg)    Physical Exam Vitals and nursing note reviewed.  Constitutional:      General: He is not in acute distress.    Appearance: He is well-developed. He is not diaphoretic.     Comments: Well-appearing, comfortable, cooperative  HENT:     Head: Normocephalic and atraumatic.  Eyes:     General:        Right eye: No discharge.        Left eye: No discharge.     Conjunctiva/sclera: Conjunctivae normal.     Pupils: Pupils are equal, round, and reactive to light.  Neck:     Thyroid: No thyromegaly.     Vascular: No carotid bruit.  Cardiovascular:     Rate and Rhythm: Normal rate and regular rhythm.     Pulses: Normal pulses.     Heart sounds: Normal heart sounds. No murmur heard. Pulmonary:     Effort: Pulmonary effort is normal. No respiratory distress.      Breath sounds: Normal breath sounds. No wheezing or rales.  Abdominal:     General: Bowel sounds are normal. There is no distension.     Palpations: Abdomen is soft. There is no mass.     Tenderness: There is no abdominal tenderness.  Musculoskeletal:        General: No tenderness. Normal range of motion.     Cervical back: Normal range of motion and neck supple.     Right lower leg: No edema.     Left lower leg: No edema.     Comments: Upper / Lower Extremities: - Normal muscle tone, strength bilateral upper extremities 5/5, lower extremities 5/5  Lymphadenopathy:     Cervical: No cervical adenopathy.  Skin:    General: Skin is warm and dry.     Findings: No erythema or rash.  Neurological:     Mental Status: He is alert and oriented to person, place, and time.     Comments: Distal sensation intact to light touch all extremities  Psychiatric:        Mood and Affect: Mood normal.        Behavior: Behavior normal.        Thought Content: Thought content normal.     Comments: Well groomed, good eye contact, normal speech and thoughts    Results for orders placed or performed in visit on 11/04/22  HM DIABETES EYE EXAM  Result Value Ref Range   HM Diabetic Eye Exam No Retinopathy No Retinopathy      Assessment & Plan:   Problem List Items Addressed This Visit     Essential hypertension    Well-controlled HYPERTENSION back on meds No known complications    Plan:  1. Continue current BP regimen Losartan 100mg  daily and HCTZ 25mg  daily 2. Encourage improved  lifestyle - low sodium diet, regular exercise 3. Continue monitor BP outside office, bring readings to next visit, if persistently >140/90 or new symptoms notify office sooner       Relevant Medications   losartan (COZAAR) 100 MG tablet   hydrochlorothiazide (HYDRODIURIL) 25 MG tablet   Other Relevant Orders   COMPLETE METABOLIC PANEL WITH GFR   CBC with Differential/Platelet   Hyperlipidemia associated with type 2  diabetes mellitus (HCC)    Improved on Statin Pravastatin 20mg  daily Encourage improved lifestyle - low carb/cholesterol, reduce portion size, continue improving regular exercise      Relevant Medications   losartan (COZAAR) 100 MG tablet   hydrochlorothiazide (HYDRODIURIL) 25 MG tablet   Other Relevant Orders   Lipid panel   TSH   Type 2 diabetes mellitus with other specified complication (HCC)    Improved A1c 5.0 Complications - peripheral neuropathy, hyperlipidemia, GERD, increases risk of future cardiovascular complications   Managed by Endocrine  Plan:  1. Ozempic and Metformin 2. Encourage improved lifestyle - low carb, low sugar diet, reduce portion size, continue improving regular exercise 3. Check CBG , bring log to next visit for review 4. Continue ARB, - future Statin  They will check urine microalbumin and foot exam next visit      Relevant Medications   losartan (COZAAR) 100 MG tablet   Type 2 diabetes, controlled, with peripheral neuropathy (HCC)   Relevant Medications   losartan (COZAAR) 100 MG tablet   Other Visit Diagnoses     Annual physical exam    -  Primary   Relevant Orders   COMPLETE METABOLIC PANEL WITH GFR   CBC with Differential/Platelet   Lipid panel   Screening PSA (prostate specific antigen)       Relevant Orders   PSA   Diabetic polyneuropathy associated with type 2 diabetes mellitus (HCC)       Relevant Medications   losartan (COZAAR) 100 MG tablet       Updated Health Maintenance information Fasting labs ordered today, pending Encouraged improvement to lifestyle with diet and exercise Goal of weight loss  Keep up with Endocrinology.  They can do A1c, Urine Microalbumin test and Foot Exam.  We will do other blood work today. Stay tuned for results.  Next Colonoscopy 2028, check with GI if you want to pursue it sooner  Keep on BP medications. Let me know if need refills.  Orders Placed This Encounter  Procedures   COMPLETE  METABOLIC PANEL WITH GFR   CBC with Differential/Platelet   Lipid panel    Order Specific Question:   Has the patient fasted?    Answer:   Yes   PSA   TSH     No orders of the defined types were placed in this encounter.     Follow up plan: Return in about 1 year (around 03/30/2024) for 1 year Annual Physical AM early apt (fasting lab AFTER).  Saralyn Pilar, DO [facility] 03/31/2023, 9:11 AM

## 2023-03-31 NOTE — Assessment & Plan Note (Signed)
Well-controlled HYPERTENSION back on meds No known complications    Plan:  1. Continue current BP regimen Losartan 100mg  daily and HCTZ 25mg  daily 2. Encourage improved lifestyle - low sodium diet, regular exercise 3. Continue monitor BP outside office, bring readings to next visit, if persistently >140/90 or new symptoms notify office sooner

## 2023-03-31 NOTE — Patient Instructions (Addendum)
Thank you for coming to the office today.  Keep up with Endocrinology.  They can do A1c, Urine Microalbumin test and Foot Exam.  We will do other blood work today. Stay tuned for results.  Next Colonoscopy 2028, check with GI if you want to pursue it sooner  Keep on BP medications. Let me know if need refills.  Please schedule a Follow-up Appointment to: Return in about 1 year (around 03/30/2024) for 1 year Annual Physical AM early apt (fasting lab AFTER).  If you have any other questions or concerns, please feel free to call the office or send a message through MyChart. You may also schedule an earlier appointment if necessary.  Additionally, you may be receiving a survey about your experience at our office within a few days to 1 week by e-mail or mail. We value your feedback.  Saralyn Pilar, DO [facility] [place], [place]

## 2023-04-01 ENCOUNTER — Encounter: Payer: Self-pay | Admitting: Family Medicine

## 2023-04-01 LAB — TSH: TSH: 2.85 mIU/L (ref 0.40–4.50)

## 2023-04-01 LAB — LIPID PANEL
Cholesterol: 217 mg/dL — ABNORMAL HIGH (ref <200)
HDL: 47 mg/dL (ref >=40)
LDL Cholesterol (Calc): 128 mg/dL (calc) — ABNORMAL HIGH
Non-HDL Cholesterol (Calc): 170 mg/dL (calc) — ABNORMAL HIGH (ref <130)
Total CHOL/HDL Ratio: 4.6 (calc) (ref <5.0)
Triglycerides: 271 mg/dL — ABNORMAL HIGH (ref <150)

## 2023-04-01 LAB — COMPLETE METABOLIC PANEL WITH GFR
AG Ratio: 1.9 (calc) (ref 1.0–2.5)
ALT: 29 U/L (ref 9–46)
AST: 31 U/L (ref 10–40)
Albumin: 4.4 g/dL (ref 3.6–5.1)
Alkaline phosphatase (APISO): 33 U/L — ABNORMAL LOW (ref 36–130)
BUN: 16 mg/dL (ref 7–25)
CO2: 30 mmol/L (ref 20–32)
Calcium: 9.4 mg/dL (ref 8.6–10.3)
Chloride: 98 mmol/L (ref 98–110)
Creat: 0.99 mg/dL (ref 0.60–1.29)
Globulin: 2.3 g/dL (calc) (ref 1.9–3.7)
Glucose, Bld: 101 mg/dL — ABNORMAL HIGH (ref 65–99)
Potassium: 4.1 mmol/L (ref 3.5–5.3)
Sodium: 137 mmol/L (ref 135–146)
Total Bilirubin: 0.7 mg/dL (ref 0.2–1.2)
Total Protein: 6.7 g/dL (ref 6.1–8.1)
eGFR: 95 mL/min/{1.73_m2} (ref >=60)

## 2023-04-01 LAB — CBC WITH DIFFERENTIAL/PLATELET
Absolute Monocytes: 861 cells/uL (ref 200–950)
Basophils Absolute: 49 cells/uL (ref 0–200)
Basophils Relative: 0.6 %
Eosinophils Absolute: 172 cells/uL (ref 15–500)
Eosinophils Relative: 2.1 %
HCT: 52.2 % — ABNORMAL HIGH (ref 38.5–50.0)
Hemoglobin: 17.2 g/dL — ABNORMAL HIGH (ref 13.2–17.1)
Lymphs Abs: 2444 cells/uL (ref 850–3900)
MCH: 28.6 pg (ref 27.0–33.0)
MCHC: 33 g/dL (ref 32.0–36.0)
MCV: 86.7 fL (ref 80.0–100.0)
MPV: 9.4 fL (ref 7.5–12.5)
Monocytes Relative: 10.5 %
Neutro Abs: 4674 cells/uL (ref 1500–7800)
Neutrophils Relative %: 57 %
Platelets: 269 10*3/uL (ref 140–400)
RBC: 6.02 10*6/uL — ABNORMAL HIGH (ref 4.20–5.80)
RDW: 12.5 % (ref 11.0–15.0)
Total Lymphocyte: 29.8 %
WBC: 8.2 10*3/uL (ref 3.8–10.8)

## 2023-04-01 LAB — PSA: PSA: 0.52 ng/mL (ref <=4.00)

## 2023-04-01 MED ORDER — HYDROCHLOROTHIAZIDE 25 MG PO TABS: 25.0000 mg | ORAL_TABLET | Freq: Every day | ORAL | 1 refills | Status: DC

## 2023-04-01 MED ORDER — LOSARTAN POTASSIUM 100 MG PO TABS: 100.0000 mg | ORAL_TABLET | Freq: Every day | ORAL | 1 refills | Status: DC

## 2023-04-07 ENCOUNTER — Encounter: Payer: Self-pay | Admitting: Internal Medicine

## 2023-04-07 ENCOUNTER — Ambulatory Visit: Payer: BC Managed Care – PPO | Admitting: Internal Medicine

## 2023-04-07 VITALS — BP 120/80 | HR 79 | Ht 76.0 in | Wt 254.0 lb

## 2023-04-07 DIAGNOSIS — Z7985 Long-term (current) use of injectable non-insulin antidiabetic drugs: Secondary | ICD-10-CM | POA: Diagnosis not present

## 2023-04-07 DIAGNOSIS — E782 Mixed hyperlipidemia: Secondary | ICD-10-CM

## 2023-04-07 DIAGNOSIS — G6289 Other specified polyneuropathies: Secondary | ICD-10-CM

## 2023-04-07 DIAGNOSIS — E1142 Type 2 diabetes mellitus with diabetic polyneuropathy: Secondary | ICD-10-CM | POA: Diagnosis not present

## 2023-04-07 LAB — POCT GLYCOSYLATED HEMOGLOBIN (HGB A1C): Hemoglobin A1C: 5.8 % — AB (ref 4.0–5.6)

## 2023-04-07 NOTE — Progress Notes (Signed)
Patient ID: Tanner Frye, male   DOB: 04/09/1976, 47 y.o.   MRN: [id number]   HPI: Tanner Frye is a 47 y.o.-year-old male, initially referred by his PCP, Tanner Frye *, returning for follow-up for DM2, dx in 2021, controlled, with complications (peripheral neuropathy).  Last visit 6 months ago.  Interim hx: No increased urination, blurry vision, nausea, chest pain. He started [employer] and cordyceps >> work well for his neuropathy. He is working out-cardio with strength at home for approximately 1 hour 4 times a week.  He usually exercises after dinner.  Reviewed HbA1c: Lab Results  Component Value Date   HGBA1C 5.0 09/23/2022   HGBA1C 5.3 03/18/2022   HGBA1C 5.8 (A) 08/26/2021   HGBA1C 5.5 02/19/2021   HGBA1C 6.2 (A) 11/20/2020   HGBA1C 6.0 (A) 08/01/2020   HGBA1C 8.9 (H) 01/26/2020   HGBA1C 6.8 (H) 03/29/2019   HGBA1C 6.2 (H) 03/25/2018   He is on: - Ozempic 0.5 mg weekly - started 08/2021 >> 1 mg weekly >> stopped 3 mo b/c losing muscle and strength  He was on: - metformin 500 mg 2x a day with meals  Pt checks his sugars 0-1x a day: - am: 116-126 >> 115-120 >> 110s >> 80s-100 >> 100-120 - 2h after b'fast: n/c >> 130 >> n/c >> 139 >> n/c >> 130 >> n/c - before lunch: n/c >> 123 >> 90s >> n/c - 2h after lunch: n/c >> 101 >> n/c >> 130 >> n/c - before dinner: 88-107, 134 >> 90s >> 90-100 >> 90s >> less than 100 - 2h after dinner: n/c >> 105 >> 115-120 >> n/c - bedtime: n/c >> 96 >> n/c >> 90s-105 - nighttime: n/c Lowest sugar was 80s >> 90 >> 75; it is unclear if he has hypoglycemia awareness.  Highest sugar was 120s >> 139 >> 120.  Glucometer: Freestyle lite  Pt's meals are: - Breakfast: 2 sugar-free jello cups + 2 eggs + 2 pieces of bacon >> oatmeal - Lunch: prev. Fast food >> now home cooked: chicken + veggies - Dinner: same - Snacks: no desserts, apples, carrots, crackers. No sweet drinks, but water, unsweet tea. Used to drink OJ. He saw nutrition at  [facility] in the past -he felt that this was helpful.  - no CKD, last BUN/creatinine:  Lab Results  Component Value Date   BUN 16 03/31/2023   BUN 15 03/18/2022   CREATININE 0.99 03/31/2023   CREATININE 0.85 03/18/2022  On losartan 100.  -+ HL; last set of lipids: Lab Results  Component Value Date   CHOL 217 (H) 03/31/2023   HDL 47 03/31/2023   LDLCALC 128 (H) 03/31/2023   LDLDIRECT 130.0 03/18/2022   TRIG 271 (H) 03/31/2023   CHOLHDL 4.6 03/31/2023  Started pravastatin 20 mg daily 09/2022. Of note, he had a coronary calcium score of 0.  - last eye exam was 10/23/2022: No DR  - + numbness and tingling in his feet. No pain. Started mid 2021.  Last foot exam per neurology 12/17/2021.  At that time, it was considered that his neuropathy was related to diabetes. Since he only had numbness at that time, no treatment recommendation was made since none of the available medications help in the absence of pain. Alpha-lipoic acid twice a day Lyrica did not help significantly. Also, L-me-folate did not help.  Exercise helps and his symptoms are increased if he takes a break from exercising for more than 2 to 3 days.  Currently on Cayenne fruit >> helps.  Pt has FH of prediabetes in M.  He also has a history of transaminitis (increased ALT) and also HTN.  At last visit, we investigated his peripheral neuropathy-B6 vitamin was high, but normalized after stopping multivitamins: Office Visit on 02/19/2021  Component Date Value Ref Range Status   Vitamin B6 02/19/2021 15.0  2.1 - 21.7 ng/mL Final   Office Visit on 11/20/2020  Component Date Value Ref Range Status   TSH 11/20/2020 1.98  0.35 - 4.50 uIU/mL Final   Vitamin B-12 11/20/2020 537  211 - 911 pg/mL Final   Vitamin B6 11/20/2020 42.0* 2.1 - 21.7 ng/mL Final   Ferritin 11/20/2020 49.9  22.0 - 322.0 ng/mL Final   He is on testosterone supplementation by [facility] MD.  ROS: + see HPI  I reviewed pt's medications, allergies, PMH, social  hx, family hx, and changes were documented in the history of present illness. Otherwise, unchanged from my initial visit note.  Past Medical History:  Diagnosis Date   Allergy    Anal fissure    Diabetes mellitus without complication (HCC) 2021   Family history of colorectal cancer    GERD (gastroesophageal reflux disease)    Hypertension 2021   Mixed hyperlipidemia 09/23/2022   Personal history of colonic polyps    Type 2 diabetes, controlled, with peripheral neuropathy (HCC)    Past Surgical History:  Procedure Laterality Date   COLONOSCOPY WITH PROPOFOL N/A 04/27/2017   Procedure: COLONOSCOPY WITH PROPOFOL;  Surgeon: Wyline Mood, MD;  Location: [facility] ENDOSCOPY;  Service: Endoscopy;  Laterality: N/A;   COLONOSCOPY WITH PROPOFOL N/A 06/21/2020   Procedure: COLONOSCOPY WITH PROPOFOL;  Surgeon: Wyline Mood, MD;  Location: [facility] ENDOSCOPY;  Service: Gastroenterology;  Laterality: N/A;   COLONOSCOPY WITH PROPOFOL N/A 04/24/2022   Procedure: COLONOSCOPY WITH PROPOFOL;  Surgeon: Wyline Mood, MD;  Location: [facility] ENDOSCOPY;  Service: Gastroenterology;  Laterality: N/A;   HERNIA REPAIR  1980   Social History   Socioeconomic History   Marital status: Married    Spouse name: Not on file   Number of children: Not on file   Years of education: Vocational Degree   Highest education level: Associate degree: occupational, Scientist, product/process development, or vocational program  Occupational History   Occupation: Psychologist, sport and exercise  Tobacco Use   Smoking status: Never   Smokeless tobacco: Never  Vaping Use   Vaping Use: Never used  Substance and Sexual Activity   Alcohol use: Yes    Alcohol/week: 0.0 standard drinks of alcohol    Comment: Wine or beer 1 -2  once or twice a week.   Drug use: No   Sexual activity: Yes    Partners: Female    Birth control/protection: Surgical  Other Topics Concern   Not on file  Social History Narrative   Right Handed    Lives in a two story home   Social Determinants of Health    Financial Resource Strain: Not on file  Food Insecurity: Not on file  Transportation Needs: Not on file  Physical Activity: Not on file  Stress: Not on file  Social Connections: Not on file  Intimate Partner Violence: Not on file   Current Outpatient Medications on File Prior to Visit  Medication Sig Dispense Refill   Ascorbic Acid (VITAMIN C) 1000 MG tablet Take 1,000 mg by mouth daily.     Blood Glucose Monitoring Suppl (FREESTYLE LITE) DEVI Use to check blood sugar up to twice a day as advised. 1  each 0   EPINEPHrine (EPIPEN 2-PAK) 0.3 mg/0.3 mL IJ SOAJ injection Inject 0.3 mg into the muscle as needed for anaphylaxis. 1 each 1   FREESTYLE LITE test strip Use to check blood sugar up to twice a day as advised. 200 each 3   hydrochlorothiazide (HYDRODIURIL) 25 MG tablet Take 1 tablet (25 mg total) by mouth daily. 90 tablet 1   Lancets (FREESTYLE) lancets Use to check blood sugar up to twice a day as advised. 100 each 12   Loratadine 10 MG CAPS      losartan (COZAAR) 100 MG tablet Take 1 tablet (100 mg total) by mouth daily. 90 tablet 1   pravastatin (PRAVACHOL) 20 MG tablet Take 1 tablet (20 mg total) by mouth daily. 90 tablet 3   Semaglutide, 1 MG/DOSE, 4 MG/3ML SOPN Inject 1 mg as directed once a week. (Patient not taking: Reported on 03/31/2023) 9 mL 3   testosterone cypionate (DEPOTESTOSTERONE CYPIONATE) 200 MG/ML injection Inject 200 mg into the muscle once a week.     No current facility-administered medications on file prior to visit.   Allergies  Allergen Reactions   Bee Venom    Oatmeal Swelling   Other Swelling    Processed foods that are baked   Prednisone Other (See Comments)    Other reaction(s): Other (See Comments) Effects mood verbal per patient Other reaction(s): Other (See Comments) Effects mood verbal per patient Effects mood verbal per patient Other reaction(s): Other (See Comments) Effects mood verbal per patient Effects mood verbal per patient    Family History  Problem Relation Age of Onset   Hypertension Mother    Hyperlipidemia Mother    Rectal cancer Brother 27   Cancer Brother    Rectal cancer Paternal Aunt    Colon cancer Paternal Uncle    Prostate cancer Neg Hx    PE: BP 120/80   Pulse 79   Ht 6\' 4"  (1.93 m)   Wt 254 lb (115.2 kg)   SpO2 96%   BMI 30.92 kg/m  Wt Readings from Last 3 Encounters:  04/07/23 254 lb (115.2 kg)  03/31/23 256 lb 6.4 oz (116.3 kg)  09/23/22 239 lb 3.2 oz (108.5 kg)   Constitutional: Muscular, in NAD Eyes: EOMI, no exophthalmos ENT: no thyromegaly, no cervical lymphadenopathy Cardiovascular: RRR, No MRG Respiratory: CTA B Musculoskeletal: no deformities Skin: no rashes Neurological: no tremor with outstretched hands Diabetic Foot Exam - Simple   Simple Foot Form Diabetic Foot exam was performed with the following findings: Yes 04/07/2023  9:31 AM  Visual Inspection No deformities, no ulcerations, no other skin breakdown bilaterally: Yes Sensation Testing Intact to touch and monofilament testing bilaterally: Yes Pulse Check Posterior Tibialis and Dorsalis pulse intact bilaterally: Yes Comments    ASSESSMENT: 1. DM2, non-insulin-dependent, controlled, controlled, with complications - PN  2. HL  3.  Peripheral neuropathy  PLAN:  1. Patient with history of prediabetes, but diagnosis of type II diabetes in 2021.  At that time, HbA1c returned high, at 8.9%, but blood sugars improved afterwards and HbA1c was lower, at 5.0% at last visit -He continues on minimalistic regimen with weekly GLP-1 receptor agonist only.  She lost a significant amount of weight, 20 pounds, right after starting Ozempic.  Afterwards, insurance did not cover it without trying other medications.  He tried metformin but this did not work well for him so he is now back on Ozempic 1 mg weekly, tolerated well.   -At last visits, we discussed  about continuing Cozaar in the setting of hypertension (no  microalbuminuria). -At today's visit, sugars remain well-controlled, only slightly higher than before in the morning, after stopping Ozempic.  He is eating a great job with exercise.  For now, I did not suggest to add back any diabetic medication.  We did discuss about possibly using metformin if needed in the future - I suggested to:  Patient Instructions  Please continue off diabetic medications.  Please come back for a follow-up appointment in 6 months.  - we checked his HbA1c: 5.8% (higher) - advised to check sugars at different times of the day - 1x a day, rotating check times - advised for yearly eye exams >> he is UTD - return to clinic in 6 months  2. HL -Reviewed latest lipid panel from earlier this month: LDL still elevated, triglycerides high, HDL at goal: Lab Results  Component Value Date   CHOL 217 (H) 03/31/2023   HDL 47 03/31/2023   LDLCALC 128 (H) 03/31/2023   LDLDIRECT 130.0 03/18/2022   TRIG 271 (H) 03/31/2023   CHOLHDL 4.6 03/31/2023  -We started pravastatin 20 mg daily at last visit  3. Peripheral neuropathy -Developed around the time when his diabetes became uncontrolled but his diabetes control improved afterwards with persistent neuropathy symptoms. He described no sensation in the front part of his sole and increase sensitivity mid foot. -Of note, his brother also developed neuropathy, but after chemotherapy -Symptoms improved with exercising  -Usually he has numbness, but without discomfort -He tried gabapentin, alpha lipoid acid, L-methylfolate without any improvement in symptoms. -B6 level was high so we stopped his multivitamins but then normalized B6 level afterwards did not help with symptoms -B12 vitamin and ferritin levels were normal -No history of excess alcohol or toxic exposures. -He is now seen neurology-Dr. Allena Katz.His neuropathy was still deemed due to his diabetes despite the mild nature of this.  Since he only has numbness, only conservative  measures were recommended. -We also tried Lyrica but this did not work for him -As of now, he uses cayenne fruit, and also [employer] and Cordyceps.  These work well for him.  Tanner Pavlov, MD PhD [facility] Endocrinology

## 2023-04-07 NOTE — Patient Instructions (Addendum)
Please continue off diabetic medications.  Please come back for a follow-up appointment in 6 months.

## 2023-04-09 ENCOUNTER — Encounter: Payer: Self-pay | Admitting: Family Medicine

## 2023-04-16 NOTE — Addendum Note (Signed)
Addended by: Pollie Meyer on: 04/16/2023 05:05 PM   Modules accepted: Orders

## 2023-05-04 DIAGNOSIS — L570 Actinic keratosis: Secondary | ICD-10-CM | POA: Diagnosis not present

## 2023-05-18 ENCOUNTER — Encounter: Payer: Self-pay | Admitting: Family Medicine

## 2023-05-19 DIAGNOSIS — E291 Testicular hypofunction: Secondary | ICD-10-CM | POA: Diagnosis not present

## 2023-05-19 DIAGNOSIS — R5382 Chronic fatigue, unspecified: Secondary | ICD-10-CM | POA: Diagnosis not present

## 2023-05-19 DIAGNOSIS — Z79890 Hormone replacement therapy (postmenopausal): Secondary | ICD-10-CM | POA: Diagnosis not present

## 2023-05-21 DIAGNOSIS — R6882 Decreased libido: Secondary | ICD-10-CM | POA: Diagnosis not present

## 2023-05-21 DIAGNOSIS — R5382 Chronic fatigue, unspecified: Secondary | ICD-10-CM | POA: Diagnosis not present

## 2023-05-21 DIAGNOSIS — Z6830 Body mass index (BMI) 30.0-30.9, adult: Secondary | ICD-10-CM | POA: Diagnosis not present

## 2023-05-21 DIAGNOSIS — E291 Testicular hypofunction: Secondary | ICD-10-CM | POA: Diagnosis not present

## 2023-06-04 ENCOUNTER — Other Ambulatory Visit (HOSPITAL_COMMUNITY): Payer: Self-pay

## 2023-07-16 DIAGNOSIS — L570 Actinic keratosis: Secondary | ICD-10-CM | POA: Diagnosis not present

## 2023-08-02 ENCOUNTER — Encounter: Payer: Self-pay | Admitting: Family Medicine

## 2023-08-09 ENCOUNTER — Encounter: Payer: Self-pay | Admitting: Medical

## 2023-08-09 ENCOUNTER — Emergency Department
Admission: EM | Admit: 2023-08-09 | Discharge: 2023-08-09 | Disposition: A | Discharge status: Home / Self Care | Payer: BC Managed Care – PPO | Attending: Emergency Medicine | Admitting: Emergency Medicine

## 2023-08-09 ENCOUNTER — Ambulatory Visit: Payer: BC Managed Care – PPO | Attending: Medical | Admitting: Medical

## 2023-08-09 ENCOUNTER — Emergency Department: Payer: BC Managed Care – PPO

## 2023-08-09 ENCOUNTER — Other Ambulatory Visit: Payer: Self-pay

## 2023-08-09 VITALS — BP 126/78 | HR 81 | Ht 76.0 in | Wt 251.4 lb

## 2023-08-09 DIAGNOSIS — R110 Nausea: Secondary | ICD-10-CM | POA: Diagnosis not present

## 2023-08-09 DIAGNOSIS — I959 Hypotension, unspecified: Secondary | ICD-10-CM | POA: Insufficient documentation

## 2023-08-09 DIAGNOSIS — E1142 Type 2 diabetes mellitus with diabetic polyneuropathy: Secondary | ICD-10-CM | POA: Diagnosis not present

## 2023-08-09 DIAGNOSIS — E782 Mixed hyperlipidemia: Secondary | ICD-10-CM

## 2023-08-09 DIAGNOSIS — I10 Essential (primary) hypertension: Secondary | ICD-10-CM

## 2023-08-09 DIAGNOSIS — R630 Anorexia: Secondary | ICD-10-CM | POA: Diagnosis not present

## 2023-08-09 DIAGNOSIS — R5383 Other fatigue: Secondary | ICD-10-CM | POA: Insufficient documentation

## 2023-08-09 DIAGNOSIS — E119 Type 2 diabetes mellitus without complications: Secondary | ICD-10-CM | POA: Diagnosis not present

## 2023-08-09 DIAGNOSIS — R531 Weakness: Secondary | ICD-10-CM | POA: Diagnosis not present

## 2023-08-09 DIAGNOSIS — N3289 Other specified disorders of bladder: Secondary | ICD-10-CM | POA: Diagnosis not present

## 2023-08-09 DIAGNOSIS — E860 Dehydration: Secondary | ICD-10-CM | POA: Insufficient documentation

## 2023-08-09 DIAGNOSIS — R109 Unspecified abdominal pain: Secondary | ICD-10-CM | POA: Insufficient documentation

## 2023-08-09 DIAGNOSIS — R1013 Epigastric pain: Secondary | ICD-10-CM | POA: Diagnosis not present

## 2023-08-09 LAB — URINALYSIS, ROUTINE W REFLEX MICROSCOPIC
Bilirubin Urine: NEGATIVE
Glucose, UA: NEGATIVE mg/dL
Hgb urine dipstick: NEGATIVE
Ketones, ur: NEGATIVE mg/dL
Leukocytes,Ua: NEGATIVE
Nitrite: NEGATIVE
Protein, ur: NEGATIVE mg/dL
Specific Gravity, Urine: 1.016 (ref 1.005–1.030)
pH: 5 (ref 5.0–8.0)

## 2023-08-09 LAB — CBC
HCT: 51.9 % (ref 39.0–52.0)
Hemoglobin: 17.4 g/dL — ABNORMAL HIGH (ref 13.0–17.0)
MCH: 28.1 pg (ref 26.0–34.0)
MCHC: 33.5 g/dL (ref 30.0–36.0)
MCV: 83.8 fL (ref 80.0–100.0)
Platelets: 343 10*3/uL (ref 150–400)
RBC: 6.19 MIL/uL — ABNORMAL HIGH (ref 4.22–5.81)
RDW: 13.4 % (ref 11.5–15.5)
WBC: 12.1 10*3/uL — ABNORMAL HIGH (ref 4.0–10.5)
nRBC: 0 % (ref 0.0–0.2)

## 2023-08-09 LAB — COMPREHENSIVE METABOLIC PANEL
ALT: 29 U/L (ref 0–44)
AST: 31 U/L (ref 15–41)
Albumin: 4.7 g/dL (ref 3.5–5.0)
Alkaline Phosphatase: 35 U/L — ABNORMAL LOW (ref 38–126)
Anion gap: 12 (ref 5–15)
BUN: 15 mg/dL (ref 6–20)
CO2: 29 mmol/L (ref 22–32)
Calcium: 10 mg/dL (ref 8.9–10.3)
Chloride: 88 mmol/L — ABNORMAL LOW (ref 98–111)
Creatinine, Ser: 1.21 mg/dL (ref 0.61–1.24)
GFR, Estimated: >60 mL/min (ref >60)
Glucose, Bld: 119 mg/dL — ABNORMAL HIGH (ref 70–99)
Potassium: 3.8 mmol/L (ref 3.5–5.1)
Sodium: 129 mmol/L — ABNORMAL LOW (ref 135–145)
Total Bilirubin: 1.7 mg/dL — ABNORMAL HIGH (ref 0.3–1.2)
Total Protein: 8.1 g/dL (ref 6.5–8.1)

## 2023-08-09 LAB — CBG MONITORING, ED: Glucose-Capillary: 114 mg/dL — ABNORMAL HIGH (ref 70–99)

## 2023-08-09 LAB — LIPASE, BLOOD: Lipase: 45 U/L (ref 11–51)

## 2023-08-09 MED ORDER — MORPHINE SULFATE (PF) 4 MG/ML IV SOLN
4.0000 mg | Freq: Once | INTRAVENOUS | Status: AC
  Administered 2023-08-09: 4 mg via INTRAVENOUS
  Filled 2023-08-09: qty 1

## 2023-08-09 MED ORDER — DICYCLOMINE HCL 10 MG PO CAPS: 10.0000 mg | ORAL_CAPSULE | Freq: Three times a day (TID) | ORAL | 0 refills | Status: DC | PRN

## 2023-08-09 MED ORDER — SODIUM CHLORIDE 0.9 % IV BOLUS
1000.0000 mL | Freq: Once | INTRAVENOUS | Status: AC
  Administered 2023-08-09: 1000 mL via INTRAVENOUS

## 2023-08-09 MED ORDER — OXYCODONE-ACETAMINOPHEN 5-325 MG PO TABS
1.0000 | ORAL_TABLET | Freq: Once | ORAL | Status: AC
  Administered 2023-08-09: 1 via ORAL
  Filled 2023-08-09: qty 1

## 2023-08-09 MED ORDER — MORPHINE SULFATE (PF) 4 MG/ML IV SOLN
4.0000 mg | Freq: Once | INTRAVENOUS | Status: AC
Start: 2023-08-09 — End: 2023-08-09
  Administered 2023-08-09: 4 mg via INTRAVENOUS
  Filled 2023-08-09: qty 1

## 2023-08-09 MED ORDER — ONDANSETRON 4 MG PO TBDP
4.0000 mg | ORAL_TABLET | Freq: Once | ORAL | Status: AC
  Administered 2023-08-09: 4 mg via ORAL
  Filled 2023-08-09: qty 1

## 2023-08-09 MED ORDER — ONDANSETRON 4 MG PO TBDP: 4.0000 mg | ORAL_TABLET | Freq: Three times a day (TID) | ORAL | 0 refills | Status: DC | PRN

## 2023-08-09 MED ORDER — IOHEXOL 300 MG/ML  SOLN
100.0000 mL | Freq: Once | INTRAMUSCULAR | Status: AC | PRN
  Administered 2023-08-09: 100 mL via INTRAVENOUS

## 2023-08-09 MED ORDER — AMLODIPINE BESYLATE 5 MG PO TABS: 5.0000 mg | ORAL_TABLET | Freq: Every day | ORAL | 1 refills | Status: DC

## 2023-08-09 MED ORDER — DICYCLOMINE HCL 10 MG PO CAPS
10.0000 mg | ORAL_CAPSULE | Freq: Once | ORAL | Status: AC
  Administered 2023-08-09: 10 mg via ORAL
  Filled 2023-08-09: qty 1

## 2023-08-09 NOTE — ED Provider Triage Note (Signed)
Emergency Medicine Provider Triage Evaluation Note  GLADYS SCHAVER, a 47 y.o. male  was evaluated in triage.  Pt complains of abdominal pain and nausea. He would endorse symptoms since last week on Thursday, presumed to be food-related. He denies vomiting, diarrhea, or fevers.   Review of Systems  Positive: Abd pain Negative: FCS  Physical Exam  BP 131/60   Pulse 67   Temp 98.7 F (37.1 C)   Resp 18   SpO2 97%  Gen:   Awake, no distress  uncomfortable Resp:  Normal effort CTA MSK:   Moves extremities without difficulty  Other:  Soft nontender  Medical Decision Making  Medically screening exam initiated at 4:12 PM.  Appropriate orders placed.  Flo Shanks was informed that the remainder of the evaluation will be completed by another provider, this initial triage assessment does not replace that evaluation, and the importance of remaining in the ED until their evaluation is complete.  Patient to the ED for evaluation of epigastric abdominal pain, anorexia, and elevated BP.   Lissa Hoard, PA-C 08/09/23 1614

## 2023-08-09 NOTE — ED Notes (Signed)
ED Provider at bedside. 

## 2023-08-09 NOTE — ED Provider Notes (Signed)
[facility] Provider Note    Event Date/Time   First MD Initiated Contact with Patient 08/09/23 2035     (approximate)  History   Chief Complaint: Hypertension  HPI  Tanner Frye is a 47 y.o. male with a past medical history of diabetes, gastric reflux, hypertension, presents to the emergency department for weakness fatigue nausea decreased appetite.  According to the patient since Thursday he has been dealing with a "stomach bug."  Patient states he has been experiencing abdominal cramping and spasms as well as nausea.  Patient is not be able to eat or drink much at all since Thursday due to nausea.  Patient denies any vomiting.  Denies any diarrhea.  Patient does state a history of polycythemia for which he regularly donates blood but is not been doing so lately.  Patient denies any focal abdominal pain but does state fairly diffuse abdominal cramping.  Physical Exam   Triage Vital Signs: ED Triage Vitals [08/09/23 1536]  Encounter Vitals Group     BP 131/60     Systolic BP Percentile      Diastolic BP Percentile      Pulse Rate 67     Resp 18     Temp 98.7 F (37.1 C)     Temp src      SpO2 97 %     Weight      Height      Head Circumference      Peak Flow      Pain Score 10     Pain Loc      Pain Education      Exclude from Growth Chart     Most recent vital signs: Vitals:   08/09/23 1536  BP: 131/60  Pulse: 67  Resp: 18  Temp: 98.7 F (37.1 C)  SpO2: 97%    General: Awake, no distress.  CV:  Good peripheral perfusion.  Regular rate and rhythm  Resp:  Normal effort.  Equal breath sounds bilaterally.  Abd:  No distention.  Soft, nontender.  No rebound or guarding.  ED Results / Procedures / Treatments   EKG  EKG viewed and interpreted by myself shows a normal sinus rhythm 88 bpm with a narrow QRS, normal axis, normal intervals, nonspecific ST changes.  RADIOLOGY  I have reviewed and interpreted CT images.  There is no  obstruction or significant abnormality seen on my evaluation. Radiology is read the CT scan is negative for acute abnormality.   MEDICATIONS ORDERED IN ED: Medications  ondansetron (ZOFRAN-ODT) disintegrating tablet 4 mg (4 mg Oral Given 08/09/23 1638)  iohexol (OMNIPAQUE) 300 MG/ML solution 100 mL (100 mLs Intravenous Contrast Given 08/09/23 1708)  sodium chloride 0.9 % bolus 1,000 mL (0 mLs Intravenous Stopped 08/09/23 2034)  morphine (PF) 4 MG/ML injection 4 mg (4 mg Intravenous Given 08/09/23 1917)  sodium chloride 0.9 % bolus 1,000 mL (1,000 mLs Intravenous New Bag/Given 08/09/23 2105)  morphine (PF) 4 MG/ML injection 4 mg (4 mg Intravenous Given 08/09/23 2106)  dicyclomine (BENTYL) capsule 10 mg (10 mg Oral Given 08/09/23 2104)     IMPRESSION / MDM / ASSESSMENT AND PLAN / ED COURSE  I reviewed the triage vital signs and the nursing notes.  Patient's presentation is most consistent with acute presentation with potential threat to life or bodily function.  Patient presents the emergency department for abdominal cramping nausea weakness fatigue.  Patient states since Thursday has been nauseated feels like he has a  stomach bug although denies any vomiting or diarrhea.  Patient denies any focal abdominal pain but does state fairly diffuse abdominal cramping.  Patient states the discomfort is better after medication.  Patient also has received IV fluids.  Patient's lab work has resulted and showing mild hyponatremia sodium of 129 with a anion gap of 12 and hemoglobin of 17 likely indicating more dehydration than anything.  Urinalysis shows no concerning findings.  Patient CT scan shows no significant findings either.  We will hydrate with an additional liter of normal saline we will treat pain and place the patient on Bentyl for his abdominal spasms.  We will place the patient on Zofran for his nausea.  Patient agreeable to this plan of care and will follow-up with his doctor.  Will discharge  patient home after his second liter of fluids has finished.  FINAL CLINICAL IMPRESSION(S) / ED DIAGNOSES   Abdominal cramping Nausea Dehydration  Note:  This document was prepared using Dragon voice recognition software and may include unintentional dictation errors.   Minna Antis, MD 08/09/23 2116

## 2023-08-09 NOTE — ED Triage Notes (Signed)
Pt comes with c/o HTN and belly pain since Thursday. Pt states nausea and cramps maybe form food poisoning.  Pt was on BP meds but his numbers were good so he hasn't been taking bp meds. Pt is diabetic. Pt hasn't eaten since Thursday. Pt unable to keep anything down.

## 2023-08-09 NOTE — Patient Instructions (Signed)
Medication Instructions:  Your physician recommends the following medication changes.  START TAKING: Amlodipine 5 mg by mouth daily   *If you need a refill on your cardiac medications before your next appointment, please call your pharmacy*   Lab Work: No labs ordered today    Testing/Procedures: No test ordered today    Follow-Up: At [facility], you and your health needs are our priority.  As part of our continuing mission to provide you with exceptional heart care, we have created designated Provider Care Teams.  These Care Teams include your primary Cardiologist (physician) and Advanced Practice Providers (APPs -  Physician Assistants and Nurse Practitioners) who all work together to provide you with the care you need, when you need it.  We recommend signing up for the patient portal called "MyChart".  Sign up information is provided on this After Visit Summary.  MyChart is used to connect with patients for Virtual Visits (Telemedicine).  Patients are able to view lab/test results, encounter notes, upcoming appointments, etc.  Non-urgent messages can be sent to your provider as well.   To learn more about what you can do with MyChart, go to [email or web address].    Your next appointment:   1 month(s)  Provider:   Terrilee Croak, PA-C

## 2023-08-09 NOTE — Progress Notes (Signed)
Cardiology Office Note:    Date:  08/09/2023   ID:  Flo Shanks, DOB 01-15-1976, MRN [id number]  PCP:  Smitty Cords, DO  [facility] Cardiologist:  None  [facility] Electrophysiologist:  None   Referring MD: Saralyn Pilar *   Chief Complaint: Overdue follow-up  History of Present Illness:    Tanner Frye is a 47 y.o. male with a hx of hypertension, type 2 diabetes, GERD, obesity who is being seen for overdue follow-up.  Was evaluated by Dr. Kirke Corin in 2015 for elevated blood pressure, but did not require acute medication at that time. Patient had a coronary calcium score in 2019 which was normal at 0.  Patient was seen in 2022 via telehealth for hypertension.  Losartan was changed to losartan-hydrochlorothiazide.  Today, the patient reports elevated BP and headaches. The patient is undergoing testosterone infections. 2 weeks ago he noted elevated BP and headache, 174/101. He has been getting baby dose of testosterone since then. He is on losartan-hydrochlorothiazide but BP is still high. Then last week, he went to a restaurant and got food poisoning. He has been experience severe abdominal cramping and nausea. No vomiting or diarrhea. The patient denies chset pain, SOB. He has continual stomach pain. He denies lower leg edema, orthopnea, pnd, lightheadedness, dizziness, or palpitations. He has been taking Zofran and ibuprofen for the headache. BP today is normal.   Given patient's obvious uncomfortable appearance, it was recommended he consider going to the ER.   Past Medical History:  Diagnosis Date   Allergy    Anal fissure    Diabetes mellitus without complication (HCC) 2021   Family history of colorectal cancer    GERD (gastroesophageal reflux disease)    Hypertension 2021   Mixed hyperlipidemia 09/23/2022   Personal history of colonic polyps    Type 2 diabetes, controlled, with peripheral neuropathy (HCC)     Past Surgical History:  Procedure  Laterality Date   COLONOSCOPY WITH PROPOFOL N/A 04/27/2017   Procedure: COLONOSCOPY WITH PROPOFOL;  Surgeon: Wyline Mood, MD;  Location: [facility] ENDOSCOPY;  Service: Endoscopy;  Laterality: N/A;   COLONOSCOPY WITH PROPOFOL N/A 06/21/2020   Procedure: COLONOSCOPY WITH PROPOFOL;  Surgeon: Wyline Mood, MD;  Location: [facility] ENDOSCOPY;  Service: Gastroenterology;  Laterality: N/A;   COLONOSCOPY WITH PROPOFOL N/A 04/24/2022   Procedure: COLONOSCOPY WITH PROPOFOL;  Surgeon: Wyline Mood, MD;  Location: [facility] ENDOSCOPY;  Service: Gastroenterology;  Laterality: N/A;   HERNIA REPAIR  1980    Current Medications: Current Meds  Medication Sig   amLODipine (NORVASC) 5 MG tablet Take 1 tablet (5 mg total) by mouth daily.     Allergies:   Bee venom, Oatmeal, Other, and Prednisone   Social History   Socioeconomic History   Marital status: Married    Spouse name: Not on file   Number of children: Not on file   Years of education: Vocational Degree   Highest education level: Associate degree: occupational, Scientist, product/process development, or vocational program  Occupational History   Occupation: Psychologist, sport and exercise  Tobacco Use   Smoking status: Never   Smokeless tobacco: Never  Vaping Use   Vaping status: Never Used  Substance and Sexual Activity   Alcohol use: Yes    Alcohol/week: 0.0 standard drinks of alcohol    Comment: Wine or beer 1 -2  once or twice a week.   Drug use: No   Sexual activity: Yes    Partners: Female    Birth control/protection: Surgical  Other Topics  Concern   Not on file  Social History Narrative   Right Handed    Lives in a two story home   Social Determinants of Health   Financial Resource Strain: Not on file  Food Insecurity: Not on file  Transportation Needs: Not on file  Physical Activity: Not on file  Stress: Not on file  Social Connections: Not on file     Family History: The patient's family history includes Cancer in his brother; Colon cancer in his paternal uncle; Hyperlipidemia  in his mother; Hypertension in his mother; Rectal cancer in his paternal aunt; Rectal cancer (age of onset: 61) in his brother. There is no history of Prostate cancer.  ROS:   Please see the history of present illness.     All other systems reviewed and are negative.  EKGs/Labs/Other Studies Reviewed:    The following studies were reviewed today:  Coronary calcium score of 0 in 2019   EKG:  EKG is not ordered today.   Recent Labs: 03/31/2023: ALT 29; BUN 16; Creat 0.99; Hemoglobin 17.2; Platelets 269; Potassium 4.1; Sodium 137; TSH 2.85  Recent Lipid Panel    Component Value Date/Time   CHOL 217 (H) 03/31/2023 0934   CHOL 244 (H) 06/19/2014 1433   TRIG 271 (H) 03/31/2023 0934   HDL 47 03/31/2023 0934   HDL 42 06/19/2014 1433   CHOLHDL 4.6 03/31/2023 0934   VLDL 56.0 (H) 03/18/2022 0917   LDLCALC 128 (H) 03/31/2023 0934   LDLDIRECT 130.0 03/18/2022 0917    Physical Exam:    VS:  BP 126/78 (BP Location: Left Arm, Patient Position: Sitting, Cuff Size: Large)   Pulse 81   Ht 6\' 4"  (1.93 m)   Wt 251 lb 6.4 oz (114 kg)   SpO2 97%   BMI 30.60 kg/m     Wt Readings from Last 3 Encounters:  08/09/23 251 lb 6.4 oz (114 kg)  04/07/23 254 lb (115.2 kg)  03/31/23 256 lb 6.4 oz (116.3 kg)     GEN:  Well nourished, well developed in no acute distress HEENT: Normal NECK: No JVD; No carotid bruits LYMPHATICS: No lymphadenopathy CARDIAC: RRR, no murmurs, rubs, gallops RESPIRATORY:  Clear to auscultation without rales, wheezing or rhonchi  ABDOMEN: Soft, non-tender, non-distended MUSCULOSKELETAL:  No edema; No deformity  SKIN: Warm and dry NEUROLOGIC:  Alert and oriented x 3 PSYCHIATRIC:  Normal affect   ASSESSMENT:    1. Essential hypertension   2. Type 2 diabetes, controlled, with peripheral neuropathy (HCC)   3. Hyperlipidemia, mixed    PLAN:    In order of problems listed above:  HTN/headaches 2 weeks ago the patient had a severe headache and BP was elevated  174/101. He takes losartan-hydrochlorothiazide daily. He also undergoes weekly testosterone injections. They decreased the testosterone dose, but BP has still been high and he has had continual headache. Last week, he contracted food poisoning and he has not been able to eat anything. He reports severe abdominal cramps and nausea for the last 5 days, no diarrhea or vomiting. BP today is normal. They report in the afternoon SBP at home in the 150s. I will given amlodipine 5mg  to take in the afternoon. Given patient's obvious discomfort, I recommended he go to the ER, which he eventually agreed to. They will do blood work in the ER.   DM2 Most recent A1C 5.8, this is followed by PCP.   HLD LDL 128, TG 271, total chol 217, HDL 47. Continue pravastatin.  Disposition: Follow up in 1 month(s) with MD/APP    Signed, Hadeel Hillebrand Cordarrius Stall, PA-C  08/09/2023 3:23 PM    [facility]

## 2023-08-09 NOTE — ED Notes (Signed)
Wife approached nurse desk and stated pt felt lightheaded and sob. Pt taken to triage room and cBG checked and EKG performed. Vitlas rechecked and pt medicated by PA with zofran.

## 2023-08-09 NOTE — Discharge Instructions (Signed)
As we discussed please take your nausea medication as prescribed.  Please take your Bentyl as needed for abdominal spasms.  Please drink plenty of fluids and follow-up with your doctor in the next several days for recheck/reevaluation.  As we discussed please call the number provided for hematology to discuss your elevated red blood cell count.  Return to the emergency department for any symptom personally concerning to yourself.

## 2023-08-11 ENCOUNTER — Ambulatory Visit: Payer: BC Managed Care – PPO | Admitting: Family Medicine

## 2023-08-11 ENCOUNTER — Encounter: Payer: Self-pay | Admitting: Family Medicine

## 2023-08-11 ENCOUNTER — Ambulatory Visit: Payer: Self-pay | Admitting: *Deleted

## 2023-08-11 VITALS — BP 124/72 | HR 68 | Ht 76.0 in | Wt 254.0 lb

## 2023-08-11 DIAGNOSIS — R109 Unspecified abdominal pain: Secondary | ICD-10-CM | POA: Diagnosis not present

## 2023-08-11 DIAGNOSIS — K529 Noninfective gastroenteritis and colitis, unspecified: Secondary | ICD-10-CM

## 2023-08-11 MED ORDER — OXYCODONE-ACETAMINOPHEN 5-325 MG PO TABS: 1.0000 | ORAL_TABLET | Freq: Four times a day (QID) | ORAL | 0 refills | Status: AC | PRN

## 2023-08-11 MED ORDER — HYOSCYAMINE SULFATE 0.125 MG SL SUBL: 0.1250 mg | SUBLINGUAL_TABLET | SUBLINGUAL | 0 refills | Status: DC | PRN

## 2023-08-11 MED ORDER — CYCLOBENZAPRINE HCL 10 MG PO TABS: 5.0000 mg | ORAL_TABLET | Freq: Three times a day (TID) | ORAL | 0 refills | Status: DC | PRN

## 2023-08-11 MED ORDER — CIPROFLOXACIN HCL 500 MG PO TABS: 500.0000 mg | ORAL_TABLET | Freq: Two times a day (BID) | ORAL | 0 refills | Status: AC

## 2023-08-11 NOTE — Patient Instructions (Addendum)
Thank you for coming to the office today.  Unsure exact cause of your symptoms  We will try to treat symptoms, as It sounds like it could all be from original food poisoning episode  Switch from Dicyclomine over to Hyoscyamine sublingual med under tongue for abdominal cramp  Try IBGard OTC Peppermint Oil (Triple Coated Capsule) 180mg  take one 3 times daily to reduce diarrhea abdominal cramping  Start Cyclobenzapine (Flexeril) 10mg  tablets (muscle relaxant) - start with half (cut) to one whole pill at night for muscle relaxant - may make you sedated or sleepy (be careful driving or working on this) if tolerated you can take half to whole tab 2 to 3 times daily or every 8 hours as needed  Back up plan  Cipro antibiotic if worsening or new concerns for infection or colitis / diarrhea or travelers diarrhea  Percocet Oxycodone as needed for severe pain only back up plan.   Please schedule a Follow-up Appointment to: Return if symptoms worsen or fail to improve.  If you have any other questions or concerns, please feel free to call the office or send a message through MyChart. You may also schedule an earlier appointment if necessary.  Additionally, you may be receiving a survey about your experience at our office within a few days to 1 week by e-mail or mail. We value your feedback.  Saralyn Pilar, DO [facility], [place]

## 2023-08-11 NOTE — Progress Notes (Signed)
Subjective:    Patient ID: Tanner Frye, male    DOB: 1976-01-31, 47 y.o.   MRN: [id number]  Tanner Frye is a 47 y.o. male presenting on 08/11/2023 for Abdominal Pain   HPI  Discussed the use of AI scribe software for clinical note transcription with the patient, who gave verbal consent to proceed.     ED FOLLOW-UP VISIT  Hospital/Location: [facility] Date of ED Visit: 08/09/23  Reason for Presenting to ED: Acute GI Symptoms / Cramping / nausea vomiting   FOLLOW-UP  - ED provider note and record have been reviewed - Patient presents today about 2 days  after recent ED visit. Brief summary of recent course, patient had symptoms of suspected food poisoning onset Thursday last week 6 days ago, he experienced dehydration with nausea if eating and he reduced oral intake liquid or eating, did not have significant diarrhea or vomiting or losses, but had severe abdominal cramping worse if eating drinking and present even if not consuming. Presented to ED on 08/09/23, testing in ED with labs labs showed low Na 129, T Bili elevated 1.7, concern for dehydration, and WBC elevated as well at 12.1, urinalysis was negative, lipase 45 negative for pancreatitis, - He denies alcohol intake  - Today reports overall has done fairly well after discharge from ED. Symptoms of abdominal cramping pain, he is limited due to if he drinks more liquid and water or food. He had half bowl cream of wheat and banana, soft bland foods. Drinks sips of water, sprite, OJ, gatorade small amounts.  - New medications on discharge: Zofran ODT and Dicyclomine - Changes to current meds on discharge: none  I have reviewed the discharge medication list, and have reconciled the current and discharge medications today.      08/11/2023    2:10 PM 03/31/2023    9:05 AM 04/27/2022    9:23 AM  Depression screen PHQ 2/9  Decreased Interest 0 0 0  Down, Depressed, Hopeless 0 0 0  PHQ - 2 Score 0 0 0  Altered sleeping 0 0 0   Tired, decreased energy 0 0 0  Change in appetite 0 0 0  Feeling bad or failure about yourself  0 0 0  Trouble concentrating 0 0 0  Moving slowly or fidgety/restless 0 0 0  Suicidal thoughts 0 0 0  PHQ-9 Score 0 0 0  Difficult doing work/chores Not difficult at all Not difficult at all Not difficult at all    Social History   Tobacco Use   Smoking status: Never   Smokeless tobacco: Never  Vaping Use   Vaping status: Never Used  Substance Use Topics   Alcohol use: Yes    Alcohol/week: 0.0 standard drinks of alcohol    Comment: Wine or beer 1 -2  once or twice a week.   Drug use: No    Review of Systems Per HPI unless specifically indicated above     Objective:    BP 124/72   Pulse 68   Ht 6\' 4"  (1.93 m)   Wt 254 lb (115.2 kg)   SpO2 98%   BMI 30.92 kg/m   Wt Readings from Last 3 Encounters:  08/11/23 254 lb (115.2 kg)  08/09/23 251 lb 6.4 oz (114 kg)  04/07/23 254 lb (115.2 kg)    Physical Exam Vitals and nursing note reviewed.  Constitutional:      General: He is not in acute distress.    Appearance: He is well-developed.  He is not diaphoretic.     Comments: Well-appearing, comfortable, cooperative  HENT:     Head: Normocephalic and atraumatic.  Eyes:     General:        Right eye: No discharge.        Left eye: No discharge.     Conjunctiva/sclera: Conjunctivae normal.  Neck:     Thyroid: No thyromegaly.  Cardiovascular:     Rate and Rhythm: Normal rate and regular rhythm.     Pulses: Normal pulses.     Heart sounds: Normal heart sounds. No murmur heard. Pulmonary:     Effort: Pulmonary effort is normal. No respiratory distress.     Breath sounds: Normal breath sounds. No wheezing or rales.  Abdominal:     Palpations: Abdomen is soft. There is no mass.     Tenderness: There is no abdominal tenderness. There is no guarding.     Hernia: No hernia is present.     Comments: Hypoactive bowel sounds  Musculoskeletal:        General: Normal range of  motion.     Cervical back: Normal range of motion and neck supple.  Lymphadenopathy:     Cervical: No cervical adenopathy.  Skin:    General: Skin is warm and dry.     Findings: No erythema or rash.  Neurological:     Mental Status: He is alert and oriented to person, place, and time. Mental status is at baseline.  Psychiatric:        Behavior: Behavior normal.     Comments: Well groomed, good eye contact, normal speech and thoughts     I have personally reviewed the radiology report from 08/09/23 on CT Abd Pelvis.   Study Result CLINICAL DATA: Acute abdominal pain and nausea for several days  EXAM: CT ABDOMEN AND PELVIS WITH CONTRAST  TECHNIQUE: Multidetector CT imaging of the abdomen and pelvis was performed using the standard protocol following bolus administration of intravenous contrast.  RADIATION DOSE REDUCTION: This exam was performed according to the departmental dose-optimization program which includes automated exposure control, adjustment of the mA and/or kV according to patient size and/or use of iterative reconstruction technique.  CONTRAST: OMNIPAQUE IOHEXOL 300 MG/ML SOLN  COMPARISON: None Available.  FINDINGS: Lower chest: No acute abnormality.  Hepatobiliary: No focal liver abnormality is seen. No gallstones, gallbladder wall thickening, or biliary dilatation.  Pancreas: Unremarkable. No pancreatic ductal dilatation or surrounding inflammatory changes.  Spleen: Normal in size without focal abnormality.  Adrenals/Urinary Tract: Adrenal glands are within normal limits. Kidneys demonstrate a normal enhancement pattern bilaterally. No renal calculi or obstructive changes are seen. The bladder is partially distended.  Stomach/Bowel: The appendix is within normal limits. No obstructive or inflammatory changes of colon are seen. Small bowel and stomach are within normal limits.  Vascular/Lymphatic: No significant vascular findings are  present. No enlarged abdominal or pelvic lymph nodes.  Reproductive: Prostate is unremarkable.  Other: No abdominal wall hernia or abnormality. No abdominopelvic ascites.  Musculoskeletal: No acute or significant osseous findings.  IMPRESSION: No acute abnormality noted to correspond with the given clinical history.   Electronically Signed By: Alcide Clever M.D. On: 08/09/2023 20:23  Results for orders placed or performed during the hospital encounter of 08/09/23  Lipase, blood  Result Value Ref Range   Lipase 45 11 - 51 U/L  Comprehensive metabolic panel  Result Value Ref Range   Sodium 129 (L) 135 - 145 mmol/L   Potassium 3.8 3.5 - 5.1  mmol/L   Chloride 88 (L) 98 - 111 mmol/L   CO2 29 22 - 32 mmol/L   Glucose, Bld 119 (H) 70 - 99 mg/dL   BUN 15 6 - 20 mg/dL   Creatinine, Ser 7.82 0.61 - 1.24 mg/dL   Calcium 95.6 8.9 - 21.3 mg/dL   Total Protein 8.1 6.5 - 8.1 g/dL   Albumin 4.7 3.5 - 5.0 g/dL   AST 31 15 - 41 U/L   ALT 29 0 - 44 U/L   Alkaline Phosphatase 35 (L) 38 - 126 U/L   Total Bilirubin 1.7 (H) 0.3 - 1.2 mg/dL   GFR, Estimated >08 >65 mL/min   Anion gap 12 5 - 15  CBC  Result Value Ref Range   WBC 12.1 (H) 4.0 - 10.5 K/uL   RBC 6.19 (H) 4.22 - 5.81 MIL/uL   Hemoglobin 17.4 (H) 13.0 - 17.0 g/dL   HCT 78.4 69.6 - 29.5 %   MCV 83.8 80.0 - 100.0 fL   MCH 28.1 26.0 - 34.0 pg   MCHC 33.5 30.0 - 36.0 g/dL   RDW 28.4 13.2 - 44.0 %   Platelets 343 150 - 400 K/uL   nRBC 0.0 0.0 - 0.2 %  Urinalysis, Routine w reflex microscopic -Urine, Random  Result Value Ref Range   Color, Urine YELLOW (A) YELLOW   APPearance CLEAR (A) CLEAR   Specific Gravity, Urine 1.016 1.005 - 1.030   pH 5.0 5.0 - 8.0   Glucose, UA NEGATIVE NEGATIVE mg/dL   Hgb urine dipstick NEGATIVE NEGATIVE   Bilirubin Urine NEGATIVE NEGATIVE   Ketones, ur NEGATIVE NEGATIVE mg/dL   Protein, ur NEGATIVE NEGATIVE mg/dL   Nitrite NEGATIVE NEGATIVE   Leukocytes,Ua NEGATIVE NEGATIVE  CBG monitoring, ED   Result Value Ref Range   Glucose-Capillary 114 (H) 70 - 99 mg/dL   Comment 1 Notify RN    Comment 2 Document in Chart       Assessment & Plan:   Problem List Items Addressed This Visit   None Visit Diagnoses     Abdominal cramping    -  Primary   Relevant Medications   hyoscyamine (LEVSIN SL) 0.125 MG SL tablet   cyclobenzaprine (FLEXERIL) 10 MG tablet   oxyCODONE-acetaminophen (PERCOCET/ROXICET) 5-325 MG tablet   Colitis       Relevant Medications   ciprofloxacin (CIPRO) 500 MG tablet   oxyCODONE-acetaminophen (PERCOCET/ROXICET) 5-325 MG tablet       Assessment and Plan    Abdominal Cramping and Dehydration Recent onset of severe abdominal cramping, decreased oral intake, and dehydration. No significant findings on recent CT scan, labs, or EKG. Current treatment with Zofran and Dicyclomine providing limited relief.  -Stop Dicyclomine -Start Hyoscyamine for abdominal cramping.  -Start Flexeril as a muscle relaxant.  -Consider use of over-the-counter peppermint oil capsules (IB guard).  -Provide Percocet for pain control for severe breakthrough pain only., use with caution  -Provide Ciprofloxacin as a backup plan for potential colitis or traveler's diarrhea during upcoming trip.  -Encourage gradual reintroduction of bland, easy-to-digest foods and fluids. -Consider GI consult if symptoms persist.  Hypertension Discussed ongoing management with cardiologist. -Continue current management plan.        Meds ordered this encounter  Medications   hyoscyamine (LEVSIN SL) 0.125 MG SL tablet    Sig: Place 1 tablet (0.125 mg total) under the tongue every 4 (four) hours as needed for cramping.    Dispense:  30 tablet    Refill:  0  cyclobenzaprine (FLEXERIL) 10 MG tablet    Sig: Take 0.5-1 tablets (5-10 mg total) by mouth 3 (three) times daily as needed for muscle spasms.    Dispense:  30 tablet    Refill:  0   ciprofloxacin (CIPRO) 500 MG tablet    Sig: Take 1  tablet (500 mg total) by mouth 2 (two) times daily for 7 days.    Dispense:  14 tablet    Refill:  0   oxyCODONE-acetaminophen (PERCOCET/ROXICET) 5-325 MG tablet    Sig: Take 1 tablet by mouth every 6 (six) hours as needed for up to 5 days.    Dispense:  20 tablet    Refill:  0      Follow up plan: Return if symptoms worsen or fail to improve.    Saralyn Pilar, DO [facility] 08/11/2023, 2:05 PM

## 2023-08-11 NOTE — Telephone Encounter (Signed)
  Chief Complaint: abdominal cramping not getting better dx Monday "food poisoning" Symptoms: abdominal cramping "all over" per patient wife on DPR not with patient now. Not tolerating any food causes cramping.  Frequency: last Thursday  Pertinent Negatives: Patient denies fever. No N/V. No diarrhea Disposition: [] ED /[] Urgent Care (no appt availability in office) / [x] Appointment(In office/virtual)/ []  [facility]/ [] Home Care/ [] Refused Recommended Disposition /[] [facility]/ []  Follow-up with PCP Additional Notes:   Appt scheduled today .    Reason for Disposition  [1] MODERATE pain (e.g., interferes with normal activities) AND [2] pain comes and goes (cramps) AND [3] present > 24 hours  (Exception: Pain with Vomiting or Diarrhea - see that Guideline.)  Answer Assessment - Initial Assessment Questions 1. LOCATION: "Where does it hurt?"      All abdominal area per patient wife not with patient now  2. RADIATION: "Does the pain shoot anywhere else?" (e.g., chest, back)     No  3. ONSET: "When did the pain begin?" (Minutes, hours or days ago)      Thursday 4. SUDDEN: "Gradual or sudden onset?"     Na  5. PATTERN "Does the pain come and go, or is it constant?"    - If it comes and goes: "How long does it last?" "Do you have pain now?"     (Note: Comes and goes means the pain is intermittent. It goes away completely between bouts.)    - If constant: "Is it getting better, staying the same, or getting worse?"      (Note: Constant means the pain never goes away completely; most serious pain is constant and gets worse.)      Comes and goes  6. SEVERITY: "How bad is the pain?"  (e.g., Scale 1-10; mild, moderate, or severe)    - MILD (1-3): Doesn't interfere with normal activities, abdomen soft and not tender to touch.     - MODERATE (4-7): Interferes with normal activities or awakens from sleep, abdomen tender to touch.     - SEVERE (8-10): Excruciating pain, doubled  over, unable to do any normal activities.       Starting to interfere with normal activities  7. RECURRENT SYMPTOM: "Have you ever had this type of stomach pain before?" If Yes, ask: "When was the last time?" and "What happened that time?"      Yes last seen in ED Monday treated for "food poisoning" 8. CAUSE: "What do you think is causing the stomach pain?"     "Food poisoning  9. RELIEVING/AGGRAVATING FACTORS: "What makes it better or worse?" (e.g., antacids, bending or twisting motion, bowel movement)     na 10. OTHER SYMPTOMS: "Do you have any other symptoms?" (e.g., back pain, diarrhea, fever, urination pain, vomiting)       Abdominal cramping after eating any food  Protocols used: Abdominal Pain - Male-A-AH

## 2023-08-17 DIAGNOSIS — Z79890 Hormone replacement therapy (postmenopausal): Secondary | ICD-10-CM | POA: Diagnosis not present

## 2023-08-17 DIAGNOSIS — R5382 Chronic fatigue, unspecified: Secondary | ICD-10-CM | POA: Diagnosis not present

## 2023-08-17 DIAGNOSIS — E291 Testicular hypofunction: Secondary | ICD-10-CM | POA: Diagnosis not present

## 2023-08-19 DIAGNOSIS — Z6829 Body mass index (BMI) 29.0-29.9, adult: Secondary | ICD-10-CM | POA: Diagnosis not present

## 2023-08-19 DIAGNOSIS — R5382 Chronic fatigue, unspecified: Secondary | ICD-10-CM | POA: Diagnosis not present

## 2023-08-19 DIAGNOSIS — E291 Testicular hypofunction: Secondary | ICD-10-CM | POA: Diagnosis not present

## 2023-08-19 DIAGNOSIS — R6882 Decreased libido: Secondary | ICD-10-CM | POA: Diagnosis not present

## 2023-08-24 DIAGNOSIS — H35033 Hypertensive retinopathy, bilateral: Secondary | ICD-10-CM | POA: Diagnosis not present

## 2023-08-24 DIAGNOSIS — E119 Type 2 diabetes mellitus without complications: Secondary | ICD-10-CM | POA: Diagnosis not present

## 2023-08-24 DIAGNOSIS — I10 Essential (primary) hypertension: Secondary | ICD-10-CM | POA: Diagnosis not present

## 2023-08-24 DIAGNOSIS — H11153 Pinguecula, bilateral: Secondary | ICD-10-CM | POA: Diagnosis not present

## 2023-09-02 LAB — HM DIABETES EYE EXAM

## 2023-09-13 DIAGNOSIS — Z79890 Hormone replacement therapy (postmenopausal): Secondary | ICD-10-CM | POA: Diagnosis not present

## 2023-09-13 DIAGNOSIS — E291 Testicular hypofunction: Secondary | ICD-10-CM | POA: Diagnosis not present

## 2023-09-15 ENCOUNTER — Encounter: Payer: Self-pay | Admitting: Family Medicine

## 2023-09-15 DIAGNOSIS — R5382 Chronic fatigue, unspecified: Secondary | ICD-10-CM | POA: Diagnosis not present

## 2023-09-15 DIAGNOSIS — E291 Testicular hypofunction: Secondary | ICD-10-CM | POA: Diagnosis not present

## 2023-09-15 DIAGNOSIS — R6882 Decreased libido: Secondary | ICD-10-CM | POA: Diagnosis not present

## 2023-09-22 NOTE — Progress Notes (Unsigned)
Cardiology Office Note    Date:  09/23/2023   ID:  Tanner Frye, DOB 24-Jul-1976, MRN [id number]  PCP:  Smitty Cords, DO  Cardiologist:  Lorine Bears, MD  Electrophysiologist:  None   Chief Complaint: Follow-up  History of Present Illness:   Tanner Frye is a 47 y.o. male with history of HTN, DM2 diagnosed in 2021, obesity, and GERD who presents for follow-up of hypertension.  Calcium score in 2016 of 0 with scattered small bilateral pulmonary nodules measuring up to 7.4 mm in the right medial lobe noted.  Repeat calcium score in 07/2018 of 0 with stable scattered pulmonary nodules with no additional follow-up indicated.  He was most recently seen in the office on 08/09/2023 and reported elevated BP readings and headaches.  He reported a BP of 174/101.  He indicated he was undergoing testosterone replacement therapy.  He was without symptoms of angina or cardiac decompensation.  He did note some GI upset after eating at a restaurant.  Blood pressure was well-controlled in the office at 126/78.  In the setting of ongoing severe abdominal cramping and nausea he was transferred to the ED where BP remained well-controlled at 131/60.  Labs were notable for hyponatremia with a sodium of 129, chloride 88, WBC 12.1, Hgb 17.4, and normal lipase.  CT of the abdomen/pelvis that showed no acute abnormality.  Symptoms improved with IV fluids with recommendation to take Bentyl and Zofran.  He followed up with PCP 2 days later with well-controlled blood pressure and recommendation to start hyoscyamine.  He comes in accompanied by his wife today and is doing well from a cardiac perspective, without symptoms of angina or cardiac decompensation.  No palpitations, presyncope, or syncope.  He does continue to note posterior neck/vertebral headache that has been fairly constant since approximately September 2024.  Headache is worse with exertion.  No vision changes.  Blood pressure has been  well-controlled at home.  At times, headache has been associated with some dizziness.  Continues to undergo therapeutic phlebotomy with hemogram with cytosis associated with testosterone injections.  Tolerating amlodipine, losartan, and pravastatin.  Requests calcium score today.   Labs independently reviewed: 07/2023 - Hgb 17.4, PLT 343, potassium 3.8, BUN 15, serum creatinine 1.21, albumin 4.7, AST/ALT normal 03/2023 - A1c 5.8, TSH normal, TC 217, TG 271, HDL 47, LDL 128  Past Medical History:  Diagnosis Date   Allergy    Anal fissure    Diabetes mellitus without complication (HCC) 2021   Family history of colorectal cancer    GERD (gastroesophageal reflux disease)    Hypertension 2021   Mixed hyperlipidemia 09/23/2022   Personal history of colonic polyps    Type 2 diabetes, controlled, with peripheral neuropathy (HCC)     Past Surgical History:  Procedure Laterality Date   COLONOSCOPY WITH PROPOFOL N/A 04/27/2017   Procedure: COLONOSCOPY WITH PROPOFOL;  Surgeon: Wyline Mood, MD;  Location: [facility] ENDOSCOPY;  Service: Endoscopy;  Laterality: N/A;   COLONOSCOPY WITH PROPOFOL N/A 06/21/2020   Procedure: COLONOSCOPY WITH PROPOFOL;  Surgeon: Wyline Mood, MD;  Location: [facility] ENDOSCOPY;  Service: Gastroenterology;  Laterality: N/A;   COLONOSCOPY WITH PROPOFOL N/A 04/24/2022   Procedure: COLONOSCOPY WITH PROPOFOL;  Surgeon: Wyline Mood, MD;  Location: [facility] ENDOSCOPY;  Service: Gastroenterology;  Laterality: N/A;   HERNIA REPAIR  1980    Current Medications: Current Meds  Medication Sig   amLODipine (NORVASC) 5 MG tablet Take 1 tablet (5 mg total) by mouth daily.  Ascorbic Acid (VITAMIN C) 1000 MG tablet Take 1,000 mg by mouth daily.   Blood Glucose Monitoring Suppl (FREESTYLE LITE) DEVI Use to check blood sugar up to twice a day as advised.   EPINEPHrine (EPIPEN 2-PAK) 0.3 mg/0.3 mL IJ SOAJ injection Inject 0.3 mg into the muscle as needed for anaphylaxis.   FREESTYLE LITE test strip Use to  check blood sugar up to twice a day as advised.   hydrochlorothiazide (HYDRODIURIL) 25 MG tablet Take 1 tablet (25 mg total) by mouth daily.   Lancets (FREESTYLE) lancets Use to check blood sugar up to twice a day as advised.   Loratadine 10 MG CAPS    losartan (COZAAR) 100 MG tablet Take 1 tablet (100 mg total) by mouth daily.   Multiple Vitamins-Minerals (MULTIVIT/MULTIMINERAL ADULT PO)    pravastatin (PRAVACHOL) 20 MG tablet Take 1 tablet (20 mg total) by mouth daily.   testosterone cypionate (DEPOTESTOSTERONE CYPIONATE) 200 MG/ML injection Inject 200 mg into the muscle once a week.    Allergies:   Bee venom, Oatmeal, Other, and Prednisone   Social History   Socioeconomic History   Marital status: Married    Spouse name: Not on file   Number of children: Not on file   Years of education: Vocational Degree   Highest education level: Associate degree: occupational, Scientist, product/process development, or vocational program  Occupational History   Occupation: Psychologist, sport and exercise  Tobacco Use   Smoking status: Never   Smokeless tobacco: Never  Vaping Use   Vaping status: Never Used  Substance and Sexual Activity   Alcohol use: Yes    Alcohol/week: 0.0 standard drinks of alcohol    Comment: Wine or beer 1 -2  once or twice a week.   Drug use: No   Sexual activity: Yes    Partners: Female    Birth control/protection: Surgical  Other Topics Concern   Not on file  Social History Narrative   Right Handed    Lives in a two story home   Social Determinants of Health   Financial Resource Strain: Not on file  Food Insecurity: Not on file  Transportation Needs: Not on file  Physical Activity: Not on file  Stress: Not on file  Social Connections: Not on file     Family History:  The patient's family history includes Cancer in his brother; Colon cancer in his paternal uncle; Hyperlipidemia in his mother; Hypertension in his mother; Rectal cancer in his paternal aunt; Rectal cancer (age of onset: 20) in his  brother. There is no history of Prostate cancer.  ROS:   12-point review of systems is negative unless otherwise noted in the HPI.   EKGs/Labs/Other Studies Reviewed:    Studies reviewed were summarized above. The additional studies were reviewed today:  Calcium score 10/08/2015: FINDINGS: Non-cardiac: Findings on limited lung and soft tissue windows. See separate report from [facility]. Ascending Aorta:  3.0 cm Pericardium: Normal Coronary arteries:  No calcium detected IMPRESSION: 1) Coronary calcium score of 0. 2) See radiology note regarding lung nodules __________  Calcium score 08/01/2018: FINDINGS: Non-cardiac: See separate report from [facility]. Ascending Aorta: Normal diameter 3.3 cm Pericardium: Normal Coronary arteries: No calcium detected IMPRESSION: Coronary calcium score of 0.   Noncardiac overread: IMPRESSION: Small scattered pulmonary nodules, stable since 2016 compatible with benign nodules. No additional follow-up necessary.   Fatty infiltration of the liver.    EKG:  EKG is not ordered today.    Recent Labs: 03/31/2023: TSH 2.85 08/09/2023: ALT  29; BUN 15; Creatinine, Ser 1.21; Hemoglobin 17.4; Platelets 343; Potassium 3.8; Sodium 129  Recent Lipid Panel    Component Value Date/Time   CHOL 217 (H) 03/31/2023 0934   CHOL 244 (H) 06/19/2014 1433   TRIG 271 (H) 03/31/2023 0934   HDL 47 03/31/2023 0934   HDL 42 06/19/2014 1433   CHOLHDL 4.6 03/31/2023 0934   VLDL 56.0 (H) 03/18/2022 0917   LDLCALC 128 (H) 03/31/2023 0934   LDLDIRECT 130.0 03/18/2022 0917    PHYSICAL EXAM:    VS:  BP 123/80 (BP Location: Left Arm, Patient Position: Sitting, Cuff Size: Normal)   Pulse 86   Ht 6\' 4"  (1.93 m)   Wt 257 lb 3.2 oz (116.7 kg)   SpO2 96%   BMI 31.31 kg/m   BMI: Body mass index is 31.31 kg/m.  Physical Exam Vitals reviewed.  Constitutional:      Appearance: He is well-developed.  HENT:     Head: Normocephalic and  atraumatic.  Eyes:     General:        Right eye: No discharge.        Left eye: No discharge.  Neck:     Vascular: No JVD.  Cardiovascular:     Rate and Rhythm: Normal rate and regular rhythm.     Heart sounds: Normal heart sounds, S1 normal and S2 normal. Heart sounds not distant. No midsystolic click and no opening snap. No murmur heard.    No friction rub.  Pulmonary:     Effort: Pulmonary effort is normal. No respiratory distress.     Breath sounds: Normal breath sounds. No decreased breath sounds, wheezing, rhonchi or rales.  Chest:     Chest wall: No tenderness.  Abdominal:     General: There is no distension.  Musculoskeletal:     Cervical back: Normal range of motion.     Right lower leg: No edema.     Left lower leg: No edema.  Skin:    General: Skin is warm and dry.     Nails: There is no clubbing.  Neurological:     Mental Status: He is alert and oriented to person, place, and time.  Psychiatric:        Speech: Speech normal.        Behavior: Behavior normal.        Thought Content: Thought content normal.        Judgment: Judgment normal.     Wt Readings from Last 3 Encounters:  09/23/23 257 lb 3.2 oz (116.7 kg)  08/11/23 254 lb (115.2 kg)  08/09/23 251 lb 6.4 oz (114 kg)     ASSESSMENT & PLAN:   HTN: Blood pressure is well-controlled in the office.  Continue amlodipine 5 mg, hydrochlorothiazide 25 mg, and losartan 100 mg.  HLD: LDL 128 in 03/2023.  Obtain fasting lipid and liver function.  Obtain coronary calcium score for further risk stratification.  May need to consider escalation of lipid therapy based on results.  Remains on pravastatin 20 mg.  DM2: Followed by endocrinology.  Vertebral headache: Query if this is in the setting of hemochromatosis with testosterone injections.  I do not see a clear cardiac component.  Schedule MRI of the brain.  May need to consider CTA head/neck along with neurology referral.  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  Currently undergoing therapeutic blood donations.  Ongoing management per PCP.    Disposition: F/u with Dr.  Arida or an APP in 3 months.   Medication Adjustments/Labs and Tests Ordered: Current medicines are reviewed at length with the patient today.  Concerns regarding medicines are outlined above. Medication changes, Labs and Tests ordered today are summarized above and listed in the Patient Instructions accessible in Encounters.   Signed, Eula Listen, PA-C 09/23/2023 10:21 AM     [facility] [address] [place], [place] [address] [phone]

## 2023-09-23 ENCOUNTER — Encounter: Payer: Self-pay | Admitting: Physician Assistant

## 2023-09-23 ENCOUNTER — Ambulatory Visit: Payer: BC Managed Care – PPO | Attending: Physician Assistant | Admitting: Physician Assistant

## 2023-09-23 VITALS — BP 123/80 | HR 86 | Ht 76.0 in | Wt 257.2 lb

## 2023-09-23 DIAGNOSIS — I10 Essential (primary) hypertension: Secondary | ICD-10-CM

## 2023-09-23 DIAGNOSIS — Z79899 Other long term (current) drug therapy: Secondary | ICD-10-CM | POA: Diagnosis not present

## 2023-09-23 DIAGNOSIS — E785 Hyperlipidemia, unspecified: Secondary | ICD-10-CM

## 2023-09-23 DIAGNOSIS — G44221 Chronic tension-type headache, intractable: Secondary | ICD-10-CM | POA: Diagnosis not present

## 2023-09-23 NOTE — Patient Instructions (Addendum)
Medication Instructions:  Your Physician recommend you continue on your current medication as directed.    *If you need a refill on your cardiac medications before your next appointment, please call your pharmacy*   Lab Work: Your provider would like for you to have following labs drawn today Lipid panel and LFT.   If you have labs (blood work) drawn today and your tests are completely normal, you will receive your results only by: MyChart Message (if you have MyChart) OR A paper copy in the mail If you have any lab test that is abnormal or we need to change your treatment, we will call you to review the results.   Testing/Procedures: MRI examination of the brain has been ordered for you and you will be contacted to have this scheduled at the [facility] medical mall.   CT coronary calcium score.   - $99 out of pocket cost at the time of your test - Call [phone] to schedule at your convenience.  [facility] [address] [place], [place] [address]   Coronary CalciumScan A coronary calcium scan is an imaging test used to look for deposits of calcium and other fatty materials (plaques) in the inner lining of the blood vessels of the heart (coronary arteries). These deposits of calcium and plaques can partly clog and narrow the coronary arteries without producing any symptoms or warning signs. This puts a person at risk for a heart attack. This test can detect these deposits before symptoms develop. Tell a health care provider about: Any allergies you have. All medicines you are taking, including vitamins, herbs, eye drops, creams, and over-the-counter medicines. Any problems you or family members have had with anesthetic medicines. Any blood disorders you have. Any surgeries you have had. Any medical conditions you have. Whether you are pregnant or may be pregnant. What are the risks? Generally, this is a safe procedure. However, problems  may occur, including: Harm to a pregnant woman and her unborn baby. This test involves the use of radiation. Radiation exposure can be dangerous to a pregnant woman and her unborn baby. If you are pregnant, you generally should not have this procedure done. Slight increase in the risk of cancer. This is because of the radiation involved in the test. What happens before the procedure? No preparation is needed for this procedure. What happens during the procedure? You will undress and remove any jewelry around your neck or chest. You will put on a hospital gown. Sticky electrodes will be placed on your chest. The electrodes will be connected to an electrocardiogram (ECG) machine to record a tracing of the electrical activity of your heart. A CT scanner will take pictures of your heart. During this time, you will be asked to lie still and hold your breath for 2-3 seconds while a picture of your heart is being taken. The procedure may vary among health care providers and hospitals. What happens after the procedure? You can get dressed. You can return to your normal activities. It is up to you to get the results of your test. Ask your health care provider, or the department that is doing the test, when your results will be ready. Summary A coronary calcium scan is an imaging test used to look for deposits of calcium and other fatty materials (plaques) in the inner lining of the blood vessels of the heart (coronary arteries). Generally, this is a safe procedure. Tell your health care provider if you are pregnant or  may be pregnant. No preparation is needed for this procedure. A CT scanner will take pictures of your heart. You can return to your normal activities after the scan is done. This information is not intended to replace advice given to you by your health care provider. Make sure you discuss any questions you have with your health care provider. Document Released: 04/02/2008 Document Revised:  08/24/2016 Document Reviewed: 08/24/2016 Elsevier Interactive Patient Education  2017 [employer].    Follow-Up: At [facility], you and your health needs are our priority.  As part of our continuing mission to provide you with exceptional heart care, we have created designated Provider Care Teams.  These Care Teams include your primary Cardiologist (physician) and Advanced Practice Providers (APPs -  Physician Assistants and Nurse Practitioners) who all work together to provide you with the care you need, when you need it.  We recommend signing up for the patient portal called "MyChart".  Sign up information is provided on this After Visit Summary.  MyChart is used to connect with patients for Virtual Visits (Telemedicine).  Patients are able to view lab/test results, encounter notes, upcoming appointments, etc.  Non-urgent messages can be sent to your provider as well.   To learn more about what you can do with MyChart, go to [email or web address].    Your next appointment:   3 month(s)  Provider:   You may see Lorine Bears, MD or one of the following Advanced Practice Providers on your designated Care Team:   Eula Listen, [place]

## 2023-09-24 ENCOUNTER — Encounter: Payer: Self-pay | Admitting: Internal Medicine

## 2023-09-24 LAB — LIPID PANEL
Chol/HDL Ratio: 5.8 {ratio} — ABNORMAL HIGH (ref 0.0–5.0)
Cholesterol, Total: 245 mg/dL — ABNORMAL HIGH (ref 100–199)
HDL: 42 mg/dL (ref >39)
LDL Chol Calc (NIH): 137 mg/dL — ABNORMAL HIGH (ref 0–99)
Triglycerides: 364 mg/dL — ABNORMAL HIGH (ref 0–149)
VLDL Cholesterol Cal: 66 mg/dL — ABNORMAL HIGH (ref 5–40)

## 2023-09-24 LAB — HEPATIC FUNCTION PANEL
ALT: 31 [IU]/L (ref 0–44)
AST: 33 [IU]/L (ref 0–40)
Albumin: 4.6 g/dL (ref 4.1–5.1)
Alkaline Phosphatase: 37 [IU]/L — ABNORMAL LOW (ref 44–121)
Bilirubin Total: 0.6 mg/dL (ref 0.0–1.2)
Bilirubin, Direct: 0.15 mg/dL (ref 0.00–0.40)
Total Protein: 6.9 g/dL (ref 6.0–8.5)

## 2023-09-24 MED ORDER — ATORVASTATIN CALCIUM 40 MG PO TABS: 40.0000 mg | ORAL_TABLET | Freq: Every day | ORAL | 3 refills | Status: AC

## 2023-10-01 ENCOUNTER — Ambulatory Visit
Admission: RE | Admit: 2023-10-01 | Discharge: 2023-10-01 | Disposition: A | Discharge status: Home / Self Care | Payer: BC Managed Care – PPO | Source: Ambulatory Visit | Attending: Physician Assistant | Admitting: Physician Assistant

## 2023-10-01 ENCOUNTER — Ambulatory Visit
Admission: RE | Admit: 2023-10-01 | Discharge: 2023-10-01 | Disposition: A | Discharge status: Home / Self Care | Payer: Self-pay | Source: Ambulatory Visit | Attending: Physician Assistant | Admitting: Physician Assistant

## 2023-10-01 DIAGNOSIS — G44221 Chronic tension-type headache, intractable: Secondary | ICD-10-CM | POA: Insufficient documentation

## 2023-10-01 DIAGNOSIS — J329 Chronic sinusitis, unspecified: Secondary | ICD-10-CM | POA: Diagnosis not present

## 2023-10-01 DIAGNOSIS — G44229 Chronic tension-type headache, not intractable: Secondary | ICD-10-CM | POA: Diagnosis not present

## 2023-10-01 DIAGNOSIS — E785 Hyperlipidemia, unspecified: Secondary | ICD-10-CM | POA: Insufficient documentation

## 2023-10-01 DIAGNOSIS — G930 Cerebral cysts: Secondary | ICD-10-CM | POA: Diagnosis not present

## 2023-10-01 MED ORDER — GADOBUTROL 1 MMOL/ML IV SOLN
10.0000 mL | Freq: Once | INTRAVENOUS | Status: AC | PRN
  Administered 2023-10-01: 10 mL via INTRAVENOUS

## 2023-10-04 ENCOUNTER — Encounter: Payer: Self-pay | Admitting: Neurology

## 2023-10-04 ENCOUNTER — Other Ambulatory Visit: Payer: Self-pay | Admitting: Emergency Medicine

## 2023-10-04 DIAGNOSIS — G8929 Other chronic pain: Secondary | ICD-10-CM

## 2023-10-08 ENCOUNTER — Encounter: Payer: Self-pay | Admitting: Internal Medicine

## 2023-10-08 ENCOUNTER — Ambulatory Visit: Payer: BC Managed Care – PPO | Admitting: Internal Medicine

## 2023-10-08 VITALS — BP 142/80 | HR 82 | Ht 76.0 in | Wt 264.6 lb

## 2023-10-08 DIAGNOSIS — G6289 Other specified polyneuropathies: Secondary | ICD-10-CM

## 2023-10-08 DIAGNOSIS — E782 Mixed hyperlipidemia: Secondary | ICD-10-CM | POA: Diagnosis not present

## 2023-10-08 DIAGNOSIS — E1142 Type 2 diabetes mellitus with diabetic polyneuropathy: Secondary | ICD-10-CM | POA: Diagnosis not present

## 2023-10-08 NOTE — Patient Instructions (Signed)
Please continue off diabetic medications.  Please come back for a follow-up appointment in 6 months.

## 2023-10-08 NOTE — Progress Notes (Signed)
Patient ID: Tanner Frye, male   DOB: 1976-05-21, 47 y.o.   MRN: [id number]   HPI: Tanner Frye is a 47 y.o.-year-old male, initially referred by his PCP, Saralyn Pilar *, returning for follow-up for DM2, dx in 2021, controlled, with complications (peripheral neuropathy).  Last visit 6 months ago.  Interim hx: No increased urination, blurry vision, nausea, chest pain. He continues lion mane and cordyceps >> work well for his neuropathy. At last visit, he was working out, cardio and strength at home for 1 hour 4 times a week.  However, he decreased his exercise since last visit as he was not feeling very well. He was in the emergency room with dehydration 08/09/2023 - after an episode of gastroenteritis. He had strep throat recently, while in [place] He mentions he has blood pressure issues. Started Amlodipine prn. Also, post neck HAs.  He had an MRI with essentially normal result results.  He is planning to see neurology. His Hb was recently elevated. He is on Testosterone.  He donates blood.  Reviewed HbA1c: Lab Results  Component Value Date   HGBA1C 5.8 (A) 04/07/2023   HGBA1C 5.0 09/23/2022   HGBA1C 5.3 03/18/2022   HGBA1C 5.8 (A) 08/26/2021   HGBA1C 5.5 02/19/2021   HGBA1C 6.2 (A) 11/20/2020   HGBA1C 6.0 (A) 08/01/2020   HGBA1C 8.9 (H) 01/26/2020   HGBA1C 6.8 (H) 03/29/2019   HGBA1C 6.2 (H) 03/25/2018   He is currently not on any diabetic medications. He was previously on Ozempic 0.5 mg weekly - started 08/2021 >> 1 mg weekly >> stopped 12/2022 losing muscle and strength. He was also previously on metformin 500 mg 2x a day with meals but he felt this was not working well for him.  Pt checks his sugars 0-1x a day: - am: 115-120 >> 110s >> 80s-100 >> 100-120 >> 100-110 - 2h after b'fast:139 >> n/c >> 130 >> n/c - before lunch: n/c >> 123 >> 90s >> n/c >> 100-110 - 2h after lunch: n/c >> 101 >> n/c >> 130 >> n/c - before dinner:  90-100 >> 90s >> less than 100 >>  80 - 2h after dinner: n/c >> 105 >> 115-120 >> n/c - bedtime: n/c >> 96 >> n/c >> 90s-105  >> n/c - nighttime: n/c Lowest sugar was 80s >> 90 >> 75 >> 80; it is unclear if he has hypoglycemia awareness. Highest sugar was 120s >> 139 >> 120 >> 110.  Glucometer: Freestyle lite  Pt's meals are: - Breakfast: 2 sugar-free jello cups + 2 eggs + 2 pieces of bacon >> oatmeal - Lunch: prev. Fast food >> now home cooked: chicken + veggies - Dinner: same - Snacks: no desserts, apples, carrots, crackers. No sweet drinks, but water, unsweet tea. Used to drink OJ. He [facility] nutrition [facility] in the past -he felt that this was helpful.  - no CKD, last BUN/creatinine:  Lab Results  Component Value Date   BUN 15 08/09/2023   BUN 16 03/31/2023   CREATININE 1.21 08/09/2023   CREATININE 0.99 03/31/2023   Lab Results  Component Value Date   MICRALBCREAT 0.8 03/18/2022  On losartan 100.  -+ HL; last set of lipids: Lab Results  Component Value Date   CHOL 245 (H) 09/23/2023   HDL 42 09/23/2023   LDLCALC 137 (H) 09/23/2023   LDLDIRECT 130.0 03/18/2022   TRIG 364 (H) 09/23/2023   CHOLHDL 5.8 (H) 09/23/2023  Started pravastatin 20 mg daily 09/2022.  This  was increased to 40 mg daily in 09/2023. Of note, he had a coronary calcium score of 0.  - last eye exam was 10/23/2022: No DR  - + numbness and tingling in his feet. No pain. Started mid 2021.  Last foot exam was here in clinic 04/07/2023.  At that time, it was considered that his neuropathy was related to diabetes. Since he only had numbness at that time, no treatment recommendation was made since none of the available medications help in the absence of pain. Alpha-lipoic acid twice a day Lyrica did not help significantly. Also, L-me-folate did not help.  Exercise helps and his symptoms are increased if he takes a break from exercising for more than 2 to 3 days.   Currently on Cayenne fruit >> helps.  Pt has FH of prediabetes in M.  He also has a  history of transaminitis (increased ALT) and also HTN.  At last visit, we investigated his peripheral neuropathy-B6 vitamin was high, but normalized after stopping multivitamins: Office Visit on 02/19/2021  Component Date Value Ref Range Status   Vitamin B6 02/19/2021 15.0  2.1 - 21.7 ng/mL Final   Office Visit on 11/20/2020  Component Date Value Ref Range Status   TSH 11/20/2020 1.98  0.35 - 4.50 uIU/mL Final   Vitamin B-12 11/20/2020 537  211 - 911 pg/mL Final   Vitamin B6 11/20/2020 42.0* 2.1 - 21.7 ng/mL Final   Ferritin 11/20/2020 49.9  22.0 - 322.0 ng/mL Final   He is on testosterone supplementation by [facility] MD.  ROS: + see HPI  I reviewed pt's medications, allergies, PMH, social hx, family hx, and changes were documented in the history of present illness. Otherwise, unchanged from my initial visit note.  Past Medical History:  Diagnosis Date   Allergy    Anal fissure    Diabetes mellitus without complication (HCC) 2021   Family history of colorectal cancer    GERD (gastroesophageal reflux disease)    Hypertension 2021   Mixed hyperlipidemia 09/23/2022   Personal history of colonic polyps    Type 2 diabetes, controlled, with peripheral neuropathy (HCC)    Past Surgical History:  Procedure Laterality Date   COLONOSCOPY WITH PROPOFOL N/A 04/27/2017   Procedure: COLONOSCOPY WITH PROPOFOL;  Surgeon: Wyline Mood, MD;  Location: [facility] ENDOSCOPY;  Service: Endoscopy;  Laterality: N/A;   COLONOSCOPY WITH PROPOFOL N/A 06/21/2020   Procedure: COLONOSCOPY WITH PROPOFOL;  Surgeon: Wyline Mood, MD;  Location: [facility] ENDOSCOPY;  Service: Gastroenterology;  Laterality: N/A;   COLONOSCOPY WITH PROPOFOL N/A 04/24/2022   Procedure: COLONOSCOPY WITH PROPOFOL;  Surgeon: Wyline Mood, MD;  Location[facility] ENDOSCOPY;  Service: Gastroenterology;  Laterality: N/A;   HERNIA REPAIR  1980   Social History   Socioeconomic History   Marital status: Married    Spouse name: Not on file   Number of  children: Not on file   Years of education: Vocational Degree   Highest education level: Associate degree: occupational, Scientist, product/process development, or vocational program  Occupational History   Occupation: Psychologist, sport and exercise  Tobacco Use   Smoking status: Never   Smokeless tobacco: Never  Vaping Use   Vaping status: Never Used  Substance and Sexual Activity   Alcohol use: Yes    Alcohol/week: 0.0 standard drinks of alcohol    Comment: Wine or beer 1 -2  once or twice a week.   Drug use: No   Sexual activity: Yes    Partners: Female    Birth control/protection: Surgical  Other  Topics Concern   Not on file  Social History Narrative   Right Handed    Lives in a two story home   Social Drivers of Health   Financial Resource Strain: Not on file  Food Insecurity: Not on file  Transportation Needs: Not on file  Physical Activity: Not on file  Stress: Not on file  Social Connections: Not on file  Intimate Partner Violence: Not on file   Current Outpatient Medications on File Prior to Visit  Medication Sig Dispense Refill   amLODipine (NORVASC) 5 MG tablet Take 1 tablet (5 mg total) by mouth daily. 30 tablet 1   Ascorbic Acid (VITAMIN C) 1000 MG tablet Take 1,000 mg by mouth daily.     atorvastatin (LIPITOR) 40 MG tablet Take 1 tablet (40 mg total) by mouth daily. 90 tablet 3   Blood Glucose Monitoring Suppl (FREESTYLE LITE) DEVI Use to check blood sugar up to twice a day as advised. 1 each 0   EPINEPHrine (EPIPEN 2-PAK) 0.3 mg/0.3 mL IJ SOAJ injection Inject 0.3 mg into the muscle as needed for anaphylaxis. 1 each 1   FREESTYLE LITE test strip Use to check blood sugar up to twice a day as advised. 200 each 3   hydrochlorothiazide (HYDRODIURIL) 25 MG tablet Take 1 tablet (25 mg total) by mouth daily. 90 tablet 1   Lancets (FREESTYLE) lancets Use to check blood sugar up to twice a day as advised. 100 each 12   Loratadine 10 MG CAPS      losartan (COZAAR) 100 MG tablet Take 1 tablet (100 mg total) by  mouth daily. 90 tablet 1   Multiple Vitamins-Minerals (MULTIVIT/MULTIMINERAL ADULT PO)      testosterone cypionate (DEPOTESTOSTERONE CYPIONATE) 200 MG/ML injection Inject 200 mg into the muscle once a week.     No current facility-administered medications on file prior to visit.   Allergies  Allergen Reactions   Bee Venom    Oatmeal Swelling   Other Swelling    Processed foods that are baked   Prednisone Other (See Comments)    Other reaction(s): Other (See Comments) Effects mood verbal per patient Other reaction(s): Other (See Comments) Effects mood verbal per patient Effects mood verbal per patient Other reaction(s): Other (See Comments) Effects mood verbal per patient Effects mood verbal per patient   Family History  Problem Relation Age of Onset   Hypertension Mother    Hyperlipidemia Mother    Rectal cancer Brother 62   Cancer Brother    Rectal cancer Paternal Aunt    Colon cancer Paternal Uncle    Prostate cancer Neg Hx    PE: BP (!) 142/80   Pulse 82   Ht 6\' 4"  (1.93 m)   Wt 264 lb 9.6 oz (120 kg)   SpO2 96%   BMI 32.21 kg/m  Wt Readings from Last 3 Encounters:  10/08/23 264 lb 9.6 oz (120 kg)  09/23/23 257 lb 3.2 oz (116.7 kg)  08/11/23 254 lb (115.2 kg)   Constitutional: Muscular, in NAD Eyes: EOMI, no exophthalmos ENT: no thyromegaly, no cervical lymphadenopathy Cardiovascular: RRR, No MRG Respiratory: CTA B Musculoskeletal: no deformities Skin: no rashes Neurological: no tremor with outstretched hands  ASSESSMENT: 1. DM2, non-insulin-dependent, controlled, controlled, with complications - PN  2. HL  3.  Peripheral neuropathy  PLAN:  1. Patient with history of prediabetes but the diagnosis of type 2 diabetes in 2021.  At that time, HbA1c returned elevated, at 8.9% but blood sugars improved afterwards  to a nadir HbA1c of 5.0%.  Please order last visit, he was on Ozempic, losing a significant amount of weight, 20 pounds, right after starting it.   He had problems with insurance covering it but he was then able to go back to Ozempic 1 mg weekly and he was tolerating it well.  However, 3 months before last visit he mentioned that he stopped the medication due to losing muscle and also due to weakness.  He was previously on metformin but he mentions that this did not work well for him. -At last visit, sugars were at goal and HbA1c was 5.8%, slightly higher than before.  We did discuss about continuing without medication but he agreed to restart metformin if absolutely needed in the future -At today's visit, sugars are at goal.  We can continue without diabetic medications for now.  He does inquire about Ozempic to lose weight but I recommended against this in the setting of recent dehydration, increase hemoglobin, posterior headaches and pending further neurology investigation.  He is currently taking amlodipine only intermittently and blood pressure is high today.  I advised him to try to take this every day. - I suggested to:  Patient Instructions  Please continue off diabetic medications.  Please come back for a follow-up appointment in 6 months.  - we checked his HbA1c: 5.9% (only slightly higher) - advised to check sugars at different times of the day - 1x a day, rotating check times - advised for yearly eye exams >> he is UTD - will check an ACR today - return to clinic in 3-4 months  2. HL -Reviewed latest lipid panel from 09/2023: All fractions abnormal. Lab Results  Component Value Date   CHOL 245 (H) 09/23/2023   HDL 42 09/23/2023   LDLCALC 137 (H) 09/23/2023   LDLDIRECT 130.0 03/18/2022   TRIG 364 (H) 09/23/2023   CHOLHDL 5.8 (H) 09/23/2023  -We started atorvastatin 20 mg daily last year.  After the above results returned, this was increased to 40 mg daily by PCP.  3. Peripheral neuropathy -Developed around the time when his diabetes became uncontrolled but his diabetes control improved afterwards with persistent neuropathy  symptoms. He described no sensation in the front part of his sole and increase sensitivity mid foot. -Of note, his brother also developed neuropathy, but after chemotherapy -Symptoms improved with exercising  -Usually he has numbness, but without discomfort -He tried gabapentin, alpha lipoid acid, L-methylfolate without any improvement in symptoms.  We tried Lyrica but this did not work for him. -B6 level was high so we stopped his multivitamins but then normalized B6 level afterwards did not help with symptoms -B12 vitamin and ferritin levels were normal -No history of excess alcohol or toxic exposures. -He started to see neurology-Dr. Allena Katz.  His neuropathy was still deemed to be due to diabetes despite the mild nature of his diabetes.  Since he only had some numbness, only conservative measures were recommended. -Before last visit, he started to use cayenne fruit, and also [employer] and Cordyceps.  These worked well for him.  Carlus Pavlov, MD PhD [facility] Endocrinology

## 2023-10-09 LAB — MICROALBUMIN / CREATININE URINE RATIO
Creatinine, Urine: 84 mg/dL (ref 20–320)
Microalb Creat Ratio: 2 mg/g{creat} (ref <30)
Microalb, Ur: 0.2 mg/dL

## 2023-11-20 ENCOUNTER — Other Ambulatory Visit: Payer: Self-pay | Admitting: Medical

## 2023-11-22 ENCOUNTER — Other Ambulatory Visit: Payer: Self-pay | Admitting: Physician Assistant

## 2023-11-22 ENCOUNTER — Other Ambulatory Visit: Payer: Self-pay | Admitting: Emergency Medicine

## 2023-11-22 MED ORDER — AMLODIPINE BESYLATE 5 MG PO TABS: 5.0000 mg | ORAL_TABLET | Freq: Every day | ORAL | 3 refills | Status: AC

## 2023-11-22 NOTE — Telephone Encounter (Signed)
*  STAT* If patient is at the pharmacy, call can be transferred to refill team.   1. Which medications need to be refilled? (please list name of each medication and dose if known) Amlodipine   2. Would you like to learn more about the convenience, safety, & potential cost savings by using the [facility]? no   3. Are you open to using the [facility] (Type [facility].  ).no   4. Which pharmacy/location (including street and city if local pharmacy) is medication to be sent to?[facility]   5. Do they need a 30 day or 90 day supply? 90

## 2023-11-23 ENCOUNTER — Encounter: Payer: Self-pay | Admitting: Neurology

## 2023-11-23 ENCOUNTER — Ambulatory Visit: Payer: BC Managed Care – PPO | Admitting: Neurology

## 2023-11-23 VITALS — BP 123/76 | HR 97 | Ht 76.0 in | Wt 263.0 lb

## 2023-11-23 DIAGNOSIS — G930 Cerebral cysts: Secondary | ICD-10-CM

## 2023-11-23 DIAGNOSIS — G4484 Primary exertional headache: Secondary | ICD-10-CM

## 2023-11-23 NOTE — Patient Instructions (Signed)
MRA and MRV head  Referral to neurosurgery

## 2023-11-23 NOTE — Progress Notes (Signed)
 Follow-up Visit   Date: 11/23/2023    Tanner Frye MRN: [id number] DOB: 07/17/1976    Tanner Frye is a 48 y.o. right-handed Caucasian male with well-controlled diabetes mellitus, hypertension, and GERD returning to the clinic for follow-up of headache.  The patient was accompanied to the clinic by wife who also provides collateral information.    IMPRESSION/PLAN: New onset exertional headache with imaging shows left retrocerebellar arachnoid cyst. Headache is triggered by exercise and seems to be positional.  I would like to get the opinion of neurosurgery given the concern that he may be symptomatic from this.  I will also check MRV and MRA head.  For pain relief, he is not keen on taking medications and prefers not to be on prescription medication.   Return to clinic in 2 months  ---------------------------------------------  UPDATE 11/23/2023:  He is here with new complains of chronic daily headaches.  In September, he began having a spells of dizziness worse when he was laying flat.  He started having pressure-like headache radiating over the top of the head which caused him to collapse to his knees.  The severity of pain is momentary, but he will have dull holocephalic headache the entire.  He has ongoing spells of pressure at the base of the neck and back of the head.  It is worse when bending forward or exerting himself, such as weight lifting.  He has avoided exercising because of this.  It occurs several times per week.  No changes in vision, numbness/tingling, or weakness.   Medications:  Current Outpatient Medications on File Prior to Visit  Medication Sig Dispense Refill   amLODipine  (NORVASC ) 5 MG tablet Take 1 tablet (5 mg total) by mouth daily. 90 tablet 3   Ascorbic Acid (VITAMIN C) 1000 MG tablet Take 1,000 mg by mouth daily.     atorvastatin  (LIPITOR) 40 MG tablet Take 1 tablet (40 mg total) by mouth daily. 90 tablet 3   Blood Glucose Monitoring Suppl (FREESTYLE  LITE) DEVI Use to check blood sugar up to twice a day as advised. 1 each 0   EPINEPHrine  (EPIPEN  2-PAK) 0.3 mg/0.3 mL IJ SOAJ injection Inject 0.3 mg into the muscle as needed for anaphylaxis. 1 each 1   FREESTYLE LITE test strip Use to check blood sugar up to twice a day as advised. 200 each 3   hydrochlorothiazide  (HYDRODIURIL ) 25 MG tablet Take 1 tablet (25 mg total) by mouth daily. 90 tablet 1   Lancets (FREESTYLE) lancets Use to check blood sugar up to twice a day as advised. 100 each 12   Loratadine 10 MG CAPS      losartan  (COZAAR ) 100 MG tablet Take 1 tablet (100 mg total) by mouth daily. 90 tablet 1   Multiple Vitamins-Minerals (MULTIVIT/MULTIMINERAL ADULT PO)      No current facility-administered medications on file prior to visit.    Allergies:  Allergies  Allergen Reactions   Bee Venom    Oatmeal Swelling   Other Swelling    Processed foods that are baked   Prednisone  Other (See Comments)    Other reaction(s): Other (See Comments) Effects mood verbal per patient Other reaction(s): Other (See Comments) Effects mood verbal per patient Effects mood verbal per patient Other reaction(s): Other (See Comments) Effects mood verbal per patient Effects mood verbal per patient    Vital Signs:  BP 123/76   Pulse 97   Ht 6' 4 (1.93 m)   Wt 263 lb (119.3  kg)   SpO2 97%   BMI 32.01 kg/m     Neurological Exam: MENTAL STATUS including orientation to time, place, person, recent and remote memory, attention span and concentration, language, and fund of knowledge is normal.  Speech is not dysarthric.  CRANIAL NERVES:  Normal fundoscopic exam. No visual field defects.  Pupils equal round and reactive to light.  Normal conjugate, extra-ocular eye movements in all directions of gaze.  No ptosis.  Face is symmetric. Palate elevates symmetrically.  Tongue is midline.  MOTOR:  Motor strength is 5/5 in all extremities.  No atrophy, fasciculations or abnormal movements.  No pronator  drift.  Tone is normal.    MSRs:  Reflexes are 2+/4 throughout.  SENSORY:  Intact to vibration throughout.  COORDINATION/GAIT:  Normal finger-to- nose-finger.  Intact rapid alternating movements bilaterally.  Gait narrow based and stable. Stressed and tandem gait intact.   Data: MRI brain wwo contrast 10/01/2023: IMPRESSION: 1. No evidence of an acute intracranial abnormality. 2. Two tiny nonspecific chronic insults within the left parietal lobe white matter. 3. Left retrocerebellar arachnoid cyst. 4. Otherwise unremarkable MRI appearance of the brain. 5. Mild paranasal sinus disease.  Total time spent reviewing records, interview, history/exam, documentation, and coordination of care on day of encounter:  40 min   Thank you for allowing me to participate in patient's care.  If I can answer any additional questions, I would be pleased to do so.    Sincerely,    Allina Riches K. Tobie, DO

## 2023-11-24 ENCOUNTER — Encounter: Payer: Self-pay | Admitting: Internal Medicine

## 2023-11-24 NOTE — Telephone Encounter (Signed)
Please advise,I read the last office note: It mentions to stay off diabetic medications.

## 2023-11-26 ENCOUNTER — Other Ambulatory Visit: Payer: Self-pay | Admitting: Internal Medicine

## 2023-11-26 MED ORDER — SEMAGLUTIDE(0.25 OR 0.5MG/DOS) 2 MG/3ML ~~LOC~~ SOPN: PEN_INJECTOR | SUBCUTANEOUS | 1 refills | Status: DC

## 2023-11-29 ENCOUNTER — Telehealth: Payer: Self-pay

## 2023-11-29 ENCOUNTER — Encounter: Payer: Self-pay | Admitting: Neurology

## 2023-11-29 MED ORDER — SEMAGLUTIDE(0.25 OR 0.5MG/DOS) 2 MG/3ML ~~LOC~~ SOPN: PEN_INJECTOR | SUBCUTANEOUS | 1 refills | Status: DC

## 2023-11-29 NOTE — Telephone Encounter (Signed)
 Per Message from MyChart pt requested Ozempic  to be sent to: [facility] in [place] [facility]   Signed Prescriptions Disp Refills   Semaglutide ,0.25 or 0.5MG /DOS, 2 MG/3ML SOPN 9 mL 1    Sig: Inject 0.5 mg under the skin every week.    Authorizing Provider: Emilie Harden    Ordering User: Vernon Goodpasture

## 2023-12-03 ENCOUNTER — Telehealth: Payer: Self-pay | Admitting: Neurology

## 2023-12-03 NOTE — Telephone Encounter (Signed)
Patient wife called and states that they were referred to Neurosurgery and have not heard anything so they were calling to check the status

## 2023-12-03 NOTE — Telephone Encounter (Signed)
Re faxed 12/03/23. Will follow up on Monday to see if received.

## 2023-12-06 NOTE — Telephone Encounter (Signed)
Received call back from Richards at [facility] and patient is scheduled for 12/24/23. He will be having his appointment after his testing is finished.

## 2023-12-06 NOTE — Telephone Encounter (Signed)
[facility] and left a message with the referral coordinator to see if referral was received for patient. Pending response.

## 2023-12-07 ENCOUNTER — Ambulatory Visit
Admission: RE | Admit: 2023-12-07 | Discharge: 2023-12-07 | Disposition: A | Discharge status: Home / Self Care | Payer: BC Managed Care – PPO | Source: Ambulatory Visit | Attending: Neurology

## 2023-12-07 DIAGNOSIS — G4484 Primary exertional headache: Secondary | ICD-10-CM

## 2023-12-07 DIAGNOSIS — G930 Cerebral cysts: Secondary | ICD-10-CM

## 2023-12-13 ENCOUNTER — Encounter: Payer: Self-pay | Admitting: Neurology

## 2023-12-13 MED ORDER — DIAZEPAM 5 MG PO TABS: ORAL_TABLET | ORAL | 0 refills | Status: DC

## 2023-12-16 ENCOUNTER — Ambulatory Visit
Admission: RE | Admit: 2023-12-16 | Discharge: 2023-12-16 | Discharge status: Home / Self Care | Payer: BC Managed Care – PPO | Source: Ambulatory Visit | Attending: Neurology

## 2023-12-16 DIAGNOSIS — G930 Cerebral cysts: Secondary | ICD-10-CM

## 2023-12-16 DIAGNOSIS — G4484 Primary exertional headache: Secondary | ICD-10-CM

## 2023-12-16 MED ORDER — GADOPICLENOL 0.5 MMOL/ML IV SOLN
10.0000 mL | Freq: Once | INTRAVENOUS | Status: AC | PRN
  Administered 2023-12-16: 10 mL via INTRAVENOUS

## 2023-12-24 DIAGNOSIS — M5481 Occipital neuralgia: Secondary | ICD-10-CM | POA: Diagnosis not present

## 2023-12-24 DIAGNOSIS — G930 Cerebral cysts: Secondary | ICD-10-CM | POA: Diagnosis not present

## 2023-12-28 ENCOUNTER — Encounter: Payer: Self-pay | Admitting: Physician Assistant

## 2023-12-28 ENCOUNTER — Ambulatory Visit: Payer: BC Managed Care – PPO | Attending: Physician Assistant | Admitting: Physician Assistant

## 2023-12-28 VITALS — BP 115/72 | HR 68 | Ht 76.0 in | Wt 268.8 lb

## 2023-12-28 DIAGNOSIS — Z79899 Other long term (current) drug therapy: Secondary | ICD-10-CM | POA: Diagnosis not present

## 2023-12-28 DIAGNOSIS — E785 Hyperlipidemia, unspecified: Secondary | ICD-10-CM | POA: Diagnosis not present

## 2023-12-28 DIAGNOSIS — E782 Mixed hyperlipidemia: Secondary | ICD-10-CM | POA: Diagnosis not present

## 2023-12-28 DIAGNOSIS — I10 Essential (primary) hypertension: Secondary | ICD-10-CM

## 2023-12-28 NOTE — Progress Notes (Signed)
 Cardiology Office Note    Date:  12/28/2023   ID:  Tanner Frye, DOB 15-Dec-1975, MRN [id number]  PCP:  Smitty Cords, DO  Cardiologist:  Lorine Bears, MD  Electrophysiologist:  None   Chief Complaint: Follow-up  History of Present Illness:   Tanner Frye is a 48 y.o. male with history of HTN, DM2 diagnosed in 2021, obesity, and GERD who presents for follow-up of hypertension.   Calcium score in 2016 of 0 with scattered small bilateral pulmonary nodules measuring up to 7.4 mm in the right medial lobe noted.  Repeat calcium score in 07/2018 of 0 with stable scattered pulmonary nodules with no additional follow-up indicated.  He was seen in the office on 08/09/2023 and reported elevated BP readings and headaches.  He reported a BP of 174/101.  He indicated he was undergoing testosterone replacement therapy.  He was without symptoms of angina or cardiac decompensation.  He did note some GI upset after eating at a restaurant.  Blood pressure was well-controlled in the office at 126/78.  In the setting of ongoing severe abdominal cramping and nausea he was transferred to the ED where BP remained well-controlled at 131/60.  Labs were notable for hyponatremia with a sodium of 129, chloride 88, WBC 12.1, Hgb 17.4, and normal lipase.  CT of the abdomen/pelvis that showed no acute abnormality.  Symptoms improved with IV fluids with recommendation to take Bentyl and Zofran.  He followed up with PCP 2 days later with well-controlled blood pressure and recommendation to start hyoscyamine.  He was most recently seen in the office in 09/2023 and was without symptoms of angina or cardiac decompensation.  Calcium score of 0.  Blood pressure was well-controlled.  He did continue to note posterior neck/vertebral headache that has been fairly constant since 06/2023.  In this setting he underwent MRI of the brain that showed no evidence of acute intracranial abnormality with 2 tiny nonspecific chronic  insults within the left parietal as well as a left retrocerebellar arachnoid cyst.  These findings he was referred to neurology subsequent MRA and MRV of the head showing no significant vascular abnormality/patent dural venous sinuses respectively.  He was subsequently referred to neurosurgery.  He reports they are considering occipital injections.  He comes in doing well from a cardiac perspective and is without symptoms of angina or cardiac decompensation.  He does continue to note mild headache as well as dizziness if he gazes upwards.  No presyncope or syncope.  No lower extremity swelling, falls, or symptoms concerning for bleeding.  Has tolerated the transition from pravastatin to atorvastatin.  He does not have any acute cardiac concerns at this time.   Labs independently reviewed: 09/2023 - albumin 4.6, AST/ALT normal, TC 245, TG 364, HDL 42, LDL 137 07/2023 - Hgb 17.4, PLT 343, potassium 3.8, BUN 15, serum creatinine 1.21 03/2023 - A1c 5.8, TSH normal  Past Medical History:  Diagnosis Date   Allergy    Anal fissure    Diabetes mellitus without complication (HCC) 2021   Family history of colorectal cancer    GERD (gastroesophageal reflux disease)    Hypertension 2021   Mixed hyperlipidemia 09/23/2022   Personal history of colonic polyps    Type 2 diabetes, controlled, with peripheral neuropathy (HCC)     Past Surgical History:  Procedure Laterality Date   COLONOSCOPY WITH PROPOFOL N/A 04/27/2017   Procedure: COLONOSCOPY WITH PROPOFOL;  Surgeon: Wyline Mood, MD;  Location[facility] ENDOSCOPY;  Service: Endoscopy;  Laterality: N/A;   COLONOSCOPY WITH PROPOFOL N/A 06/21/2020   Procedure: COLONOSCOPY WITH PROPOFOL;  Surgeon: Wyline Mood, MD;  Location: [facility] ENDOSCOPY;  Service: Gastroenterology;  Laterality: N/A;   COLONOSCOPY WITH PROPOFOL N/A 04/24/2022   Procedure: COLONOSCOPY WITH PROPOFOL;  Surgeon: Wyline Mood, MD;  Location: [facility] ENDOSCOPY;  Service: Gastroenterology;  Laterality: N/A;    HERNIA REPAIR  1980    Current Medications: Current Meds  Medication Sig   amLODipine (NORVASC) 5 MG tablet Take 1 tablet (5 mg total) by mouth daily.   Ascorbic Acid (VITAMIN C) 1000 MG tablet Take 1,000 mg by mouth daily.   Blood Glucose Monitoring Suppl (FREESTYLE LITE) DEVI Use to check blood sugar up to twice a day as advised.   diazepam (VALIUM) 5 MG tablet Take 1 tablet 30-min prior to MRI. Do not drive while taking this medication   EPINEPHrine (EPIPEN 2-PAK) 0.3 mg/0.3 mL IJ SOAJ injection Inject 0.3 mg into the muscle as needed for anaphylaxis.   FREESTYLE LITE test strip Use to check blood sugar up to twice a day as advised.   hydrochlorothiazide (HYDRODIURIL) 25 MG tablet Take 1 tablet (25 mg total) by mouth daily.   Lancets (FREESTYLE) lancets Use to check blood sugar up to twice a day as advised.   Loratadine 10 MG CAPS    losartan (COZAAR) 100 MG tablet Take 1 tablet (100 mg total) by mouth daily.   Multiple Vitamins-Minerals (MULTIVIT/MULTIMINERAL ADULT PO)    Semaglutide,0.25 or 0.5MG /DOS, 2 MG/3ML SOPN Inject 0.5 mg under the skin every week.    Allergies:   Bee venom, Oatmeal, Other, and Prednisone   Social History   Socioeconomic History   Marital status: Married    Spouse name: Not on file   Number of children: Not on file   Years of education: Vocational Degree   Highest education level: Associate degree: occupational, Scientist, product/process development, or vocational program  Occupational History   Occupation: Psychologist, sport and exercise  Tobacco Use   Smoking status: Never   Smokeless tobacco: Never  Vaping Use   Vaping status: Never Used  Substance and Sexual Activity   Alcohol use: Yes    Alcohol/week: 0.0 standard drinks of alcohol    Comment: Wine or beer 1 -2  once or twice a week.   Drug use: No   Sexual activity: Yes    Partners: Female    Birth control/protection: Surgical  Other Topics Concern   Not on file  Social History Narrative   Right Handed    Lives in a two  story home   Social Drivers of Health   Financial Resource Strain: Not on file  Food Insecurity: Not on file  Transportation Needs: Not on file  Physical Activity: Not on file  Stress: Not on file  Social Connections: Not on file     Family History:  The patient's family history includes Cancer in his brother; Colon cancer in his paternal uncle; Hyperlipidemia in his mother; Hypertension in his mother; Rectal cancer in his paternal aunt; Rectal cancer (age of onset: 82) in his brother. There is no history of Prostate cancer.  ROS:   12-point review of systems is negative unless otherwise noted in the HPI.   EKGs/Labs/Other Studies Reviewed:    Studies reviewed were summarized above. The additional studies were reviewed today:  Calcium score 10/08/2015: FINDINGS: Non-cardiac: Findings on limited lung and soft tissue windows. See separate report from [facility]. Ascending Aorta:  3.0 cm Pericardium: Normal Coronary arteries:  No  calcium detected IMPRESSION: 1) Coronary calcium score of 0. 2) See radiology note regarding lung nodules __________   Calcium score 08/01/2018: FINDINGS: Non-cardiac: See separate report from [facility]. Ascending Aorta: Normal diameter 3.3 cm Pericardium: Normal Coronary arteries: No calcium detected IMPRESSION: Coronary calcium score of 0.     Noncardiac overread: IMPRESSION: Small scattered pulmonary nodules, stable since 2016 compatible with benign nodules. No additional follow-up necessary.   Fatty infiltration of the liver. __________  Calcium score 10/01/2023: IMPRESSION AND RECOMMENDATION: 1. Coronary calcium score of 0.   2. CAC 0, CAC-DRS A0.   3. Continue heart healthy lifestyle and risk factor modification.   Noncardiac overread: IMPRESSION: 1. Small pulmonary nodules are stable from 2019 and considered definitively benign. No further imaging follow-up is needed. 2. Mild hepatic  steatosis.   EKG:  EKG is ordered today.  The EKG ordered today demonstrates NSR, 75 bpm, incomplete RBBB  Recent Labs: 03/31/2023: TSH 2.85 08/09/2023: BUN 15; Creatinine, Ser 1.21; Hemoglobin 17.4; Platelets 343; Potassium 3.8; Sodium 129 09/23/2023: ALT 31  Recent Lipid Panel    Component Value Date/Time   CHOL 245 (H) 09/23/2023 0946   TRIG 364 (H) 09/23/2023 0946   HDL 42 09/23/2023 0946   CHOLHDL 5.8 (H) 09/23/2023 0946   CHOLHDL 4.6 03/31/2023 0934   VLDL 56.0 (H) 03/18/2022 0917   LDLCALC 137 (H) 09/23/2023 0946   LDLCALC 128 (H) 03/31/2023 0934   LDLDIRECT 130.0 03/18/2022 0917    PHYSICAL EXAM:    VS:  BP 115/72 (BP Location: Left Arm, Patient Position: Sitting, Cuff Size: Large)   Pulse 68   Ht 6\' 4"  (1.93 m)   Wt 268 lb 12.8 oz (121.9 kg)   SpO2 95%   BMI 32.72 kg/m   BMI: Body mass index is 32.72 kg/m.  Physical Exam Vitals reviewed.  Constitutional:      Appearance: He is well-developed.  HENT:     Head: Normocephalic and atraumatic.  Eyes:     General:        Right eye: No discharge.        Left eye: No discharge.  Cardiovascular:     Rate and Rhythm: Normal rate and regular rhythm.     Heart sounds: Normal heart sounds, S1 normal and S2 normal. Heart sounds not distant. No midsystolic click and no opening snap. No murmur heard.    No friction rub.  Pulmonary:     Effort: Pulmonary effort is normal. No respiratory distress.     Breath sounds: Normal breath sounds. No decreased breath sounds, wheezing, rhonchi or rales.  Chest:     Chest wall: No tenderness.  Musculoskeletal:     Cervical back: Normal range of motion.     Right lower leg: No edema.     Left lower leg: No edema.  Skin:    General: Skin is warm and dry.     Nails: There is no clubbing.  Neurological:     Mental Status: He is alert and oriented to person, place, and time.  Psychiatric:        Speech: Speech normal.        Behavior: Behavior normal.        Thought Content:  Thought content normal.        Judgment: Judgment normal.     Wt Readings from Last 3 Encounters:  12/28/23 268 lb 12.8 oz (121.9 kg)  11/23/23 263 lb (119.3 kg)  10/08/23 264 lb 9.6 oz (120 kg)  ASSESSMENT & PLAN:   HTN: Blood pressure is well-controlled in the office today.  He remains on amlodipine 5 mg, hydrochlorothiazide 25 mg, and losartan 100 mg.  HLD: LDL 137 in 09/2023.  Now on atorvastatin 40 mg in place of pravastatin.  Check lipid panel, direct LDL, and LFT.  DM2: Followed by endocrinology.    Disposition: F/u with Dr. Kirke Corin or an APP in 6 months.   Medication Adjustments/Labs and Tests Ordered: Current medicines are reviewed at length with the patient today.  Concerns regarding medicines are outlined above. Medication changes, Labs and Tests ordered today are summarized above and listed in the Patient Instructions accessible in Encounters.   Signed, Eula Listen, PA-C 12/28/2023 9:23 AM     [facility] [address] [place], [place] [address] [phone]

## 2023-12-28 NOTE — Patient Instructions (Signed)
 Medication Instructions:  Your Physician recommend you continue on your current medication as directed.    *If you need a refill on your cardiac medications before your next appointment, please call your pharmacy*   Lab Work: Your provider would like for you to have following labs drawn today Lipid panel, Direct LDL, and Liver function test..   If you have labs (blood work) drawn today and your tests are completely normal, you will receive your results only by: MyChart Message (if you have MyChart) OR A paper copy in the mail If you have any lab test that is abnormal or we need to change your treatment, we will call you to review the results.   Follow-Up: At [facility], you and your health needs are our priority.  As part of our continuing mission to provide you with exceptional heart care, we have created designated Provider Care Teams.  These Care Teams include your primary Cardiologist (physician) and Advanced Practice Providers (APPs -  Physician Assistants and Nurse Practitioners) who all work together to provide you with the care you need, when you need it.   Your next appointment:   6 month(s)  Provider:   You may see Lorine Bears, MD or Eula Listen, PA-C

## 2023-12-29 LAB — LIPID PANEL
Chol/HDL Ratio: 4 ratio (ref 0.0–5.0)
Cholesterol, Total: 179 mg/dL (ref 100–199)
HDL: 45 mg/dL (ref >39)
LDL Chol Calc (NIH): 76 mg/dL (ref 0–99)
Triglycerides: 360 mg/dL — ABNORMAL HIGH (ref 0–149)
VLDL Cholesterol Cal: 58 mg/dL — ABNORMAL HIGH (ref 5–40)

## 2023-12-29 LAB — HEPATIC FUNCTION PANEL
ALT: 35 IU/L (ref 0–44)
AST: 37 IU/L (ref 0–40)
Albumin: 4.6 g/dL (ref 4.1–5.1)
Alkaline Phosphatase: 48 IU/L (ref 44–121)
Bilirubin Total: 0.6 mg/dL (ref 0.0–1.2)
Bilirubin, Direct: 0.17 mg/dL (ref 0.00–0.40)
Total Protein: 6.9 g/dL (ref 6.0–8.5)

## 2023-12-29 LAB — LDL CHOLESTEROL, DIRECT: LDL Direct: 103 mg/dL — ABNORMAL HIGH (ref 0–99)

## 2024-01-04 ENCOUNTER — Other Ambulatory Visit (HOSPITAL_COMMUNITY): Payer: Self-pay

## 2024-01-04 ENCOUNTER — Telehealth: Payer: Self-pay

## 2024-01-04 ENCOUNTER — Encounter: Payer: Self-pay | Admitting: Family Medicine

## 2024-01-04 NOTE — Telephone Encounter (Signed)
 Pharmacy Patient Advocate Encounter   Received notification from CoverMyMeds that prior authorization for Ozempic is required/requested.   Insurance verification completed.   The patient is insured through [facility] .   Per test claim: Refill too soon. PA is not needed at this time. Medication was filled 11/26/23. Next eligible fill date is 01/28/24.

## 2024-01-06 ENCOUNTER — Telehealth: Payer: Self-pay

## 2024-01-06 NOTE — Telephone Encounter (Signed)
 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 His last colonoscopy wa on 04/25/2023. Patient had called his PCP to let him know as well. Patient is noty having any current issues. Please advise since he was informed that he is not due to have one but yet he wants one done every two years.

## 2024-01-06 NOTE — Telephone Encounter (Signed)
 Called patient back to let him know Dr. Johnney Killian explanation of why he does not recommend a colonoscopy until 2028. I also let him know that if he started to have any type of symptoms, to let Dr. Althea Charon know and then he could get referred back to [place]. However, I let him know that he is already in our recall list to schedule him his colonoscopy on 04/2027. Patient understood and had no further questions.

## 2024-01-12 DIAGNOSIS — D229 Melanocytic nevi, unspecified: Secondary | ICD-10-CM | POA: Diagnosis not present

## 2024-01-12 DIAGNOSIS — Z86018 Personal history of other benign neoplasm: Secondary | ICD-10-CM | POA: Diagnosis not present

## 2024-01-12 DIAGNOSIS — L578 Other skin changes due to chronic exposure to nonionizing radiation: Secondary | ICD-10-CM | POA: Diagnosis not present

## 2024-01-12 DIAGNOSIS — Z872 Personal history of diseases of the skin and subcutaneous tissue: Secondary | ICD-10-CM | POA: Diagnosis not present

## 2024-01-14 MED ORDER — LOSARTAN POTASSIUM 100 MG PO TABS: 100.0000 mg | ORAL_TABLET | Freq: Every day | ORAL | 3 refills | Status: AC

## 2024-01-26 ENCOUNTER — Ambulatory Visit: Payer: BC Managed Care – PPO | Admitting: Neurology

## 2024-01-26 ENCOUNTER — Encounter: Payer: Self-pay | Admitting: Neurology

## 2024-01-26 VITALS — BP 113/81 | HR 76 | Ht 76.0 in | Wt 269.0 lb

## 2024-01-26 DIAGNOSIS — M542 Cervicalgia: Secondary | ICD-10-CM

## 2024-01-26 DIAGNOSIS — R42 Dizziness and giddiness: Secondary | ICD-10-CM | POA: Diagnosis not present

## 2024-01-26 MED ORDER — CYCLOBENZAPRINE HCL 5 MG PO TABS: 5.0000 mg | ORAL_TABLET | Freq: Two times a day (BID) | ORAL | 3 refills | Status: DC | PRN

## 2024-01-26 NOTE — Progress Notes (Signed)
 Follow-up Visit   Date: 01/26/2024    QUINNLAN ABRUZZO MRN: [id number] DOB: 11-Sep-1976    Tanner Frye is a 48 y.o. right-handed Caucasian male with well-controlled diabetes mellitus, hypertension, and GERD returning to the clinic for follow-up of headache.  The patient was accompanied to the clinic by wife who also provides collateral information.    IMPRESSION/PLAN: Cervicalgia with possible cervicogenic dizziness  - Start flexeril 5mg  at bedtime as needed, ok to take it BID prn Left retrocerebellar arachnoid cyst, benign.  Evaluated by neurosurgery.   Return to clinic in 4 months  ---------------------------------------------  UPDATE 11/23/2023:  He is here with new complains of chronic daily headaches.  In September, he began having a spells of dizziness worse when he was laying flat.  He started having pressure-like headache radiating over the top of the head which caused him to collapse to his knees.  The severity of pain is momentary, but he will have dull holocephalic headache the entire.  He has ongoing spells of pressure at the base of the neck and back of the head.  It is worse when bending forward or exerting himself, such as weight lifting.  He has avoided exercising because of this.  It occurs several times per week.  No changes in vision, numbness/tingling, or weakness.   UPDATE 01/26/2024: He is here for follow-up visit.  He has pressure at the base of the neck, which was initially daily and now occur about 3-4 times per week. Pain can last anywhere from a few hours to all day.   He is doing less active work which has helped.  He also gets dizziness occasionally with neck pain or when he closes his eyes. He saw neurosurgery for his arachnoid cyst who felt it was incidental findings and not contributing to his headaches.    Medications:  Current Outpatient Medications on File Prior to Visit  Medication Sig Dispense Refill   amLODipine (NORVASC) 5 MG tablet Take 1 tablet  (5 mg total) by mouth daily. 90 tablet 3   Ascorbic Acid (VITAMIN C) 1000 MG tablet Take 1,000 mg by mouth daily.     Blood Glucose Monitoring Suppl (FREESTYLE LITE) DEVI Use to check blood sugar up to twice a day as advised. 1 each 0   diazepam (VALIUM) 5 MG tablet Take 1 tablet 30-min prior to MRI. Do not drive while taking this medication 2 tablet 0   EPINEPHrine (EPIPEN 2-PAK) 0.3 mg/0.3 mL IJ SOAJ injection Inject 0.3 mg into the muscle as needed for anaphylaxis. 1 each 1   FREESTYLE LITE test strip Use to check blood sugar up to twice a day as advised. 200 each 3   hydrochlorothiazide (HYDRODIURIL) 25 MG tablet Take 1 tablet (25 mg total) by mouth daily. 90 tablet 1   Lancets (FREESTYLE) lancets Use to check blood sugar up to twice a day as advised. 100 each 12   Loratadine 10 MG CAPS      losartan (COZAAR) 100 MG tablet Take 1 tablet (100 mg total) by mouth daily. 90 tablet 3   Multiple Vitamins-Minerals (MULTIVIT/MULTIMINERAL ADULT PO)      Semaglutide,0.25 or 0.5MG /DOS, 2 MG/3ML SOPN Inject 0.5 mg under the skin every week. 9 mL 1   atorvastatin (LIPITOR) 40 MG tablet Take 1 tablet (40 mg total) by mouth daily. 90 tablet 3   No current facility-administered medications on file prior to visit.    Allergies:  Allergies  Allergen Reactions   Bee  Venom    Oatmeal Swelling   Other Swelling    Processed foods that are baked   Prednisone Other (See Comments)    Other reaction(s): Other (See Comments) Effects mood verbal per patient Other reaction(s): Other (See Comments) Effects mood verbal per patient Effects mood verbal per patient Other reaction(s): Other (See Comments) Effects mood verbal per patient Effects mood verbal per patient    Vital Signs:  BP 113/81   Pulse 76   Ht 6\' 4"  (1.93 m)   Wt 269 lb (122 kg)   SpO2 96%   BMI 32.74 kg/m     Neurological Exam: MENTAL STATUS including orientation to time, place, person, recent and remote memory, attention span and  concentration, language, and fund of knowledge is normal.  Speech is not dysarthric.  CRANIAL NERVES:   Pupils equal round and reactive to light.  Normal conjugate, extra-ocular eye movements in all directions of gaze.  No ptosis.  Face is symmetric. Palate elevates symmetrically.  Tongue is midline.  MOTOR:  Motor strength is 5/5 in all extremities.  No atrophy, fasciculations or abnormal movements.  No pronator drift.  Tone is normal.    MSRs:  Reflexes are 2+/4 throughout.  SENSORY:  Intact to vibration throughout.  COORDINATION/GAIT:  Normal finger-to- nose-finger.  Intact rapid alternating movements bilaterally.  Gait narrow based and stable. Stressed and tandem gait intact.   Data: MRI brain wwo contrast 10/01/2023: IMPRESSION: 1. No evidence of an acute intracranial abnormality. 2. Two tiny nonspecific chronic insults within the left parietal lobe white matter. 3. Left retrocerebellar arachnoid cyst. 4. Otherwise unremarkable MRI appearance of the brain. 5. Mild paranasal sinus disease.  MRV head wwo contast 12/16/2023:  Patent dural venous sinuses MRA head wo contrast 12/16/2023:  No significant abnormalities   Thank you for allowing me to participate in patient's care.  If I can answer any additional questions, I would be pleased to do so.    Sincerely,    Saburo Luger K. Allena Katz, DO

## 2024-01-26 NOTE — Patient Instructions (Addendum)
 Start flexeril 5mg  at bedtime as needed for headaches, you may take it twice daily as needed.

## 2024-03-01 ENCOUNTER — Other Ambulatory Visit: Payer: Self-pay | Admitting: Internal Medicine

## 2024-03-30 ENCOUNTER — Ambulatory Visit: Payer: Self-pay | Admitting: Family Medicine

## 2024-03-30 ENCOUNTER — Encounter: Payer: Self-pay | Admitting: Family Medicine

## 2024-03-30 VITALS — BP 110/70 | HR 78 | Ht 76.0 in | Wt 264.0 lb

## 2024-03-30 DIAGNOSIS — E1169 Type 2 diabetes mellitus with other specified complication: Secondary | ICD-10-CM | POA: Diagnosis not present

## 2024-03-30 DIAGNOSIS — Z125 Encounter for screening for malignant neoplasm of prostate: Secondary | ICD-10-CM

## 2024-03-30 DIAGNOSIS — Z0000 Encounter for general adult medical examination without abnormal findings: Secondary | ICD-10-CM | POA: Diagnosis not present

## 2024-03-30 DIAGNOSIS — I10 Essential (primary) hypertension: Secondary | ICD-10-CM

## 2024-03-30 DIAGNOSIS — Z23 Encounter for immunization: Secondary | ICD-10-CM

## 2024-03-30 DIAGNOSIS — E785 Hyperlipidemia, unspecified: Secondary | ICD-10-CM | POA: Diagnosis not present

## 2024-03-30 DIAGNOSIS — Z7985 Long-term (current) use of injectable non-insulin antidiabetic drugs: Secondary | ICD-10-CM

## 2024-03-30 MED ORDER — HYDROCHLOROTHIAZIDE 25 MG PO TABS: 25.0000 mg | ORAL_TABLET | Freq: Every day | ORAL | 3 refills | Status: AC

## 2024-03-30 NOTE — Patient Instructions (Addendum)
 Thank you for coming to the office today.  Colon Cancer Screening:  July 2026 - we can try to order the Cologuard  - For all adults age 48+ routine colon cancer screening is highly recommended.     - Recent guidelines from American Cancer Society recommend starting age of 69 - Early detection of colon cancer is important, because often there are no warning signs or symptoms, also if found early usually it can be cured. Late stage is hard to treat.  - If you are not interested in Colonoscopy screening (if done and normal you could be cleared for 5 to 10 years until next due), then Cologuard is an excellent alternative for screening test for Colon Cancer. It is highly sensitive for detecting DNA of colon cancer from even the earliest stages. Also, there is NO bowel prep required. - If Cologuard is NEGATIVE, then it is good for 3 years before next due - If Cologuard is POSITIVE, then it is strongly advised to get a Colonoscopy, which allows the GI doctor to locate the source of the cancer or polyp (even very early stage) and treat it by removing it. ------------------------- If you would like to proceed with Cologuard (stool DNA test) - FIRST, call your insurance company and tell them you want to check cost of Cologuard tell them CPT Code 42595 (it may be completely covered and you could get for no cost, OR max cost without any coverage is about $600). Also, keep in mind if you do NOT open the kit, and decide not to do the test, you will NOT be charged, you should contact the company if you decide not to do the test. - If you want to proceed, you can notify us  (phone message, MyChart Message, or at next visit) and we will order it for you. The test kit will be delivered to you house within about 1 week. Follow instructions to collect sample, you may call the company for any help or questions, 24/7 telephone support at [phone].    Please schedule a Follow-up Appointment to: No follow-ups on  file.  If you have any other questions or concerns, please feel free to call the office or send a message through MyChart. You may also schedule an earlier appointment if necessary.  Additionally, you may be receiving a survey about your experience at our office within a few days to 1 week by e-mail or mail. We value your feedback.  Domingo Friend, DO [facility], [place]

## 2024-03-30 NOTE — Progress Notes (Signed)
 Subjective:    Patient ID: Tanner Frye, male    DOB: [birth year], 48 y.o.   MRN: [id number]  Tanner Frye is a 48 y.o. male presenting on 03/30/2024 for Annual Exam   HPI  Discussed the use of AI scribe software for clinical note transcription with the patient, who gave verbal consent to proceed.  History of Present Illness   Tanner Frye is a 48 year old male who presents for an annual physical exam.  He is scheduled for fasting blood work today. His last annual exam was in June 2024, and he had cardiology-related blood work in March 2025.  He is currently taking atorvastatin  40 mg for cholesterol management and hydrochlorothiazide  for blood pressure, which is due for a refill. He previously used pravastatin , and his cholesterol levels improved significantly from December 2024 to March 2025.  He has experienced weight fluctuations, reaching up to 270 pounds due to headaches and inactivity, but has recently lost about six pounds. He aims to reach 220 pounds through fasting, a high-protein diet, and regular exercise.  He previously received testosterone injections from a clinic in [place] but switched to a clinic in [place]  recommended by his brother. He has been on this new regimen for a week and a half. He also takes a multivitamin, B12, and anastrozole, a hormone blocker.  No digestive issues, fluid retention, urinary difficulties, or nocturia.      Weight Fluctuation Fasting diet, inc protein Working out Lost 6 lbs recently   CHRONIC DM, Type 2 BMI >32 Followed by Dr Basilia [facility] A1c 5.8 previously  CBGs: avg well controlled Meds: Ozempic  0.5mg  weekly he plans to reduce or come off of this in future On ARB Last urine microalbumin negative 02/2022, due for repeat, next apt with Endocrine 04/07/23 Last DM Eye Exam 08/2023 Lifestyle: - Diet (improving low carb low sugar)  Denies hypoglycemia, polyuria, visual changes, numbness or tingling.    HYPERLIPIDEMIA / Elevated LFTs. Normalized LFTs. Controlled on Atorvastatin  40mg . Previously on Pravastatin .   Hypogonadism Low T Followed by clinic in [place] He is on testosterone injections Improved muscle mass   HYPERTENSION His goal is to come off his BP medication Current Meds - HCTZ 25mg  daily, Losartan  100mg  daily   Reports good compliance, took meds today. Tolerating well, w/o complaints. Denies CP, dyspnea, HA, edema, dizziness / lightheadedness     Health Maintenance:  Last Colonoscopy 04/24/22 - negative, next due 04/2027, he will follow up with GI sooner most likely due to fam history of cancer.  Tdap     03/30/2024    8:17 AM 08/11/2023    2:10 PM 03/31/2023    9:05 AM  Depression screen PHQ 2/9  Decreased Interest 0 0 0  Down, Depressed, Hopeless 0 0 0  PHQ - 2 Score 0 0 0  Altered sleeping 0 0 0  Tired, decreased energy 0 0 0  Change in appetite 0 0 0  Feeling bad or failure about yourself  0 0 0  Trouble concentrating 0 0 0  Moving slowly or fidgety/restless 0 0 0  Suicidal thoughts 0 0 0  PHQ-9 Score 0 0 0  Difficult doing work/chores  Not difficult at all Not difficult at all       03/30/2024    8:17 AM 08/11/2023    2:10 PM 03/31/2023    9:05 AM 04/27/2022    9:23 AM  GAD 7 : Generalized Anxiety Score  Nervous, Anxious, on Edge 0  0 0 0  Control/stop worrying 0 0 0 0  Worry too much - different things 0 0 0 0  Trouble relaxing 0 0 0 0  Restless 0 0 0 0  Easily annoyed or irritable 0 0 0 0  Afraid - awful might happen 0 0 0 0  Total GAD 7 Score 0 0 0 0  Anxiety Difficulty   Not difficult at all Not difficult at all     Past Medical History:  Diagnosis Date   Allergy    Anal fissure    Diabetes mellitus without complication (HCC) 2021   Family history of colorectal cancer    GERD (gastroesophageal reflux disease)    Hypertension 2021   Mixed hyperlipidemia 09/23/2022   Personal history of colonic polyps    Type 2 diabetes, controlled, with  peripheral neuropathy (HCC)    Past Surgical History:  Procedure Laterality Date   COLONOSCOPY WITH PROPOFOL  N/A 04/27/2017   Procedure: COLONOSCOPY WITH PROPOFOL ;  Surgeon: Luke Salaam, MD;  Location: [facility] ENDOSCOPY;  Service: Endoscopy;  Laterality: N/A;   COLONOSCOPY WITH PROPOFOL  N/A 06/21/2020   Procedure: COLONOSCOPY WITH PROPOFOL ;  Surgeon: Luke Salaam, MD;  Location: [facility] ENDOSCOPY;  Service: Gastroenterology;  Laterality: N/A;   COLONOSCOPY WITH PROPOFOL  N/A 04/24/2022   Procedure: COLONOSCOPY WITH PROPOFOL ;  Surgeon: Luke Salaam, MD;  Location: [facility] ENDOSCOPY;  Service: Gastroenterology;  Laterality: N/A;   HERNIA REPAIR  1980   Social History   Socioeconomic History   Marital status: Married    Spouse name: Not on file   Number of children: Not on file   Years of education: Vocational Degree   Highest education level: Some college, no degree  Occupational History   Occupation: Psychologist, sport and exercise  Tobacco Use   Smoking status: Never   Smokeless tobacco: Never  Vaping Use   Vaping status: Never Used  Substance and Sexual Activity   Alcohol use: Yes    Alcohol/week: 0.0 standard drinks of alcohol    Comment: Wine or beer 1 -2  once or twice a week.   Drug use: No   Sexual activity: Yes    Partners: Female    Birth control/protection: Surgical  Other Topics Concern   Not on file  Social History Narrative   Right Handed    Lives in a two story home   Social Drivers of Health   Financial Resource Strain: Low Risk  (03/29/2024)   Overall Financial Resource Strain (CARDIA)    Difficulty of Paying Living Expenses: Not hard at all  Food Insecurity: No Food Insecurity (03/29/2024)   Hunger Vital Sign    Worried About Running Out of Food in the Last Year: Never true    Ran Out of Food in the Last Year: Never true  Transportation Needs: No Transportation Needs (03/29/2024)   PRAPARE - Administrator, Civil Service (Medical): No    Lack of Transportation  (Non-Medical): No  Physical Activity: Sufficiently Active (03/29/2024)   Exercise Vital Sign    Days of Exercise per Week: 4 days    Minutes of Exercise per Session: 60 min  Stress: No Stress Concern Present (03/29/2024)   [employer] of Occupational Health - Occupational Stress Questionnaire    Feeling of Stress : Not at all  Social Connections: Moderately Isolated (03/29/2024)   Social Connection and Isolation Panel    Frequency of Communication with Friends and Family: Twice a week    Frequency of Social Gatherings with Friends and Family:  Twice a week    Attends Religious Services: Never    Active Member of Clubs or Organizations: No    Attends Engineer, structural: Not on file    Marital Status: Married  Catering manager Violence: Not on file   Family History  Problem Relation Age of Onset   Hypertension Mother    Hyperlipidemia Mother    Rectal cancer Brother 37   Cancer Brother    Rectal cancer Paternal Aunt    Colon cancer Paternal Uncle    Prostate cancer Neg Hx    Current Outpatient Medications on File Prior to Visit  Medication Sig   amLODipine  (NORVASC ) 5 MG tablet Take 1 tablet (5 mg total) by mouth daily.   ANASTROZOLE PO    Ascorbic Acid (VITAMIN C) 1000 MG tablet Take 1,000 mg by mouth daily.   atorvastatin  (LIPITOR) 40 MG tablet Take 1 tablet (40 mg total) by mouth daily.   Blood Glucose Monitoring Suppl (FREESTYLE LITE) DEVI Use to check blood sugar up to twice a day as advised.   Enclomiphene Citrate POWD    EPINEPHrine  (EPIPEN  2-PAK) 0.3 mg/0.3 mL IJ SOAJ injection Inject 0.3 mg into the muscle as needed for anaphylaxis.   FREESTYLE LITE test strip Use to check blood sugar up to twice a day as advised.   Lancets (FREESTYLE) lancets Use to check blood sugar up to twice a day as advised.   Loratadine 10 MG CAPS    losartan  (COZAAR ) 100 MG tablet Take 1 tablet (100 mg total) by mouth daily.   Semaglutide ,0.25 or 0.5MG /DOS, 2 MG/3ML SOPN Inject 0.5  mg under the skin every week.   Testosterone Cypionate 200 MG/ML SOLN    aspirin 81 MG chewable tablet    cyclobenzaprine  (FLEXERIL ) 5 MG tablet Take 1 tablet (5 mg total) by mouth 2 (two) times daily as needed for muscle spasms (neck pain/headache).   Multiple Vitamin (MULTI-VITAMIN) tablet    No current facility-administered medications on file prior to visit.    Review of Systems  Constitutional:  Negative for activity change, appetite change, chills, diaphoresis, fatigue and fever.  HENT:  Negative for congestion and hearing loss.   Eyes:  Negative for visual disturbance.  Respiratory:  Negative for cough, chest tightness, shortness of breath and wheezing.   Cardiovascular:  Negative for chest pain, palpitations and leg swelling.  Gastrointestinal:  Negative for abdominal pain, constipation, diarrhea, nausea and vomiting.  Genitourinary:  Negative for dysuria, frequency and hematuria.  Musculoskeletal:  Negative for arthralgias and neck pain.  Skin:  Negative for rash.  Neurological:  Negative for dizziness, weakness, light-headedness, numbness and headaches.  Hematological:  Negative for adenopathy.  Psychiatric/Behavioral:  Negative for behavioral problems, dysphoric mood and sleep disturbance.    Per HPI unless specifically indicated above     Objective:    BP 110/70 (BP Location: Left Arm, Patient Position: Sitting, Cuff Size: Large)   Pulse 78   Ht 6' 4 (1.93 m)   Wt 264 lb (119.7 kg)   SpO2 97%   BMI 32.14 kg/m   Wt Readings from Last 3 Encounters:  03/30/24 264 lb (119.7 kg)  01/26/24 269 lb (122 kg)  12/28/23 268 lb 12.8 oz (121.9 kg)    Physical Exam Vitals and nursing note reviewed.  Constitutional:      General: He is not in acute distress.    Appearance: He is well-developed. He is not diaphoretic.     Comments: Well-appearing, comfortable, cooperative  HENT:  Head: Normocephalic and atraumatic.   Eyes:     General:        Right eye: No  discharge.        Left eye: No discharge.     Conjunctiva/sclera: Conjunctivae normal.     Pupils: Pupils are equal, round, and reactive to light.   Neck:     Thyroid : No thyromegaly.   Cardiovascular:     Rate and Rhythm: Normal rate and regular rhythm.     Pulses: Normal pulses.     Heart sounds: Normal heart sounds. No murmur heard. Pulmonary:     Effort: Pulmonary effort is normal. No respiratory distress.     Breath sounds: Normal breath sounds. No wheezing or rales.  Abdominal:     General: Bowel sounds are normal. There is no distension.     Palpations: Abdomen is soft. There is no mass.     Tenderness: There is no abdominal tenderness.   Musculoskeletal:        General: No tenderness. Normal range of motion.     Cervical back: Normal range of motion and neck supple.     Comments: Upper / Lower Extremities: - Normal muscle tone, strength bilateral upper extremities 5/5, lower extremities 5/5  Lymphadenopathy:     Cervical: No cervical adenopathy.   Skin:    General: Skin is warm and dry.     Findings: No erythema or rash.   Neurological:     Mental Status: He is alert and oriented to person, place, and time.     Comments: Distal sensation intact to light touch all extremities  Psychiatric:        Mood and Affect: Mood normal.        Behavior: Behavior normal.        Thought Content: Thought content normal.     Comments: Well groomed, good eye contact, normal speech and thoughts     Diabetic Foot Exam - Simple   Simple Foot Form Diabetic Foot exam was performed with the following findings: Yes 03/30/2024  8:25 AM  Visual Inspection No deformities, no ulcerations, no other skin breakdown bilaterally: Yes Sensation Testing Intact to touch and monofilament testing bilaterally: Yes Pulse Check Posterior Tibialis and Dorsalis pulse intact bilaterally: Yes Comments      Results for orders placed or performed in visit on 12/28/23  Hepatic function panel    Collection Time: 12/28/23  9:41 AM  Result Value Ref Range   Total Protein 6.9 6.0 - 8.5 g/dL   Albumin 4.6 4.1 - 5.1 g/dL   Bilirubin Total 0.6 0.0 - 1.2 mg/dL   Bilirubin, Direct 1.61 0.00 - 0.40 mg/dL   Alkaline Phosphatase 48 44 - 121 IU/L   AST 37 0 - 40 IU/L   ALT 35 0 - 44 IU/L  LDL cholesterol, direct   Collection Time: 12/28/23  9:41 AM  Result Value Ref Range   LDL Direct 103 (H) 0 - 99 mg/dL  Lipid panel   Collection Time: 12/28/23  9:41 AM  Result Value Ref Range   Cholesterol, Total 179 100 - 199 mg/dL   Triglycerides 096 (H) 0 - 149 mg/dL   HDL 45 >04 mg/dL   VLDL Cholesterol Cal 58 (H) 5 - 40 mg/dL   LDL Chol Calc (NIH) 76 0 - 99 mg/dL   LDL CALC COMMENT: Comment    Chol/HDL Ratio 4.0 0.0 - 5.0 ratio      Assessment & Plan:   Problem List Items Addressed  This Visit     Essential hypertension   Relevant Medications   aspirin 81 MG chewable tablet   hydrochlorothiazide  (HYDRODIURIL ) 25 MG tablet   Other Relevant Orders   TSH   Hyperlipidemia associated with type 2 diabetes mellitus (HCC)   Relevant Medications   aspirin 81 MG chewable tablet   hydrochlorothiazide  (HYDRODIURIL ) 25 MG tablet   Other Relevant Orders   COMPLETE METABOLIC PANEL WITHOUT GFR   TSH   Type 2 diabetes mellitus with other specified complication (HCC)   Relevant Medications   aspirin 81 MG chewable tablet   Other Relevant Orders   Hemoglobin A1c   Other Visit Diagnoses       Annual physical exam    -  Primary   Relevant Orders   CBC with Differential/Platelet   Hemoglobin A1c   COMPLETE METABOLIC PANEL WITHOUT GFR   PSA   TSH     Need for diphtheria-tetanus-pertussis (Tdap) vaccine       Relevant Orders   Tdap vaccine greater than or equal to 7yo IM (Completed)     Screening for prostate cancer       Relevant Orders   PSA     Long-term current use of injectable noninsulin antidiabetic medication            Updated Health Maintenance information Fasting lab order  today Encouraged improvement to lifestyle with diet and exercise Goal of weight loss  Annual Health Examination Blood pressure controlled. Weight decreased by six pounds. Current weight management includes fasting, high protein diet, and exercise. - Order blood work: CBC, glucose, chemistry panel, renal function, liver function, PSA, TSH. - Administer tetanus (TDAP) vaccine. - Refill hydrochlorothiazide  prescription.  Type 2 Diabetes Mellitus Controlled  previously A1c 5 range Future plans to DC GLP1 ozempic , he is following with Endocrinology - Order A1c test.  Hypertension Blood pressure controlled with hydrochlorothiazide . - Refill hydrochlorothiazide  prescription.  Hyperlipidemia Cholesterol levels improved with atorvastatin  40 mg. No changes since March 2025. - Defer Lipid since it was recently re-checked.  Testosterone Replacement Therapy Switched to [place]  clinic, virtual management and labs. On Testosterone injections. Regular blood donation to manage hematocrit.  General Health Maintenance Colonoscopy up to date, next planned for July 2026. Not eligible for pneumonia vaccine. Continues multivitamins and B12. - Administer tetanus (TDAP) vaccine. - Schedule Cologuard test for July 2026. Anticipated given his preference for q 3 year for high risk fam history of colon cancer.  Follow-up Follow-up includes reviewing blood work results on MyChart. - Review blood work results on [employer]. - Schedule next annual examination avoiding Monday and Thursday.        Orders Placed This Encounter  Procedures   Tdap vaccine greater than or equal to 7yo IM   CBC with Differential/Platelet   Hemoglobin A1c   COMPLETE METABOLIC PANEL WITHOUT GFR   PSA   TSH    Meds ordered this encounter  Medications   hydrochlorothiazide  (HYDRODIURIL ) 25 MG tablet    Sig: Take 1 tablet (25 mg total) by mouth daily.    Dispense:  90 tablet    Refill:  3     Follow up plan: Return in  about 1 year (around 03/30/2025).  Domingo Friend, DO [facility] 03/30/2024, 8:19 AM

## 2024-03-31 DIAGNOSIS — E119 Type 2 diabetes mellitus without complications: Secondary | ICD-10-CM | POA: Diagnosis not present

## 2024-03-31 LAB — HEMOGLOBIN A1C
Hgb A1c MFr Bld: 5.6 % (ref <5.7)
Mean Plasma Glucose: 114 mg/dL
eAG (mmol/L): 6.3 mmol/L

## 2024-03-31 LAB — COMPLETE METABOLIC PANEL WITHOUT GFR
AG Ratio: 1.7 (calc) (ref 1.0–2.5)
ALT: 47 U/L — ABNORMAL HIGH (ref 9–46)
AST: 125 U/L — ABNORMAL HIGH (ref 10–40)
Albumin: 4.5 g/dL (ref 3.6–5.1)
Alkaline phosphatase (APISO): 39 U/L (ref 36–130)
BUN: 13 mg/dL (ref 7–25)
CO2: 28 mmol/L (ref 20–32)
Calcium: 9.5 mg/dL (ref 8.6–10.3)
Chloride: 102 mmol/L (ref 98–110)
Creat: 1.08 mg/dL (ref 0.60–1.29)
Globulin: 2.6 g/dL (ref 1.9–3.7)
Glucose, Bld: 103 mg/dL — ABNORMAL HIGH (ref 65–99)
Potassium: 4.9 mmol/L (ref 3.5–5.3)
Sodium: 137 mmol/L (ref 135–146)
Total Bilirubin: 1.2 mg/dL (ref 0.2–1.2)
Total Protein: 7.1 g/dL (ref 6.1–8.1)

## 2024-03-31 LAB — CBC WITH DIFFERENTIAL/PLATELET
Absolute Lymphocytes: 2489 {cells}/uL (ref 850–3900)
Absolute Monocytes: 762 {cells}/uL (ref 200–950)
Basophils Absolute: 48 {cells}/uL (ref 0–200)
Basophils Relative: 0.7 %
Eosinophils Absolute: 88 {cells}/uL (ref 15–500)
Eosinophils Relative: 1.3 %
HCT: 40.9 % (ref 38.5–50.0)
Hemoglobin: 13.1 g/dL — ABNORMAL LOW (ref 13.2–17.1)
MCH: 27.8 pg (ref 27.0–33.0)
MCHC: 32 g/dL (ref 32.0–36.0)
MCV: 86.7 fL (ref 80.0–100.0)
MPV: 9.5 fL (ref 7.5–12.5)
Monocytes Relative: 11.2 %
Neutro Abs: 3414 {cells}/uL (ref 1500–7800)
Neutrophils Relative %: 50.2 %
Platelets: 280 10*3/uL (ref 140–400)
RBC: 4.72 10*6/uL (ref 4.20–5.80)
RDW: 13.2 % (ref 11.0–15.0)
Total Lymphocyte: 36.6 %
WBC: 6.8 10*3/uL (ref 3.8–10.8)

## 2024-03-31 LAB — TSH: TSH: 2.69 m[IU]/L (ref 0.40–4.50)

## 2024-03-31 LAB — PSA: PSA: 0.54 ng/mL (ref <=4.00)

## 2024-04-05 ENCOUNTER — Ambulatory Visit: Payer: Self-pay | Admitting: Family Medicine

## 2024-04-07 ENCOUNTER — Ambulatory Visit: Payer: BC Managed Care – PPO | Admitting: Internal Medicine

## 2024-04-07 ENCOUNTER — Encounter: Payer: Self-pay | Admitting: Internal Medicine

## 2024-04-07 VITALS — BP 120/78 | HR 80 | Ht 76.0 in | Wt 264.6 lb

## 2024-04-07 DIAGNOSIS — Z7985 Long-term (current) use of injectable non-insulin antidiabetic drugs: Secondary | ICD-10-CM

## 2024-04-07 DIAGNOSIS — E1142 Type 2 diabetes mellitus with diabetic polyneuropathy: Secondary | ICD-10-CM

## 2024-04-07 DIAGNOSIS — E782 Mixed hyperlipidemia: Secondary | ICD-10-CM | POA: Diagnosis not present

## 2024-04-07 DIAGNOSIS — G6289 Other specified polyneuropathies: Secondary | ICD-10-CM | POA: Diagnosis not present

## 2024-04-07 MED ORDER — TIRZEPATIDE 2.5 MG/0.5ML ~~LOC~~ SOAJ: 2.5000 mg | SUBCUTANEOUS | 2 refills | Status: DC

## 2024-04-07 NOTE — Progress Notes (Signed)
 Patient ID: Tanner Frye, male   DOB: [birth year], 48 y.o.   MRN: [id number]   HPI: Tanner Frye is a 48 y.o.-year-old male, initially referred by his PCP, Tanner Frye *, returning for follow-up for DM2, dx in 2021, controlled, with complications (peripheral neuropathy).  Last visit 6 months ago.  Interim hx: No increased urination, blurry vision, nausea, chest pain. Is frustrated about the lack of weight loss and wonders about other medication options besides Ozempic .  Reviewed HbA1c: Lab Results  Component Value Date   HGBA1C 5.6 03/30/2024   HGBA1C 5.8 (A) 04/07/2023   HGBA1C 5.0 09/23/2022   HGBA1C 5.3 03/18/2022   HGBA1C 5.8 (A) 08/26/2021   HGBA1C 5.5 02/19/2021   HGBA1C 6.2 (A) 11/20/2020   HGBA1C 6.0 (A) 08/01/2020   HGBA1C 8.9 (H) 01/26/2020   HGBA1C 6.8 (H) 03/29/2019   He was previously on Ozempic  0.5 mg weekly - started 08/2021 >> 1 mg weekly >> stopped 12/2022 losing muscle and strength.  However, he wanted to retry this - restarted 11/2023: 0.25 >> 0.5 mg weekly He was also previously on metformin  500 mg 2x a day with meals but he felt this was not working well for him.  Pt checks his sugars 0-1x a day: - am: 110s >> 80s-100 >> 100-120 >> 100-110 >> 100 - 2h after b'fast:139 >> n/c >> 130 >> n/c >> 130-140 - before lunch: n/c >> 123 >> 90s >> n/c >> 100-110 >> 100 - 2h after lunch: n/c >> 101 >> n/c >> 130 >> n/c - before dinner:  90s >> less than 100 >> 80 >> 80-100 - 2h after dinner: n/c >> 105 >> 115-120 >> n/c - bedtime: n/c >> 96 >> n/c >> 90s-105  >> n/c - nighttime: n/c Lowest sugar was  75 >> 80; it is unclear if he has hypoglycemia awareness. Highest sugar was 139 >> 120 >> 110.  Glucometer: Freestyle lite  Pt's meals are: - Breakfast: 2 sugar-free jello cups + 2 eggs + 2 pieces of bacon >> oatmeal - Lunch: prev. Fast food >> now home cooked: chicken + veggies - Dinner: same - Snacks: no desserts, apples, carrots, crackers. No sweet  drinks, but water, unsweet tea. Used to drink OJ. He saw nutrition at [facility] in the past -he felt that this was helpful.  - no CKD, last BUN/creatinine:  Lab Results  Component Value Date   BUN 13 03/30/2024   BUN 15 08/09/2023   CREATININE 1.08 03/30/2024   CREATININE 1.21 08/09/2023   Lab Results  Component Value Date   MICRALBCREAT 2 10/08/2023   MICRALBCREAT 0.8 03/18/2022  On losartan  100.  -+ HL; last set of lipids: Lab Results  Component Value Date   CHOL 179 12/28/2023   HDL 45 12/28/2023   LDLCALC 76 12/28/2023   LDLDIRECT 103 (H) 12/28/2023   TRIG 360 (H) 12/28/2023   CHOLHDL 4.0 12/28/2023  Started pravastatin  20 mg daily 09/2022.  This was increased to 40 mg daily in 09/2023. Of note, he had a coronary calcium  score of 0.  - last eye exam was in 08/2023: No DR  - + numbness and tingling in his feet. No pain. Started mid 2021.  Last foot exam 03/30/2024.  At that time, it was considered that his neuropathy was related to diabetes. Since he only had numbness at that time, no treatment recommendation was made since none of the available medications help in the absence of pain. Alpha-lipoic acid twice a day  Lyrica  did not help significantly. Also, L-me-folate did not help.  Exercise helps and his symptoms are increased if he takes a break from exercising for more than 2 to 3 days.   Was on Cayenne fruit >> helped the most but had to stop due to GERD. At last visit, he was using Lions mane and Cordyceps >> stopped due to headaches.  Retrospectively, these did not help too much.  Pt has FH of prediabetes in M.  He also has a history of transaminitis (increased ALT) and also HTN.  At last visit, we investigated his peripheral neuropathy-B6 vitamin was high, but normalized after stopping multivitamins: Office Visit on 02/19/2021  Component Date Value Ref Range Status   Vitamin B6 02/19/2021 15.0  2.1 - 21.7 ng/mL Final   Office Visit on 11/20/2020  Component Date Value  Ref Range Status   TSH 11/20/2020 1.98  0.35 - 4.50 uIU/mL Final   Vitamin B-12 11/20/2020 537  211 - 911 pg/mL Final   Vitamin B6 11/20/2020 42.0* 2.1 - 21.7 ng/mL Final   Ferritin 11/20/2020 49.9  22.0 - 322.0 ng/mL Final   He is on testosterone supplementation by [facility] MD. His Hb was previously elevated. He donates blood.  ROS: + see HPI  I reviewed pt's medications, allergies, PMH, social hx, family hx, and changes were documented in the history of present illness. Otherwise, unchanged from my initial visit note.  Past Medical History:  Diagnosis Date   Allergy    Anal fissure    Diabetes mellitus without complication (HCC) 2021   Family history of colorectal cancer    GERD (gastroesophageal reflux disease)    Hypertension 2021   Mixed hyperlipidemia 09/23/2022   Personal history of colonic polyps    Type 2 diabetes, controlled, with peripheral neuropathy [facility])    Past Surgical History:  Procedure Laterality Date   COLONOSCOPY WITH PROPOFOL  N/A 04/27/2017   Procedure: COLONOSCOPY WITH PROPOFOL ;  Surgeon: Tanner Salaam, MD;  Location: [facility] ENDOSCOPY;  Service: Endoscopy;  Laterality: N/A;   COLONOSCOPY WITH PROPOFOL  N/A 06/21/2020   Procedure: COLONOSCOPY WITH PROPOFOL ;  Surgeon: Tanner Salaam, MD;  Location: [facility] ENDOSCOPY;  Service: Gastroenterology;  Laterality: N/A;   COLONOSCOPY WITH PROPOFOL  N/A 04/24/2022   Procedure: COLONOSCOPY WITH PROPOFOL ;  Surgeon: Tanner Salaam, MD;  Location: [facility] ENDOSCOPY;  Service: Gastroenterology;  Laterality: N/A;   HERNIA REPAIR  1980   Social History   Socioeconomic History   Marital status: Married    Spouse name: Not on file   Number of children: Not on file   Years of education: Vocational Degree   Highest education level: Some college, no degree  Occupational History   Occupation: Psychologist, sport and exercise  Tobacco Use   Smoking status: Never   Smokeless tobacco: Never  Vaping Use   Vaping status: Never Used  Substance and Sexual Activity    Alcohol use: Yes    Alcohol/week: 0.0 standard drinks of alcohol    Comment: Wine or beer 1 -2  once or twice a week.   Drug use: No   Sexual activity: Yes    Partners: Female    Birth control/protection: Surgical  Other Topics Concern   Not on file  Social History Narrative   Right Handed    Lives in a two story home   Social Drivers of Health   Financial Resource Strain: Low Risk  (03/29/2024)   Overall Financial Resource Strain (CARDIA)    Difficulty of Paying Living Expenses: Not hard at  all  Food Insecurity: No Food Insecurity (03/29/2024)   Hunger Vital Sign    Worried About Running Out of Food in the Last Year: Never true    Ran Out of Food in the Last Year: Never true  Transportation Needs: No Transportation Needs (03/29/2024)   PRAPARE - Administrator, Civil Service (Medical): No    Lack of Transportation (Non-Medical): No  Physical Activity: Sufficiently Active (03/29/2024)   Exercise Vital Sign    Days of Exercise per Week: 4 days    Minutes of Exercise per Session: 60 min  Stress: No Stress Concern Present (03/29/2024)   [employer] of Occupational Health - Occupational Stress Questionnaire    Feeling of Stress : Not at all  Social Connections: Moderately Isolated (03/29/2024)   Social Connection and Isolation Panel    Frequency of Communication with Friends and Family: Twice a week    Frequency of Social Gatherings with Friends and Family: Twice a week    Attends Religious Services: Never    Database administrator or Organizations: No    Attends Engineer, structural: Not on file    Marital Status: Married  Catering manager Violence: Not on file   Current Outpatient Medications on File Prior to Visit  Medication Sig Dispense Refill   amLODipine  (NORVASC ) 5 MG tablet Take 1 tablet (5 mg total) by mouth daily. 90 tablet 3   ANASTROZOLE PO      Ascorbic Acid (VITAMIN C) 1000 MG tablet Take 1,000 mg by mouth daily.     aspirin 81 MG  chewable tablet      atorvastatin  (LIPITOR) 40 MG tablet Take 1 tablet (40 mg total) by mouth daily. 90 tablet 3   Blood Glucose Monitoring Suppl (FREESTYLE LITE) DEVI Use to check blood sugar up to twice a day as advised. 1 each 0   cyclobenzaprine  (FLEXERIL ) 5 MG tablet Take 1 tablet (5 mg total) by mouth 2 (two) times daily as needed for muscle spasms (neck pain/headache). 40 tablet 3   Enclomiphene Citrate POWD      EPINEPHrine  (EPIPEN  2-PAK) 0.3 mg/0.3 mL IJ SOAJ injection Inject 0.3 mg into the muscle as needed for anaphylaxis. 1 each 1   FREESTYLE LITE test strip Use to check blood sugar up to twice a day as advised. 200 each 3   hydrochlorothiazide  (HYDRODIURIL ) 25 MG tablet Take 1 tablet (25 mg total) by mouth daily. 90 tablet 3   Lancets (FREESTYLE) lancets Use to check blood sugar up to twice a day as advised. 100 each 12   Loratadine 10 MG CAPS      losartan  (COZAAR ) 100 MG tablet Take 1 tablet (100 mg total) by mouth daily. 90 tablet 3   Multiple Vitamin (MULTI-VITAMIN) tablet      Semaglutide ,0.25 or 0.5MG /DOS, 2 MG/3ML SOPN Inject 0.5 mg under the skin every week. 9 mL 1   Testosterone Cypionate 200 MG/ML SOLN      No current facility-administered medications on file prior to visit.   Allergies  Allergen Reactions   Bee Venom    Oatmeal Swelling   Other Swelling    Processed foods that are baked   Family History  Problem Relation Age of Onset   Hypertension Mother    Hyperlipidemia Mother    Rectal cancer Brother 55   Cancer Brother    Rectal cancer Paternal Aunt    Colon cancer Paternal Uncle    Prostate cancer Neg Hx  PE: BP 120/78   Pulse 80   Ht 6' 4 (1.93 m)   Wt 264 lb 9.6 oz (120 kg)   SpO2 96%   BMI 32.21 kg/m  Wt Readings from Last 10 Encounters:  04/07/24 264 lb 9.6 oz (120 kg)  03/30/24 264 lb (119.7 kg)  01/26/24 269 lb (122 kg)  12/28/23 268 lb 12.8 oz (121.9 kg)  11/23/23 263 lb (119.3 kg)  10/08/23 264 lb 9.6 oz (120 kg)  09/23/23 257  lb 3.2 oz (116.7 kg)  08/11/23 254 lb (115.2 kg)  08/09/23 251 lb 6.4 oz (114 kg)  04/07/23 254 lb (115.2 kg)   Constitutional: Muscular, in NAD Eyes: EOMI, no exophthalmos ENT: no thyromegaly, no cervical lymphadenopathy Cardiovascular: RRR, No MRG Respiratory: CTA B Musculoskeletal: no deformities Skin: no rashes Neurological: no tremor with outstretched hands  ASSESSMENT: 1. DM2, non-insulin-dependent, controlled, controlled, with complications - PN  2. HL  3.  Peripheral neuropathy  PLAN:  1. Patient with history of prediabetes, but with a diagnosis of diabetes in 2021.  At that time, HbA1c returned elevated, at 8.9%, but blood sugars improved afterwards to a nadir HbA1c of 5.0%.  At last visit HbA1c was 5.9%, slightly higher, but he had another HbA1c obtained a week ago and this was lower, at 5.6%.  At last visit, sugars were at goal.  He was inquiring about Ozempic  but I recommended against it in the setting of recent dehydration, increased hemoglobin, posterior headaches and pending further neurology investigation.  However afterwards, he contacted me with higher blood sugars and requesting Ozempic .  Since he symptoms were attributed to food poisoning and they had resolved, we ended up starting low-dose Ozempic .  He tolerated well.  He did not lose weight yet.  At today's visit, he would be interested to try a stronger medication for weight loss.  We discussed about possibly increasing the Ozempic  dose, but he is more interested in switching to Mounjaro.  Will start at a low dose and increase as tolerated.  I did advise him that the side effects are similar to those of Ozempic .  If not covered by his insurance, we discussed about going up on the Ozempic  dose.   - I suggested to:  Patient Instructions  You can try to change from Ozempic  to: - Mounjaro 2.5 mg weekly. Please let me know if we can increase the dose in 1 month.  Please come back for a follow-up appointment in 6  months.  - advised to check sugars at different times of the day - 1x a day, rotating check times - advised for yearly eye exams >> he is UTD - return to clinic in 6 months  2. HL - Latest lipid panel showed high triglycerides and elevated direct LDL: Lab Results  Component Value Date   CHOL 179 12/28/2023   HDL 45 12/28/2023   LDLCALC 76 12/28/2023   LDLDIRECT 103 (H) 12/28/2023   TRIG 360 (H) 12/28/2023   CHOLHDL 4.0 12/28/2023  - He is on atorvastatin  40 mg daily, with good tolerance.  I also suggested to add omega-3 fatty acids: fish oil  1000 mg twice a day.  3. Peripheral neuropathy -Developed around the time when his diabetes became uncontrolled but his diabetes control improved afterwards with persistent neuropathy symptoms. He described no sensation in the front part of his sole and increase sensitivity mid foot. -Of note, his brother also developed neuropathy, but after chemotherapy -Symptoms improve with exercising  -Usually he has numbness,  but without discomfort -He tried gabapentin , alpha lipoid acid, L-methylfolate without any improvement in symptoms.  We tried Lyrica  but this did not work for him. -B6 level was high so we stopped his multivitamins but then normalized B6 level afterwards did not help with symptoms -B12 vitamin and ferritin levels were normal -No history of excess alcohol or toxic exposures. -He started to see neurology-Dr. Lydia Sams.  His neuropathy was still deemed to be due to diabetes despite the mild nature of his diabetes.  Since he only had some numbness, only conservative measures were recommended. - Last year, he started to use cayenne fruit, and also [employer] and Cordyceps. He came off the mushrooms due to headaches, though.  He does mention that cayenne fruit helps the most but he had to stop due to GERD.  I recommended capsaicin cream.  Emilie Harden, MD PhD [facility] Endocrinology

## 2024-04-07 NOTE — Patient Instructions (Addendum)
 You can try to change from Ozempic , - Mounjaro 2.5 mg weekly. Please let me know if we can increase the dose in 1 month.  Please come back for a follow-up appointment in 6 months.

## 2024-04-12 ENCOUNTER — Telehealth: Payer: Self-pay | Admitting: Cardiovascular Disease

## 2024-04-12 NOTE — Telephone Encounter (Signed)
 Call placed to pt, he is aware that we sent a rx to [facility] for 1 year and he can have [facility] call them to have it transferred over to them or can call [facility] to have them transfer it over.  Pt verbalized understanding and will call and have them send it over.

## 2024-04-12 NOTE — Telephone Encounter (Signed)
*  STAT* If patient is at the pharmacy, call can be transferred to refill team.   1. Which medications need to be refilled? (please list name of each medication and dose if known) losartan  (COZAAR ) 100 MG tablet   2. Which pharmacy/location (including street and city if local pharmacy) is medication to be sent to?  [facility] - [place], [place] - [address]    3. Do they need a 30 day or 90 day supply? 90

## 2024-04-28 ENCOUNTER — Encounter: Payer: Self-pay | Admitting: Internal Medicine

## 2024-05-02 ENCOUNTER — Telehealth: Payer: Self-pay

## 2024-05-02 ENCOUNTER — Ambulatory Visit

## 2024-05-02 DIAGNOSIS — M5481 Occipital neuralgia: Secondary | ICD-10-CM | POA: Insufficient documentation

## 2024-05-02 DIAGNOSIS — G930 Cerebral cysts: Secondary | ICD-10-CM | POA: Insufficient documentation

## 2024-05-02 DIAGNOSIS — M542 Cervicalgia: Secondary | ICD-10-CM | POA: Diagnosis not present

## 2024-05-02 DIAGNOSIS — S46812A Strain of other muscles, fascia and tendons at shoulder and upper arm level, left arm, initial encounter: Secondary | ICD-10-CM | POA: Diagnosis not present

## 2024-05-02 NOTE — Telephone Encounter (Signed)
 Pharmacy Patient Advocate Encounter   Received notification from Pt Calls Messages that prior authorization for Mounjaro  2.5mg  is required/requested.   Insurance verification completed.   The patient is insured through [facility] .   Per test claim: PA required; PA submitted to above mentioned insurance via CoverMyMeds Key/confirmation #/EOC  [facility] Status is pending

## 2024-05-02 NOTE — Telephone Encounter (Signed)
 [id number]

## 2024-05-03 NOTE — Telephone Encounter (Signed)
 Pharmacy Patient Advocate Encounter  Received notification from [facility] that Prior Authorization for Mounjaro  2.5mg  has been APPROVED from 05/02/24 to 05/02/25

## 2024-05-23 DIAGNOSIS — M5414 Radiculopathy, thoracic region: Secondary | ICD-10-CM | POA: Diagnosis not present

## 2024-05-23 DIAGNOSIS — M9902 Segmental and somatic dysfunction of thoracic region: Secondary | ICD-10-CM | POA: Diagnosis not present

## 2024-05-23 DIAGNOSIS — M9901 Segmental and somatic dysfunction of cervical region: Secondary | ICD-10-CM | POA: Diagnosis not present

## 2024-05-23 DIAGNOSIS — M5413 Radiculopathy, cervicothoracic region: Secondary | ICD-10-CM | POA: Diagnosis not present

## 2024-05-24 DIAGNOSIS — M5414 Radiculopathy, thoracic region: Secondary | ICD-10-CM | POA: Diagnosis not present

## 2024-05-24 DIAGNOSIS — M9902 Segmental and somatic dysfunction of thoracic region: Secondary | ICD-10-CM | POA: Diagnosis not present

## 2024-05-24 DIAGNOSIS — M5413 Radiculopathy, cervicothoracic region: Secondary | ICD-10-CM | POA: Diagnosis not present

## 2024-05-24 DIAGNOSIS — M9901 Segmental and somatic dysfunction of cervical region: Secondary | ICD-10-CM | POA: Diagnosis not present

## 2024-06-08 DIAGNOSIS — M5412 Radiculopathy, cervical region: Secondary | ICD-10-CM | POA: Diagnosis not present

## 2024-06-09 ENCOUNTER — Encounter: Payer: Self-pay | Admitting: Internal Medicine

## 2024-06-09 MED ORDER — TIRZEPATIDE 2.5 MG/0.5ML ~~LOC~~ SOAJ: 2.5000 mg | SUBCUTANEOUS | 3 refills | Status: DC

## 2024-06-16 DIAGNOSIS — E291 Testicular hypofunction: Secondary | ICD-10-CM | POA: Diagnosis not present

## 2024-06-23 DIAGNOSIS — M5412 Radiculopathy, cervical region: Secondary | ICD-10-CM | POA: Diagnosis not present

## 2024-07-05 DIAGNOSIS — M5412 Radiculopathy, cervical region: Secondary | ICD-10-CM | POA: Diagnosis not present

## 2024-07-21 DIAGNOSIS — M5412 Radiculopathy, cervical region: Secondary | ICD-10-CM | POA: Diagnosis not present

## 2024-08-22 ENCOUNTER — Encounter: Payer: Self-pay | Admitting: Internal Medicine

## 2024-08-22 ENCOUNTER — Ambulatory Visit (INDEPENDENT_AMBULATORY_CARE_PROVIDER_SITE_OTHER): Admitting: Internal Medicine

## 2024-08-22 VITALS — BP 128/74 | HR 84 | Ht 76.0 in | Wt 254.2 lb

## 2024-08-22 DIAGNOSIS — Z024 Encounter for examination for driving license: Secondary | ICD-10-CM

## 2024-08-22 NOTE — Progress Notes (Signed)
 Commercial Driver Medical Examination   Tanner Frye is a 48 y.o. male who presents today for a commercial driver fitness determination physical exam. The patient reports no problems. The following portions of the patient's history were reviewed and updated as appropriate: allergies, current medications, past family history, past medical history, past social history, past surgical history, and problem list. Review of Systems  Past Medical History:  Diagnosis Date   Allergy    Anal fissure    Diabetes mellitus without complication (HCC) 2021   Family history of colorectal cancer    GERD (gastroesophageal reflux disease)    Hypertension 2021   Mixed hyperlipidemia 09/23/2022   Personal history of colonic polyps    Type 2 diabetes, controlled, with peripheral neuropathy (HCC)     Current Outpatient Medications  Medication Sig Dispense Refill   amLODipine  (NORVASC ) 5 MG tablet Take 1 tablet (5 mg total) by mouth daily. 90 tablet 3   ANASTROZOLE PO      Ascorbic Acid (VITAMIN C) 1000 MG tablet Take 1,000 mg by mouth daily.     aspirin 81 MG chewable tablet      atorvastatin  (LIPITOR) 40 MG tablet Take 1 tablet (40 mg total) by mouth daily. 90 tablet 3   Blood Glucose Monitoring Suppl (FREESTYLE LITE) DEVI Use to check blood sugar up to twice a day as advised. 1 each 0   Enclomiphene Citrate POWD      EPINEPHrine  (EPIPEN  2-PAK) 0.3 mg/0.3 mL IJ SOAJ injection Inject 0.3 mg into the muscle as needed for anaphylaxis. 1 each 1   FREESTYLE LITE test strip Use to check blood sugar up to twice a day as advised. 200 each 3   hydrochlorothiazide  (HYDRODIURIL ) 25 MG tablet Take 1 tablet (25 mg total) by mouth daily. 90 tablet 3   Lancets (FREESTYLE) lancets Use to check blood sugar up to twice a day as advised. 100 each 12   Loratadine 10 MG CAPS      losartan  (COZAAR ) 100 MG tablet Take 1 tablet (100 mg total) by mouth daily. 90 tablet 3   Multiple Vitamin (MULTI-VITAMIN) tablet       Semaglutide ,0.25 or 0.5MG /DOS, 2 MG/3ML SOPN Inject 0.5 mg under the skin every week. 9 mL 1   Testosterone  Cypionate 200 MG/ML SOLN      tirzepatide  (MOUNJARO ) 2.5 MG/0.5ML Pen Inject 2.5 mg into the skin once a week. 6 mL 3   No current facility-administered medications for this visit.    Allergies  Allergen Reactions   Bee Venom    Oatmeal Swelling   Other Swelling    Processed foods that are baked    Family History  Problem Relation Age of Onset   Hypertension Mother    Hyperlipidemia Mother    Rectal cancer Brother 107   Cancer Brother    Rectal cancer Paternal Aunt    Colon cancer Paternal Uncle    Prostate cancer Neg Hx     Social History   Socioeconomic History   Marital status: Married    Spouse name: Not on file   Number of children: Not on file   Years of education: Vocational Degree   Highest education level: Some college, no degree  Occupational History   Occupation: Psychologist, sport and exercise  Tobacco Use   Smoking status: Never   Smokeless tobacco: Never  Vaping Use   Vaping status: Never Used  Substance and Sexual Activity   Alcohol use: Yes    Alcohol/week: 0.0 standard drinks of  alcohol    Comment: Wine or beer 1 -2  once or twice a week.   Drug use: No   Sexual activity: Yes    Partners: Female    Birth control/protection: Surgical  Other Topics Concern   Not on file  Social History Narrative   Right Handed    Lives in a two story home   Social Drivers of Health   Financial Resource Strain: Low Risk  (03/29/2024)   Overall Financial Resource Strain (CARDIA)    Difficulty of Paying Living Expenses: Not hard at all  Food Insecurity: No Food Insecurity (03/29/2024)   Hunger Vital Sign    Worried About Running Out of Food in the Last Year: Never true    Ran Out of Food in the Last Year: Never true  Transportation Needs: No Transportation Needs (03/29/2024)   PRAPARE - Administrator, Civil Service (Medical): No    Lack of Transportation  (Non-Medical): No  Physical Activity: Sufficiently Active (03/29/2024)   Exercise Vital Sign    Days of Exercise per Week: 4 days    Minutes of Exercise per Session: 60 min  Stress: No Stress Concern Present (03/29/2024)   [employer] of Occupational Health - Occupational Stress Questionnaire    Feeling of Stress : Not at all  Social Connections: Moderately Isolated (03/29/2024)   Social Connection and Isolation Panel    Frequency of Communication with Friends and Family: Twice a week    Frequency of Social Gatherings with Friends and Family: Twice a week    Attends Religious Services: Never    Database Administrator or Organizations: No    Attends Engineer, Structural: Not on file    Marital Status: Married  Catering Manager Violence: Not on file     Constitutional: Denies fever, malaise, fatigue, headache or abrupt weight changes.  HEENT: Denies eye pain, eye redness, ear pain, ringing in the ears, wax buildup, runny nose, nasal congestion, bloody nose, or sore throat. Respiratory: Denies difficulty breathing, shortness of breath, cough or sputum production.   Cardiovascular: Denies chest pain, chest tightness, palpitations or swelling in the hands or feet.  Gastrointestinal: Denies abdominal pain, bloating, constipation, diarrhea or blood in the stool.  GU: Denies urgency, frequency, pain with urination, burning sensation, blood in urine, odor or discharge. Musculoskeletal: Patient reports neck pain.  Denies decrease in range of motion, difficulty with gait, muscle pain or joint swelling.  Skin: Denies redness, rashes, lesions or ulcercations.  Neurological: Denies dizziness, difficulty with memory, difficulty with speech or problems with balance and coordination.  Psych: Denies anxiety, depression, SI/HI.  No other specific complaints in a complete review of systems (except as listed in HPI above). 70  Objective:    Vision:  Uncorrected Corrected Horizontal Field  of Vision  Right Eye  20/20 70 degrees  Left Eye  20/40  70 degrees  Both Eyes   20/20    Applicant can recognize and distinguish among traffic control signals and devices showing standard red, green, and amber colors.  Applicant meets visual acuity requirement only when wearing corrective lenses.  Monocular Vision?:No   Hearing:        Right Ear  > 5 ft     Left Ear  > 65ft        BP 128/74 (BP Location: Left Arm, Patient Position: Sitting, Cuff Size: Large)   Pulse 84   Ht 6' 4 (1.93 m)   Wt 254 lb 3.2 oz (  115.3 kg)   SpO2 97%   BMI 30.94 kg/m   Wt Readings from Last 3 Encounters:  04/07/24 264 lb 9.6 oz (120 kg)  03/30/24 264 lb (119.7 kg)  01/26/24 269 lb (122 kg)    General: Appears his stated age, Pauline, in NAD. Skin: Warm, dry and intact. No rashes, lesions or ulcerations noted. HEENT: Head: normal shape and size; Eyes: sclera white, no icterus, conjunctiva pink, PERRLA and EOMs intact; Ears: Tm's gray and intact, normal light reflex; Nose: mucosa pink and moist, septum midline; Throat/Mouth: Teeth present, mucosa pink and moist, no exudate, lesions or ulcerations noted.  Neck:  Neck supple, trachea midline. No masses, lumps or thyromegaly present.  Cardiovascular: Normal rate and rhythm. S1,S2 noted.  No murmur, rubs or gallops noted. No JVD or BLE edema. No carotid bruits noted. Pulmonary/Chest: Normal effort and positive vesicular breath sounds. No respiratory distress. No wheezes, rales or ronchi noted.  Abdomen: Soft and nontender. Normal bowel sounds. No distention or masses noted. Liver, spleen and kidneys non palpable. Musculoskeletal: Normal range of motion. No signs of joint swelling.  Strength 5/5 BUE/BLE.  No difficulty with gait.  Neurological: Alert and oriented. Cranial nerves II-XII grossly intact. Coordination normal.  Psychiatric: Mood and affect normal. Behavior is normal. Judgment and thought content normal.     BMET    Component Value  Date/Time   NA 137 03/30/2024 0847   NA 139 06/19/2014 1433   K 4.9 03/30/2024 0847   CL 102 03/30/2024 0847   CO2 28 03/30/2024 0847   GLUCOSE 103 (H) 03/30/2024 0847   BUN 13 03/30/2024 0847   BUN 11 06/19/2014 1433   CREATININE 1.08 03/30/2024 0847   CALCIUM  9.5 03/30/2024 0847   GFRNONAA >60 08/09/2023 1540   GFRNONAA 106 09/09/2020 0841   GFRAA 123 09/09/2020 0841    Lipid Panel     Component Value Date/Time   CHOL 179 12/28/2023 0941   TRIG 360 (H) 12/28/2023 0941   HDL 45 12/28/2023 0941   CHOLHDL 4.0 12/28/2023 0941   CHOLHDL 4.6 03/31/2023 0934   VLDL 56.0 (H) 03/18/2022 0917   LDLCALC 76 12/28/2023 0941   LDLCALC 128 (H) 03/31/2023 0934    CBC    Component Value Date/Time   WBC 6.8 03/30/2024 0847   RBC 4.72 03/30/2024 0847   HGB 13.1 (L) 03/30/2024 0847   HGB 16.0 11/30/2013 1228   HCT 40.9 03/30/2024 0847   HCT 46.9 11/30/2013 1228   PLT 280 03/30/2024 0847   PLT 287 11/30/2013 1228   MCV 86.7 03/30/2024 0847   MCV 86 11/30/2013 1228   MCH 27.8 03/30/2024 0847   MCHC 32.0 03/30/2024 0847   RDW 13.2 03/30/2024 0847   RDW 14.1 06/19/2014 1433   RDW 13.3 11/30/2013 1228   LYMPHSABS 2,444 03/31/2023 0934   LYMPHSABS 3.4 (H) 06/19/2014 1433   LYMPHSABS 3.0 11/30/2013 1228   MONOABS 0.7 07/27/2018 1332   MONOABS 0.7 11/30/2013 1228   EOSABS 88 03/30/2024 0847   EOSABS 0.2 06/19/2014 1433   EOSABS 0.2 11/30/2013 1228   BASOSABS 48 03/30/2024 0847   BASOSABS 0.0 06/19/2014 1433   BASOSABS 0.1 11/30/2013 1228    Hgb A1C Lab Results  Component Value Date   HGBA1C 5.6 03/30/2024        Labs:  Urinalysis:  Specific gravity: 1.005  Protein: Negative  Glucose: Negative  Blood: Negative  Assessment:    Healthy male exam.  Meets standards, but periodic monitoring required  due to HTN, DM 2.  Driver qualified only for 1 year.    Plan:    Medical examiners certificate completed and printed. Return as needed.    Angeline Laura, NP

## 2024-08-28 DIAGNOSIS — R6882 Decreased libido: Secondary | ICD-10-CM | POA: Diagnosis not present

## 2024-08-28 DIAGNOSIS — R5382 Chronic fatigue, unspecified: Secondary | ICD-10-CM | POA: Diagnosis not present

## 2024-08-28 DIAGNOSIS — Z6830 Body mass index (BMI) 30.0-30.9, adult: Secondary | ICD-10-CM | POA: Diagnosis not present

## 2024-08-28 DIAGNOSIS — E291 Testicular hypofunction: Secondary | ICD-10-CM | POA: Diagnosis not present

## 2024-10-03 ENCOUNTER — Encounter: Payer: Self-pay | Admitting: Internal Medicine

## 2024-10-03 ENCOUNTER — Ambulatory Visit: Admitting: Internal Medicine

## 2024-10-03 ENCOUNTER — Other Ambulatory Visit

## 2024-10-03 VITALS — BP 120/70 | HR 82 | Ht 76.0 in | Wt 252.8 lb

## 2024-10-03 DIAGNOSIS — E782 Mixed hyperlipidemia: Secondary | ICD-10-CM

## 2024-10-03 DIAGNOSIS — E1142 Type 2 diabetes mellitus with diabetic polyneuropathy: Secondary | ICD-10-CM

## 2024-10-03 DIAGNOSIS — G6289 Other specified polyneuropathies: Secondary | ICD-10-CM

## 2024-10-03 LAB — POCT GLYCOSYLATED HEMOGLOBIN (HGB A1C): Hemoglobin A1C: 5.7 % — AB (ref 4.0–5.6)

## 2024-10-03 MED ORDER — TIRZEPATIDE 5 MG/0.5ML ~~LOC~~ SOAJ: 5.0000 mg | SUBCUTANEOUS | 5 refills | Status: DC

## 2024-10-03 NOTE — Progress Notes (Signed)
 Patient ID: MATTHE SLOANE, male   DOB: 1976/02/08, 48 y.o.   MRN: [id number]   HPI: MANU RUBEY is a 48 y.o.-year-old male, initially referred by his PCP, Edman Marsa PARAS, DO, returning for follow-up for DM2, dx in 2021, controlled, with complications (peripheral neuropathy).  Last visit 6 months ago.  Interim hx: No increased urination, blurry vision, nausea, chest pain. He is not exercising - neck pain. At last visit we switched from Ozempic  to Mounjaro  low-dose.  Since then, since insurance stopped covering this dose of Mounjaro  after 1 refill, he switched to compounded Retatrutide obtained from in [place] - $$$ 800/mo. he would like to stop this as it is too expensive.  Reviewed HbA1c: Lab Results  Component Value Date   HGBA1C 5.6 03/30/2024   HGBA1C 5.8 (A) 04/07/2023   HGBA1C 5.0 09/23/2022   HGBA1C 5.3 03/18/2022   HGBA1C 5.8 (A) 08/26/2021   HGBA1C 5.5 02/19/2021   HGBA1C 6.2 (A) 11/20/2020   HGBA1C 6.0 (A) 08/01/2020   HGBA1C 8.9 (H) 01/26/2020   HGBA1C 6.8 (H) 03/29/2019   He was previously on Ozempic  0.5 mg weekly - started 08/2021 >> 1 mg weekly >> stopped 12/2022 losing muscle and strength.  However, he wanted to retry this - restarted 11/2023: 0.25 >> 0.5 mg weekly.  In 03/2024, he wanted to try a stronger medication for weight loss so we started Mounjaro  2.5 mg weekly.  He continues on this.  He was also previously on metformin  500 mg 2x a day with meals but he felt this was not working well for him.  Pt checks his sugars 0-1x a day: - am: 80s-100 >> 100-120 >> 100-110 >> 100 >> 114-120, 140 - 2h after b'fast:139 >> n/c >> 130 >> n/c >> 130-140 >> n/c - before lunch: 123 >> 90s >> n/c >> 100-110 >> 100 >> 100-105 - 2h after lunch: n/c >> 101 >> n/c >> 130 >> n/c  - before dinner:  less than 100 >> 80 >> 80-100 >>  80's-90s if skips lunch - 2h after dinner: n/c >> 105 >> 115-120 >> n/c >> n/c - bedtime: n/c >> 96 >> n/c >> 90s-105  >> n/c  - nighttime:  n/c Lowest sugar was  75 >> 80 >> 80s; it is unclear if he has hypoglycemia awareness. Highest sugar was 139 >> 120 >> 110 >> 140.  Glucometer: Freestyle lite  Pt's meals are: - Breakfast: 2 sugar-free jello cups + 2 eggs + 2 pieces of bacon >> oatmeal >> 2 eggs - Lunch: prev. Fast food >> now home cooked: chicken + veggies - Dinner: same - Snacks: no desserts, apples, carrots, crackers. No sweet drinks, but water, unsweet tea. Used to drink OJ. He [facility] in the past -he felt that this was helpful.  - no CKD, last BUN/creatinine:  Lab Results  Component Value Date   BUN 13 03/30/2024   BUN 15 08/09/2023   CREATININE 1.08 03/30/2024   CREATININE 1.21 08/09/2023   Lab Results  Component Value Date   MICRALBCREAT 2 10/08/2023  On losartan  100.  -+ HL; last set of lipids: Lab Results  Component Value Date   CHOL 179 12/28/2023   HDL 45 12/28/2023   LDLCALC 76 12/28/2023   LDLDIRECT 103 (H) 12/28/2023   TRIG 360 (H) 12/28/2023   CHOLHDL 4.0 12/28/2023  Started pravastatin  20 mg daily 09/2022.  This was increased to 40 mg daily in 09/2023. Of note, he had a coronary  calcium  score of 0.  - last eye exam was in 07/2024: No DR reportedly.  - + numbness and tingling in his feet. No pain. Started mid 2021.  Last foot exam 03/30/2024.  At that time, it was considered that his neuropathy was related to diabetes. Since he only had numbness at that time, no treatment recommendation was made since none of the available medications help in the absence of pain. Alpha-lipoic acid twice a day Lyrica  did not help significantly. Also, L-me-folate did not help.  Exercise helps and his symptoms are increased if he takes a break from exercising for more than 2 to 3 days.   Was on Cayenne fruit >> helped the most but had to stop due to GERD. At last visit, he was using Lions mane and Cordyceps >> stopped due to headaches.  Retrospectively, these did not help too much.  Pt has FH of  prediabetes in [place].  He also has a history of transaminitis (increased ALT) and also HTN.  At last visit, we investigated his peripheral neuropathy-B6 vitamin was high, but normalized after stopping multivitamins: Office Visit on 02/19/2021  Component Date Value Ref Range Status   Vitamin B6 02/19/2021 15.0  2.1 - 21.7 ng/mL Final   Office Visit on 11/20/2020  Component Date Value Ref Range Status   TSH 11/20/2020 1.98  0.35 - 4.50 uIU/mL Final   Vitamin B-12 11/20/2020 537  211 - 911 pg/mL Final   Vitamin B6 11/20/2020 42.0* 2.1 - 21.7 ng/mL Final   Ferritin 11/20/2020 49.9  22.0 - 322.0 ng/mL Final   He is on testosterone  supplementation by [facility] MD. also on anastrozole.  His Hb was previously elevated. He donates blood.  ROS: + see HPI  I reviewed pt's medications, allergies, PMH, social hx, family hx, and changes were documented in the history of present illness. Otherwise, unchanged from my initial visit note.  Past Medical History:  Diagnosis Date   Allergy    Anal fissure    Diabetes mellitus without complication (HCC) 2021   Family history of colorectal cancer    GERD (gastroesophageal reflux disease)    Hypertension 2021   Mixed hyperlipidemia 09/23/2022   Personal history of colonic polyps    Type 2 diabetes, controlled, with peripheral neuropathy [facility])    Past Surgical History:  Procedure Laterality Date   COLONOSCOPY WITH PROPOFOL  N/A 04/27/2017   Procedure: COLONOSCOPY WITH PROPOFOL ;  Surgeon: Therisa Bi, MD;  Location: [facility] ENDOSCOPY;  Service: Endoscopy;  Laterality: N/A;   COLONOSCOPY WITH PROPOFOL  N/A 06/21/2020   Procedure: COLONOSCOPY WITH PROPOFOL ;  Surgeon: Therisa Bi, MD;  Location: [facility] ENDOSCOPY;  Service: Gastroenterology;  Laterality: N/A;   COLONOSCOPY WITH PROPOFOL  N/A 04/24/2022   Procedure: COLONOSCOPY WITH PROPOFOL ;  Surgeon: Therisa Bi, MD;  Location: [facility] ENDOSCOPY;  Service: Gastroenterology;  Laterality: N/A;   HERNIA REPAIR  1980   Social  History   Socioeconomic History   Marital status: Married    Spouse name: Not on file   Number of children: Not on file   Years of education: Vocational Degree   Highest education level: Some college, no degree  Occupational History   Occupation: Psychologist, sport and exercise  Tobacco Use   Smoking status: Never   Smokeless tobacco: Never  Vaping Use   Vaping status: Never Used  Substance and Sexual Activity   Alcohol use: Yes    Alcohol/week: 0.0 standard drinks of alcohol    Comment: Wine or beer 1 -2  once or twice  a week.   Drug use: No   Sexual activity: Yes    Partners: Female    Birth control/protection: Surgical  Other Topics Concern   Not on file  Social History Narrative   Right Handed    Lives in a two story home   Social Drivers of Health   Tobacco Use: Low Risk (08/22/2024)   Patient History    Smoking Tobacco Use: Never    Smokeless Tobacco Use: Never    Passive Exposure: Not on file  Financial Resource Strain: Low Risk (03/29/2024)   Overall Financial Resource Strain (CARDIA)    Difficulty of Paying Living Expenses: Not hard at all  Food Insecurity: No Food Insecurity (03/29/2024)   Hunger Vital Sign    Worried About Running Out of Food in the Last Year: Never true    Ran Out of Food in the Last Year: Never true  Transportation Needs: No Transportation Needs (03/29/2024)   PRAPARE - Administrator, Civil Service (Medical): No    Lack of Transportation (Non-Medical): No  Physical Activity: Sufficiently Active (03/29/2024)   Exercise Vital Sign    Days of Exercise per Week: 4 days    Minutes of Exercise per Session: 60 min  Stress: No Stress Concern Present (03/29/2024)   [employer] of Occupational Health - Occupational Stress Questionnaire    Feeling of Stress : Not at all  Social Connections: Moderately Isolated (03/29/2024)   Social Connection and Isolation Panel    Frequency of Communication with Friends and Family: Twice a week    Frequency of  Social Gatherings with Friends and Family: Twice a week    Attends Religious Services: Never    Database Administrator or Organizations: No    Attends Engineer, Structural: Not on file    Marital Status: Married  Catering Manager Violence: Not on file  Depression (PHQ2-9): Low Risk (03/30/2024)   Depression (PHQ2-9)    PHQ-2 Score: 0  Alcohol Screen: Low Risk (03/29/2024)   Alcohol Screen    Last Alcohol Screening Score (AUDIT): 1  Housing: Low Risk (03/29/2024)   Housing Stability Vital Sign    Unable to Pay for Housing in the Last Year: No    Number of Times Moved in the Last Year: 0    Homeless in the Last Year: No  Utilities: Not on file  Health Literacy: Not on file   Current Outpatient Medications on File Prior to Visit  Medication Sig Dispense Refill   amLODipine  (NORVASC ) 5 MG tablet Take 1 tablet (5 mg total) by mouth daily. 90 tablet 3   ANASTROZOLE PO      Ascorbic Acid (VITAMIN C) 1000 MG tablet Take 1,000 mg by mouth daily.     aspirin 81 MG chewable tablet      atorvastatin  (LIPITOR) 40 MG tablet Take 1 tablet (40 mg total) by mouth daily. (Patient not taking: Reported on 08/22/2024) 90 tablet 3   Blood Glucose Monitoring Suppl (FREESTYLE LITE) DEVI Use to check blood sugar up to twice a day as advised. 1 each 0   cyclobenzaprine  (FLEXERIL ) 10 MG tablet Take 10 mg by mouth 2 (two) times daily as needed.     Enclomiphene Citrate POWD      EPINEPHrine  (EPIPEN  2-PAK) 0.3 mg/0.3 mL IJ SOAJ injection Inject 0.3 mg into the muscle as needed for anaphylaxis. 1 each 1   FREESTYLE LITE test strip Use to check blood sugar up to twice a day as  advised. 200 each 3   hydrochlorothiazide  (HYDRODIURIL ) 25 MG tablet Take 1 tablet (25 mg total) by mouth daily. 90 tablet 3   Lancets (FREESTYLE) lancets Use to check blood sugar up to twice a day as advised. 100 each 12   Loratadine 10 MG CAPS      losartan  (COZAAR ) 100 MG tablet Take 1 tablet (100 mg total) by mouth daily. 90  tablet 3   Multiple Vitamin (MULTI-VITAMIN) tablet      Testosterone  Cypionate 200 MG/ML SOLN      tirzepatide  (MOUNJARO ) 2.5 MG/0.5ML Pen Inject 2.5 mg into the skin once a week. 6 mL 3   No current facility-administered medications on file prior to visit.   Allergies  Allergen Reactions   Bee Venom    Oatmeal Swelling   Other Swelling    Processed foods that are baked   Family History  Problem Relation Age of Onset   Hypertension Mother    Hyperlipidemia Mother    Rectal cancer Brother 30   Cancer Brother    Rectal cancer Paternal Aunt    Colon cancer Paternal Uncle    Prostate cancer Neg Hx    PE: BP 120/70   Pulse 82   Ht 6' 4 (1.93 m)   Wt 252 lb 12.8 oz (114.7 kg)   SpO2 97%   BMI 30.77 kg/m  Wt Readings from Last 10 Encounters:  10/03/24 252 lb 12.8 oz (114.7 kg)  08/22/24 254 lb 3.2 oz (115.3 kg)  04/07/24 264 lb 9.6 oz (120 kg)  03/30/24 264 lb (119.7 kg)  01/26/24 269 lb (122 kg)  12/28/23 268 lb 12.8 oz (121.9 kg)  11/23/23 263 lb (119.3 kg)  10/08/23 264 lb 9.6 oz (120 kg)  09/23/23 257 lb 3.2 oz (116.7 kg)  08/11/23 254 lb (115.2 kg)   Constitutional: Muscular, in NAD Eyes: EOMI, no exophthalmos ENT: no thyromegaly, no cervical lymphadenopathy Cardiovascular: RRR, No MRG Respiratory: CTA B Musculoskeletal: no deformities Skin: no rashes Neurological: no tremor with outstretched hands  ASSESSMENT: 1. DM2, non-insulin -dependent, controlled, controlled, with complications - PN  2. HL  3.  Peripheral neuropathy  4. Obesity class 2  PLAN:  1. Patient with history of prediabetes, but with a diagnosis of diabetes in 2021.  At that time, HbA1c returned elevated, at 8.9%, but blood sugars improved afterwards to a nadir HbA1c of 5.0%.  Afterwards, HbA1c increased, but at last visit it was still excellent, at 5.6%.  He was on Ozempic , which he tolerated well but he did not lose weight.  He was interested in a stronger medication so we switched to  Mounjaro  at last visit.  We started at the lowest dose and I advised him to let me know if he tolerated it well, so we could increase the dose.  He did lose 12 pounds after starting it. -At today's visit, sugars remain well-controlled, without hyperglycemic spikes or hypoglycemic excursions.  He is doing well on Retatrutide compounded, but this is too expensive for him.  We discussed that the insurance did not cover the 2.5 mg of Mounjaro  dose most likely due to the fact that this dose is only recommended when starting Mounjaro , not for chronic use.  We did discuss about increasing the dose to 5 mg weekly and I am hoping that this is covered for him.  He agrees with this. - I suggested to:  Patient Instructions  Please use: - Mounjaro  5 mg weekly  Please come back for a follow-up  appointment in 6 months.  - we checked his HbA1c: 5.7% (excellent) - advised to check sugars at different times of the day - 1x a day, rotating check times - advised for yearly eye exams >> he is UTD - return to clinic in 6 months  2. HL - Latest lipid panel showed high triglycerides.  The calculated LDL was only slightly higher than goal at 76: Lab Results  Component Value Date   CHOL 179 12/28/2023   HDL 45 12/28/2023   LDLCALC 76 12/28/2023   LDLDIRECT 103 (H) 12/28/2023   TRIG 360 (H) 12/28/2023   CHOLHDL 4.0 12/28/2023  - He was on atorvastatin  40 mg daily with good tolerance, now off.  I also previously suggested adding omega-3 fatty acids 1000 mg twice a day.  3. Peripheral neuropathy -Developed around the time when his diabetes became uncontrolled but his diabetes control improved afterwards with persistent neuropathy symptoms. He described no sensation in the front part of his sole and increase sensitivity mid foot. -Of note, his brother also developed neuropathy, but after chemotherapy -Symptoms improve with exercising  -Usually he has numbness, but without discomfort -He tried gabapentin , alpha  lipoid acid, L-methylfolate without any improvement in symptoms.  We tried Lyrica  but this did not work for him. -B6 level was high so we stopped his multivitamins but then normalized B6 level afterwards did not help with symptoms -B12 vitamin and ferritin levels were normal -No history of excess alcohol or toxic exposures. -He started to see neurology-Dr. Tobie.  His neuropathy was still deemed to be due to diabetes despite the mild nature of his diabetes.  Since he only had some numbness, only conservative measures were recommended. - In 2024, he started to use cayenne fruit, and also [employer] and Cordyceps. He came off the mushrooms due to headaches, though.  He did mention that cayenne fruit helps the most but he had to stop due to GERD.  I recommended capsaicin cream.  4. Obesity class 2 - Previously on Ozempic  - he lost 12 lbs on Retatrutide recently.  This is too expensive so we will switch to Mounjaro  at a higher dose. - He tolerated the Mounjaro  and the Retatrutide will  Lela Fendt, MD PhD [facility]

## 2024-10-03 NOTE — Patient Instructions (Addendum)
 Please use: - Mounjaro  5 mg weekly  Please come back for a follow-up appointment in 6 months.

## 2024-10-04 ENCOUNTER — Ambulatory Visit: Payer: Self-pay | Admitting: Internal Medicine

## 2024-10-04 LAB — MICROALBUMIN / CREATININE URINE RATIO
Creatinine, Urine: 183 mg/dL (ref 20–320)
Microalb Creat Ratio: 2 mg/g{creat} (ref <30)
Microalb, Ur: 0.3 mg/dL

## 2024-10-16 ENCOUNTER — Encounter: Payer: Self-pay | Admitting: Internal Medicine

## 2024-10-18 MED ORDER — TIRZEPATIDE 5 MG/0.5ML ~~LOC~~ SOAJ: 5.0000 mg | SUBCUTANEOUS | 5 refills | Status: AC

## 2025-04-03 ENCOUNTER — Encounter: Admitting: Family Medicine

## 2025-04-04 ENCOUNTER — Ambulatory Visit: Admitting: Internal Medicine
